# Patient Record
Sex: Female | Born: 1940
Health system: Southern US, Community
[De-identification: ages and names within clinical notes are randomized; demographics above are authoritative.]

## PROBLEM LIST (undated history)

## (undated) DIAGNOSIS — R42 Dizziness and giddiness: Secondary | ICD-10-CM

## (undated) DIAGNOSIS — J302 Other seasonal allergic rhinitis: Secondary | ICD-10-CM

## (undated) DIAGNOSIS — R251 Tremor, unspecified: Secondary | ICD-10-CM

## (undated) DIAGNOSIS — M1990 Unspecified osteoarthritis, unspecified site: Secondary | ICD-10-CM

## (undated) DIAGNOSIS — H8110 Benign paroxysmal vertigo, unspecified ear: Secondary | ICD-10-CM

## (undated) HISTORY — DX: Benign paroxysmal vertigo, unspecified ear: H81.10

## (undated) HISTORY — PX: OTHER SURGICAL HISTORY: SHX169

## (undated) HISTORY — PX: BACK SURGERY: SHX140

## (undated) HISTORY — DX: Tremor, unspecified: R25.1

---

## 1975-06-18 HISTORY — PX: ABDOMINAL HYSTERECTOMY: SHX81

## 1998-06-15 ENCOUNTER — Ambulatory Visit (HOSPITAL_COMMUNITY): Admission: RE | Admit: 1998-06-15 | Discharge: 1998-06-15 | Payer: Self-pay | Admitting: Family Medicine

## 1998-06-15 ENCOUNTER — Encounter: Payer: Self-pay | Admitting: Family Medicine

## 2000-05-20 ENCOUNTER — Other Ambulatory Visit: Admission: RE | Admit: 2000-05-20 | Discharge: 2000-05-20 | Payer: Self-pay | Admitting: Gynecology

## 2001-09-02 ENCOUNTER — Other Ambulatory Visit: Admission: RE | Admit: 2001-09-02 | Discharge: 2001-09-02 | Payer: Self-pay | Admitting: Gynecology

## 2002-09-13 ENCOUNTER — Other Ambulatory Visit: Admission: RE | Admit: 2002-09-13 | Discharge: 2002-09-13 | Payer: Self-pay | Admitting: Gynecology

## 2003-01-24 ENCOUNTER — Ambulatory Visit (HOSPITAL_COMMUNITY): Admission: RE | Admit: 2003-01-24 | Discharge: 2003-01-24 | Payer: Self-pay | Admitting: *Deleted

## 2003-09-14 ENCOUNTER — Other Ambulatory Visit: Admission: RE | Admit: 2003-09-14 | Discharge: 2003-09-14 | Payer: Self-pay | Admitting: Gynecology

## 2004-04-30 ENCOUNTER — Ambulatory Visit (HOSPITAL_BASED_OUTPATIENT_CLINIC_OR_DEPARTMENT_OTHER): Admission: RE | Admit: 2004-04-30 | Discharge: 2004-04-30 | Payer: Self-pay | Admitting: Orthopedic Surgery

## 2004-04-30 ENCOUNTER — Ambulatory Visit (HOSPITAL_COMMUNITY): Admission: RE | Admit: 2004-04-30 | Discharge: 2004-04-30 | Payer: Self-pay | Admitting: Orthopedic Surgery

## 2004-08-30 ENCOUNTER — Emergency Department (HOSPITAL_COMMUNITY): Admission: EM | Admit: 2004-08-30 | Discharge: 2004-08-30 | Payer: Self-pay | Admitting: Emergency Medicine

## 2004-09-17 ENCOUNTER — Other Ambulatory Visit: Admission: RE | Admit: 2004-09-17 | Discharge: 2004-09-17 | Payer: Self-pay | Admitting: Gynecology

## 2005-07-15 ENCOUNTER — Ambulatory Visit (HOSPITAL_BASED_OUTPATIENT_CLINIC_OR_DEPARTMENT_OTHER): Admission: RE | Admit: 2005-07-15 | Discharge: 2005-07-15 | Payer: Self-pay | Admitting: Orthopedic Surgery

## 2005-09-09 ENCOUNTER — Other Ambulatory Visit: Admission: RE | Admit: 2005-09-09 | Discharge: 2005-09-09 | Payer: Self-pay | Admitting: Gynecology

## 2006-09-29 ENCOUNTER — Other Ambulatory Visit: Admission: RE | Admit: 2006-09-29 | Discharge: 2006-09-29 | Payer: Self-pay | Admitting: Gynecology

## 2007-05-27 ENCOUNTER — Encounter
Admission: RE | Admit: 2007-05-27 | Discharge: 2007-05-27 | Payer: Self-pay | Admitting: Physical Medicine and Rehabilitation

## 2007-07-31 ENCOUNTER — Encounter (INDEPENDENT_AMBULATORY_CARE_PROVIDER_SITE_OTHER): Payer: Self-pay | Admitting: Orthopedic Surgery

## 2007-07-31 ENCOUNTER — Ambulatory Visit (HOSPITAL_BASED_OUTPATIENT_CLINIC_OR_DEPARTMENT_OTHER): Admission: RE | Admit: 2007-07-31 | Discharge: 2007-07-31 | Payer: Self-pay | Admitting: Orthopedic Surgery

## 2008-02-01 ENCOUNTER — Encounter: Admission: RE | Admit: 2008-02-01 | Discharge: 2008-02-01 | Payer: Self-pay | Admitting: Family Medicine

## 2008-04-06 ENCOUNTER — Encounter: Admission: RE | Admit: 2008-04-06 | Discharge: 2008-04-14 | Payer: Self-pay | Admitting: Obstetrics and Gynecology

## 2010-10-30 NOTE — Op Note (Signed)
NAME:  [name], [name]                  ACCOUNT NO.:  [id number]   MEDICAL RECORD NO.:  [id number]          PATIENT TYPE:  AMB   LOCATION:  DSC                          FACILITY:  [facility]   PHYSICIAN:  Katy Fitch. Sypher, M.D. DATE OF BIRTH:  03-13-1941   DATE OF PROCEDURE:  07/31/2007  DATE OF DISCHARGE:                               OPERATIVE REPORT   PREOPERATIVE DIAGNOSIS:  Chronic lytic nail deformity, right thumb, with  arthritis of interphalangeal joint and presumed chronic mucoid cyst.   POSTOPERATIVE DIAGNOSES:  1. Chronic lytic nail deformity with evidence of pyogenic      granuloma/ruptured mucoid cyst deep to nail fold causing damage to      germinal nail matrix and intermediate nail fold  2. Severe osteoarthritis of interphalangeal joints with marginal      osteophytes and synovitis.   OPERATION:  1. Removal of nail plate, debridement of ruptured mucoid cyst/pyogenic      granuloma and nail fold biopsy.  2. Interphalangeal joint debridement, removal of osteophytes and      synovectomy.   OPERATING SURGEON:  Josephine Igo, MD   ASSISTANT:  Annye Rusk, PA-C   ANESTHESIA:  Lidocaine 2% metacarpal head level block of right thumb  supplemented by IV sedation.   SUPERVISING ANESTHESIOLOGIST:  Adonis Huguenin, MD   INDICATIONS:  Marie Coleman is a 70 year old woman referred through the  courtesy of Dr. Amy Swaziland for evaluation of a chronic dystrophic right  thumb nail.   She had a history of degenerative arthritis of her right thumb IP joint  and development of mucoid cyst that caused damage to her germinal nail  matrix.  She was referred in the fall of 2008 by Dr. Swaziland and was  advised at that time that she had mucoid cyst causing her nail  deformity.  It appeared that she may never have resolution of her  deformity due to what appeared to be damage to the germinal nail matrix.   We offered debridement at that time.   She declined debridement, but then returned 3 months  later with a  ruptured cyst, marked hemosiderin staining of the nail plate, concern  about possible melanotic lesion development and requested an excisional  biopsy.   We brought her to the operating at this time, advising that we would  biopsy the nail fold to be certain that she does not have a melanotic  lesion and would also debride her ruptured mucoid cyst as well as the IP  joint in an effort to prevent further nail dystrophic changes and  damage.   After informed consent, she is brought to the operating room at this  time.   Preoperatively, she was carefully advised that we could not guarantee  that her nail would return to normal, nor could we offer any prospect of  improving her arthritis.  Our goal was to try to prevent chronic  infection of her IP joint and further nail damage.   PROCEDURE:  Marie Coleman was brought to the operating room and placed  in supine position upon the operating table.  Following light sedation,  the right arm was prepped with Betadine soaping solution and sterilely  draped.  Lidocaine 2% was infiltrated at the metacarpal head level to  obtain a digital block of the right thumb.   Following exsanguination of the right thumb with a gauze wrap, a 1/2-  inch Penrose drain was placed over the proximal phalangeal segment as a  digital tourniquet.   Procedure commenced with removal of the radial half of the nail plate;  this removed the dystrophic segment and the hemosiderin deposits.  A  pyogenic granuloma was noted deep to the nail plate.  There was a hole  through the germinal nail matrix measuring 3 x 4 mm, occupied by the  ruptured mucoid cyst material as well as a pyogenic granuloma; this was  curetted and removed for biopsy.  Portions of the dystrophic nail matrix  were also sent in formalin for biopsy.  The pyogenic granuloma was  thoroughly curetted, as was the sinus tract extending to the  interphalangeal joint.  A 2nd incision was  fashioned on the dorsoradial  aspect of the IP joint followed by arthrotomy with removal of the  capsule between the distal extensor tendon and the radial collateral  ligament.  The osteophytes on the radial aspect of the base of the  distal phalanx and proximal phalangeal head were removed, as well as a  large dorsal osteophyte of the proximal phalanx.  A synovectomy was  accomplished with a micro rongeur followed by irrigation of the joint  with a blunt dental needle.   Hemostasis was achieved followed by repair of the skin wound with a  series of interrupted 5-0 nylon sutures.   The nail fold was dressed with subdorsal nail fold Xeroflo and Xeroflo  over the incisions.   The biopsy specimen was sent for histopathologic examination.   The digital tourniquet was released with immediate capillary refill.  There were no apparent complications.   For aftercare, Ms. Nierenberg was provided a prescription for Vicodin 5 mg  one p.o. q.4-6 h. hours p.r.n. pain, 20 tablets without refill, also  Keflex 5 mg one p.o. q.8 h. for 4 days as a prophylactic antibiotic.   I will see her back for followup in approximately 1 week.   We will await the results of her biopsy.      Katy Fitch Sypher, M.D.  Electronically Signed     RVS/MEDQ  D:  07/31/2007  T:  08/02/2007  Job:  [id number]   cc:   Amy Y. Swaziland, M.D.

## 2010-11-02 NOTE — Op Note (Signed)
NAME:  Marie Coleman, Marie Coleman                  ACCOUNT NO.:  [id number]   MEDICAL RECORD NO.:  [id number]          PATIENT TYPE:  AMB   LOCATION:  DSC                          FACILITY:  [facility]   PHYSICIAN:  Harvie Junior, M.D.   DATE OF BIRTH:  1941/03/26   DATE OF PROCEDURE:  07/15/2005  DATE OF DISCHARGE:                                 OPERATIVE REPORT   PREOPERATIVE DIAGNOSIS:  Medial meniscal tear with known chondromalacia.   POSTOPERATIVE DIAGNOSES:  1.  Medial meniscal tear with grade 3 changes, medial femoral condyle.  2.  Chondromalacia, lateral femoral compartment.  3.  Chondromalacia, patellofemoral compartment.   OPERATION PERFORMED:  1.  Partial posterior horn medial meniscectomy with corresponding      debridement of medial femoral condyle and medial tibial plateau.  2.  Debridement of lateral femoral compartment, lateral weightbearing      compartment by way of chondroplasty.  3.  Debridement of chondromalacia patella in the patellofemoral compartment      by way of chondroplasty.   SURGEON:  Harvie Junior, M.D.   ASSISTANT:  Marshia Ly, P.A.   ANESTHESIA:  General.   INDICATIONS FOR PROCEDURE:  [name] is a 70 year old female with a long  history of having had previous knee arthroscopy with debridement of lateral  meniscus tear and chondromalacia of the patella.  She ultimately had gone on  to do reasonably well but most recently began having intermittent catching  and grabbing on the medial side and occasional locking. She had on exam  classic signs of medial meniscal tear.  We discussed treatment options and  ultimately felt that operative knee arthroscopy was the most appropriate  course of action and she is brought to the operating room for this  procedure.   DESCRIPTION OF PROCEDURE:  The patient was brought to the operating room  where after adequate anesthesia was obtained with general anesthetic,  patient was placed supine on the operating table.  Right  leg was prepped and  draped in the usual sterile fashion.  Following this, routine arthroscopic  examination of the knee revealed that there was an obvious posterior horn  medial meniscal tear with a radial type extending back to the rim.  This  tracked around posteriorly.  The posterior horn of the medial meniscus was  then debrided.  The remaining meniscal rim was contoured back with a suction  shaver.  Medial femoral compartment was evaluated and debrided back to a  smooth and stable rim.  Attention was then turned to the lateral femoral  compartment which was debrided back to a smooth and stable rim and both on  the lateral femoral condyle as well as the lateral tibial plateau.  Once  this area had been debrided fully, attention was then turned towards the  patellofemoral compartment where the grade 4 changes were identified and  debrided as well.  Patellar tracking looked midline.  There was some  tightness in the lateral side but given the significant nature of disease in  the knee it was felt that lateral retinacular release was not  appropriate.  At  this point the knee was copiously irrigated and suctioned dry.  The  arthroscopic portals were closed with a bandage.  A sterile compressive  dressing was applied.  The patient was taken to the recovery room where she  was noted to be in satisfactory condition.  The estimated blood loss for  this procedure was none.      Harvie Junior, M.D.  Electronically Signed     JLG/MEDQ  D:  07/15/2005  T:  07/16/2005  Job:  [id number]

## 2010-11-02 NOTE — Op Note (Signed)
NAME:  Marie Coleman, Marie Coleman                  ACCOUNT NO.:  [id number]   MEDICAL RECORD NO.:  [id number]          PATIENT TYPE:  AMB   LOCATION:  DSC                          FACILITY:  [facility]   PHYSICIAN:  Harvie Junior, M.D.   DATE OF BIRTH:  November 21, 1940   DATE OF PROCEDURE:  04/30/2004  DATE OF DISCHARGE:                                 OPERATIVE REPORT   PREOPERATIVE DIAGNOSES:  1.  Medial meniscal tear.  2.  Lateral meniscal tear.  3.  Chondromalacia tricompartmental.   POSTOPERATIVE DIAGNOSES:  1.  Medial and lateral meniscal tear.  2.  Chondromalacia patellofemoral joint.   OPERATION PERFORMED:  1.  Partial posterior horn medial meniscal tear.  2.  Partial lateral meniscectomy.  3.  Debridement of chondromalacia patellofemoral joint as well as lateral      femoral condyle.   SURGEON:  Harvie Junior, M.D.   ASSISTANT:  Marshia Ly, P.A.   ANESTHESIA:  General.   INDICATIONS FOR PROCEDURE:  She is a 70 year old female with a long history  of having knee pain, she ultimately was evaluated with MRI which showed that  she had lateral meniscal tear, medial meniscal tear.  Injection therapy has  not helped her much and because of continuing complaints of pain, she was  ultimately taken to the operating room for operative knee arthroscopy.   DESCRIPTION OF PROCEDURE:  The patient was taken to the operating room where  after adequate anesthesia was obtained with general anesthetic, the patient  was placed supine on operating table.  Left leg was prepped and draped in  the usual sterile fashion.  Following this, routine arthroscopic examination  of the knee revealed that there was an obvious undersurface posterior horn  medial meniscal tear.  This was debrided back to a smooth and stable rim.  Attention was turned to the medial femoral condyle which was not  dramatically damaged.  There were some grade 2 changes medial tibial plateau  grade 2 changes.  Attention was turned laterally  where was a large lateral  meniscal tear from the anterior horn all the way around to the midbody.  This was debrided with a suction shaver back to a stable rim which was  essentially the entire anterior horn lateral meniscus.  Really around to the  posterior horn.  The posterior horn was probed and felt to be stable.  The  lateral femoral condyle had grade 4 changes and grade 3 changes.  These were  debrided to a smooth rim.  Attention was then turned to the patellofemoral  joint which was debrided.  It also had some significant grade 4 changes on  the undersurface of the patella.  At this point the knee  was copiously irrigated and suctioned dry.  The arthroscopic portal was  closed with bandage.  Sterile compressive dressing was applied.  The patient  was taken to the recovery room where she was noted to be in satisfactory  condition.  The estimated blood loss for this procedure was none.       JLG/MEDQ  D:  04/30/2004  T:  04/30/2004  Job:  [id number]

## 2011-05-14 ENCOUNTER — Emergency Department (HOSPITAL_COMMUNITY): Payer: Medicare Other

## 2011-05-14 ENCOUNTER — Inpatient Hospital Stay (HOSPITAL_COMMUNITY)
Admission: EM | Admit: 2011-05-14 | Discharge: 2011-05-18 | DRG: 482 | Disposition: A | Discharge status: Skilled Nursing Facility | Payer: Medicare Other | Attending: Orthopedic Surgery | Admitting: Orthopedic Surgery

## 2011-05-14 ENCOUNTER — Encounter: Payer: Self-pay | Admitting: *Deleted

## 2011-05-14 DIAGNOSIS — S72009A Fracture of unspecified part of neck of unspecified femur, initial encounter for closed fracture: Principal | ICD-10-CM | POA: Diagnosis present

## 2011-05-14 DIAGNOSIS — Y9301 Activity, walking, marching and hiking: Secondary | ICD-10-CM

## 2011-05-14 DIAGNOSIS — Y92009 Unspecified place in unspecified non-institutional (private) residence as the place of occurrence of the external cause: Secondary | ICD-10-CM

## 2011-05-14 DIAGNOSIS — S72001A Fracture of unspecified part of neck of right femur, initial encounter for closed fracture: Secondary | ICD-10-CM | POA: Diagnosis present

## 2011-05-14 DIAGNOSIS — W010XXA Fall on same level from slipping, tripping and stumbling without subsequent striking against object, initial encounter: Secondary | ICD-10-CM | POA: Diagnosis present

## 2011-05-14 DIAGNOSIS — Z7982 Long term (current) use of aspirin: Secondary | ICD-10-CM

## 2011-05-14 LAB — CBC
HCT: 40 % (ref 36.0–46.0)
MCH: 30.1 pg (ref 26.0–34.0)
MCH: 30 pg (ref 26.0–34.0)
MCHC: 33.3 g/dL (ref 30.0–36.0)
MCHC: 33.2 g/dL (ref 30.0–36.0)
MCV: 90.5 fL (ref 78.0–100.0)
MCV: 90.6 fL (ref 78.0–100.0)
Platelets: 224 10*3/uL (ref 150–400)
Platelets: 192 10*3/uL (ref 150–400)
RBC: 4.03 MIL/uL (ref 3.87–5.11)
RDW: 12.9 % (ref 11.5–15.5)

## 2011-05-14 LAB — APTT: aPTT: 28 seconds (ref 24–37)

## 2011-05-14 LAB — URINALYSIS, ROUTINE W REFLEX MICROSCOPIC
Glucose, UA: NEGATIVE mg/dL
Leukocytes, UA: NEGATIVE
Nitrite: NEGATIVE
Specific Gravity, Urine: 1.007 (ref 1.005–1.030)
pH: 7 (ref 5.0–8.0)

## 2011-05-14 LAB — BASIC METABOLIC PANEL
BUN: 17 mg/dL (ref 6–23)
CO2: 25 mEq/L (ref 19–32)
Calcium: 9.1 mg/dL (ref 8.4–10.5)
Chloride: 102 mEq/L (ref 96–112)
Creatinine, Ser: 0.55 mg/dL (ref 0.50–1.10)
Glucose, Bld: 97 mg/dL (ref 70–99)

## 2011-05-14 LAB — COMPREHENSIVE METABOLIC PANEL
ALT: 9 U/L (ref 0–35)
Albumin: 3.1 g/dL — ABNORMAL LOW (ref 3.5–5.2)
BUN: 11 mg/dL (ref 6–23)
Calcium: 8.3 mg/dL — ABNORMAL LOW (ref 8.4–10.5)
GFR calc Af Amer: >90 mL/min (ref >90)
Glucose, Bld: 124 mg/dL — ABNORMAL HIGH (ref 70–99)
Potassium: 4.1 mEq/L (ref 3.5–5.1)
Sodium: 139 mEq/L (ref 135–145)
Total Protein: 6.4 g/dL (ref 6.0–8.3)

## 2011-05-14 LAB — URINE MICROSCOPIC-ADD ON

## 2011-05-14 LAB — ABO/RH: ABO/RH(D): O POS

## 2011-05-14 LAB — TYPE AND SCREEN: ABO/RH(D): O POS

## 2011-05-14 LAB — DIFFERENTIAL
Basophils Relative: 0 % (ref 0–1)
Eosinophils Absolute: 0.1 10*3/uL (ref 0.0–0.7)
Eosinophils Relative: 1 % (ref 0–5)
Lymphs Abs: 1.2 10*3/uL (ref 0.7–4.0)
Neutrophils Relative %: 75 % (ref 43–77)

## 2011-05-14 MED ORDER — SODIUM CHLORIDE 0.9 % IV SOLN
Freq: Once | INTRAVENOUS | Status: AC
  Administered 2011-05-14: 13:00:00 via INTRAVENOUS

## 2011-05-14 MED ORDER — MORPHINE SULFATE 4 MG/ML IJ SOLN: 3.0000 mg | INTRAMUSCULAR | Status: DC | PRN

## 2011-05-14 MED ORDER — CEFAZOLIN SODIUM 1 G IJ SOLR
1.0000 g | Freq: Once | INTRAMUSCULAR | Status: DC
  Filled 2011-05-14: qty 10

## 2011-05-14 MED ORDER — SODIUM CHLORIDE 0.9 % IV SOLN: INTRAVENOUS | Status: DC

## 2011-05-14 MED ORDER — METHOCARBAMOL 500 MG PO TABS
500.0000 mg | ORAL_TABLET | Freq: Three times a day (TID) | ORAL | Status: DC | PRN
  Administered 2011-05-14: 500 mg via ORAL
  Filled 2011-05-14: qty 1

## 2011-05-14 MED ORDER — ONDANSETRON HCL 4 MG/2ML IJ SOLN
4.0000 mg | Freq: Three times a day (TID) | INTRAMUSCULAR | Status: AC | PRN
  Administered 2011-05-14: 4 mg via INTRAVENOUS
  Filled 2011-05-14: qty 2

## 2011-05-14 MED ORDER — MORPHINE SULFATE 4 MG/ML IJ SOLN
4.0000 mg | Freq: Once | INTRAMUSCULAR | Status: AC
  Administered 2011-05-14: 2 mg via INTRAVENOUS
  Filled 2011-05-14: qty 1

## 2011-05-14 MED ORDER — METHOCARBAMOL 100 MG/ML IJ SOLN
500.0000 mg | Freq: Three times a day (TID) | INTRAVENOUS | Status: DC | PRN
  Filled 2011-05-14: qty 5

## 2011-05-14 MED ORDER — MORPHINE SULFATE 4 MG/ML IJ SOLN
4.0000 mg | Freq: Once | INTRAMUSCULAR | Status: AC
  Administered 2011-05-14: 4 mg via INTRAVENOUS
  Filled 2011-05-14: qty 1

## 2011-05-14 MED ORDER — CHLORHEXIDINE GLUCONATE 4 % EX LIQD
60.0000 mL | Freq: Once | CUTANEOUS | Status: DC
  Filled 2011-05-14: qty 60

## 2011-05-14 NOTE — ED Notes (Signed)
Continues to rest quietly in bed. Resp even and unlabored. Pain and nausea controlled at this time. Family at bedside. Awaiting admission.

## 2011-05-14 NOTE — ED Notes (Signed)
Resting quietly in bed. Daughter reamins at bedside. Resp even and unlabored. Pt states she is comfortable at this time. Foley in place. Draining clear yellow urine. Awaiting admission.

## 2011-05-14 NOTE — ED Notes (Signed)
Pt c/o R groin and lower back tenderness d/t fall approx 0915 this am, pt sts she was walking into the garage and did not notice the garage floor was wet, slipped and fell landing on R hip. No shortening or rotation noted, pt refuses pain medication at this time, sts "it's not so much pain as it is tenderness." nad, will continue to monitor.

## 2011-05-14 NOTE — ED Notes (Signed)
Patient states she was outside had flipflops and the garage was wet she slipped and fell c/o pain in her right groin. No shortening or external rotation to legs. Bilateral pedal pulses present. States she was able to ambulate with some pain.

## 2011-05-14 NOTE — ED Notes (Addendum)
EDP Tucich at bedside. 

## 2011-05-14 NOTE — ED Notes (Signed)
EDP Tucich at bedside.

## 2011-05-14 NOTE — ED Notes (Signed)
Patient transported to X-ray 

## 2011-05-14 NOTE — ED Notes (Signed)
Foley bag emptied. output noted.

## 2011-05-14 NOTE — ED Notes (Signed)
Spoke with Dr. Juliene Pina concerning pt. He stated she can have regular diet until midnight. Also stated to have ER md here evaluate and treat pain as needed.

## 2011-05-14 NOTE — ED Notes (Signed)
Placed call to [facility] for transport to [employer]

## 2011-05-14 NOTE — ED Notes (Signed)
Family at bedside. 

## 2011-05-14 NOTE — ED Notes (Signed)
Pt remains A&Ox3. Resp even and unlabored. Pt denies pain at this time. NS infusing at 75ml/hr. Husband at bedside. Awaiting admission. NAD noted.

## 2011-05-14 NOTE — ED Notes (Signed)
Pt complaining of increased R hip pain 6/10. Md notified. New pain med ordered and administered.

## 2011-05-14 NOTE — H&P (Signed)
Marie Coleman is an 70 y.o. female.   Chief Complaint: Right hip pain HPI: Patient states she just slipped and fell earlier today . She states she landed on her right hip and sustained no other injury. Denies LOC, SOB, chest pain or dizziness.   History reviewed. No pertinent past medical history.  History reviewed. No pertinent past surgical history. History of left shoulder rotator cuff surgery, neck, back and hysterectomy.  History reviewed. No pertinent family history. Social History:  reports that she has never smoked. She does not have any smokeless tobacco history on file. She reports that she does not drink alcohol or use illicit drugs.  Allergies:  Allergies  Allergen Reactions  . Sulfa Antibiotics Hives    Medications Prior to Admission  Medication Dose Route Frequency Provider Last Rate Last Dose  . 0.9 %  sodium chloride infusion   Intravenous Once Peter A. Tucich, MD 75 mL/hr at 05/14/11 1255    . 0.9 %  sodium chloride infusion   Intravenous STAT Peter A. Tucich, MD      . morphine 4 MG/ML injection 4 mg  4 mg Intravenous Once Peter A. Patrica Duel, MD   2 mg at 05/14/11 1255  . morphine 4 MG/ML injection 4 mg  4 mg Intravenous Once Peter A. Patrica Duel, MD   4 mg at 05/14/11 1506  . ondansetron (ZOFRAN) injection 4 mg  4 mg Intravenous Q8H PRN Peter A. Patrica Duel, MD       No current outpatient prescriptions on file as of 05/14/2011.    Results for orders placed during the hospital encounter of 05/14/11 (from the past 48 hour(s))  BASIC METABOLIC PANEL     Status: Normal   Collection Time   05/14/11 12:15 PM      Component Value Range Comment   Sodium 137  135 - 145 (mEq/L)    Potassium 4.0  3.5 - 5.1 (mEq/L)    Chloride 102  96 - 112 (mEq/L)    CO2 25  19 - 32 (mEq/L)    Glucose, Bld 97  70 - 99 (mg/dL)    BUN 17  6 - 23 (mg/dL)    Creatinine, Ser 1.61  0.50 - 1.10 (mg/dL)    Calcium 9.1  8.4 - 10.5 (mg/dL)    GFR calc non Af Amer >90  >90 (mL/min)    GFR calc Af Amer >90   >90 (mL/min)   CBC     Status: Abnormal   Collection Time   05/14/11 12:15 PM      Component Value Range Comment   WBC 11.0 (*) 4.0 - 10.5 (K/uL)    RBC 4.42  3.87 - 5.11 (MIL/uL)    Hemoglobin 13.3  12.0 - 15.0 (g/dL)    HCT 09.6  04.5 - 40.9 (%)    MCV 90.5  78.0 - 100.0 (fL)    MCH 30.1  26.0 - 34.0 (pg)    MCHC 33.3  30.0 - 36.0 (g/dL)    RDW 81.1  91.4 - 78.2 (%)    Platelets 224  150 - 400 (K/uL)   PROTIME-INR     Status: Normal   Collection Time   05/14/11 12:15 PM      Component Value Range Comment   Prothrombin Time 13.3  11.6 - 15.2 (seconds)    INR 0.99  0.00 - 1.49    TYPE AND SCREEN     Status: Normal   Collection Time   05/14/11 12:15 PM  Component Value Range Comment   ABO/RH(D) O POS      Antibody Screen NEG      Sample Expiration 05/17/2011     APTT     Status: Normal   Collection Time   05/14/11 12:15 PM      Component Value Range Comment   aPTT 28  24 - 37 (seconds)   ABO/RH     Status: Normal   Collection Time   05/14/11 12:15 PM      Component Value Range Comment   ABO/RH(D) O POS      Dg Chest 2 View  05/14/2011  *RADIOLOGY REPORT*  Clinical Data: Right hip fracture, preop.  CHEST - 2 VIEW  Comparison: None  Findings: Heart and mediastinal contours are within normal limits. No focal opacities or effusions.  No acute bony abnormality.  IMPRESSION: No active cardiopulmonary disease.  Original Report Authenticated By: Cyndie Chime, M.D.   Dg Hip Complete Right  05/14/2011  *RADIOLOGY REPORT*  Clinical Data: Fall, right hip pain.  RIGHT HIP - COMPLETE 2+ VIEW  Comparison: None.  Findings: There is a right femoral neck fracture with slight valgus angulation.  No dislocation.  Left hip is unremarkable.  Remainder bony pelvis unremarkable.  Degenerative changes in the lower lumbar spine.  IMPRESSION: Right femoral neck fracture with slight valgus angulation.  Original Report Authenticated By: Cyndie Chime, M.D.    Review of Systems    Constitutional: Negative for fever, chills, weight loss, malaise/fatigue and diaphoresis.  Eyes: Negative.   Respiratory: Negative.   Cardiovascular: Negative.   Gastrointestinal: Negative.   Genitourinary: Negative.   Musculoskeletal: Positive for joint pain.  Skin: Negative for itching and rash.  Neurological: Negative.  Negative for weakness.  Endo/Heme/Allergies: Negative.   Psychiatric/Behavioral: Negative.   .   Blood pressure 130/67, pulse 76, temperature 98.4 F (36.9 C), temperature source Oral, resp. rate 18, SpO2 100.00%. Physical Exam  Constitutional: She is oriented to person, place, and time. She appears well-developed.  HENT:  Head: Normocephalic and atraumatic.  Eyes: EOM are normal.  Cardiovascular: Normal rate, regular rhythm and intact distal pulses.   Respiratory: Effort normal and breath sounds normal.  GI: Soft. Bowel sounds are normal.  Musculoskeletal: She exhibits tenderness (Right hip and left knee).       Left hip GROM causes no pain. Full range of motion Bilateral  Knees without pain. Bilateral lower legs non tender to palpation. Bilateral feet and ankles non tender.  Neurological: She is oriented to person, place, and time.  Skin: Skin is warm and dry.  Psychiatric: She has a normal mood and affect.     Assessment/Plan 70 year old healthy female status post fall resulting in right femoral neck fracture. Patient to be admited to Dr. Lestine Box service for right femoral neck fracture. Dr. Darrelyn Hillock to perform cannulated pinning of right hip fracture on 05/15/11 at [facility]. Strict bedrest , non wieght bearing right lower extremity.  Patient NPO after mid night. Patient to receive 1 gram ancef 1 hour pre-op.   Marie Coleman W. 05/14/2011, 4:44 PM

## 2011-05-14 NOTE — ED Provider Notes (Addendum)
History     CSN: [id number] Arrival date & time: 05/14/2011 10:27 AM   First MD Initiated Contact with Patient 05/14/11 1034      Chief Complaint  Patient presents with  . Fall   and fell in her carotids she after walking in from the driveway. She states she was in her flip flops in the driver's floor was wet. She complains of diffuse pain mostly in the right groin and some in the right hip. She did not injure her head or neck. Denies any preceding symptoms. She has some mild low back pain, but denies any numbness, weakness or tingling. She was apparently able to ambulate with some pain. Family states she is known degrees per orthopedics and did contact them prior to coming in. No medications were given prior to arrival (Consider location/radiation/quality/duration/timing/severity/associated sxs/prior treatment) HPI  History reviewed. No pertinent past medical history.  History reviewed. No pertinent past surgical history.  History reviewed. No pertinent family history.  History  Substance Use Topics  . Smoking status: Never Smoker   . Smokeless tobacco: Not on file  . Alcohol Use: No    OB History    Grav Para Term Preterm Abortions TAB SAB Ect Mult Living                  Review of Systems  All other systems reviewed and are negative.    Allergies  Sulfa antibiotics  Home Medications   Current Outpatient Rx  Name Route Sig Dispense Refill  . ASPIRIN 81 MG PO TABS Oral Take 81 mg by mouth daily.        BP 130/67  Pulse 76  Temp(Src) 98.4 F (36.9 C) (Oral)  Resp 18  SpO2 100%  Physical Exam  Constitutional: She is oriented to person, place, and time. She appears well-developed and well-nourished.  HENT:  Head: Normocephalic and atraumatic.  Eyes: Conjunctivae and EOM are normal. Pupils are equal, round, and reactive to light.  Neck: Neck supple.  Cardiovascular: Normal rate and regular rhythm.  Exam reveals no gallop and no friction rub.   No murmur  heard. Pulmonary/Chest: Breath sounds normal. She has no wheezes. She has no rales. She exhibits no tenderness.  Abdominal: Soft. Bowel sounds are normal. She exhibits no distension. There is no tenderness. There is no rebound and no guarding.  Musculoskeletal: Normal range of motion.       Oh obvious leg length discrepancy. Diffuse tenderness throughout the right  Neurological: She is alert and oriented to person, place, and time. No cranial nerve deficit. Coordination normal.  Skin: Skin is warm and dry. No rash noted.  Psychiatric: She has a normal mood and affect.    ED Course  Procedures (including critical care time)  Labs Reviewed - No data to display No results found.   No diagnosis found.    MDM  Pt is seen and examined;  Initial history and physical completed.  Will follow.          Aliegha Paullin A. Patrica Duel, MD 05/14/11 1045   Date: 05/14/2011  Rate: 76  Rhythm: normal sinus rhythm  QRS Axis: normal  Intervals: short PR interval  ST/T Wave abnormalities: normal  Conduction Disutrbances:none  Narrative Interpretation:   Old EKG Reviewed: none available    Destini Cambre A. Patrica Duel, MD 05/14/11 1159

## 2011-05-14 NOTE — ED Notes (Signed)
Patient denies pain and is resting comfortably.  

## 2011-05-14 NOTE — ED Notes (Addendum)
Pt complainig of nausea. Medicated as listed. Pillow provided per pt request. Foley bag emptied. output noted.

## 2011-05-14 NOTE — ED Notes (Signed)
Pt requested muscle relaxer prior to ambulance transport. MEdicated as listed

## 2011-05-15 ENCOUNTER — Inpatient Hospital Stay (HOSPITAL_COMMUNITY): Payer: Medicare Other | Admitting: Anesthesiology

## 2011-05-15 ENCOUNTER — Encounter (HOSPITAL_COMMUNITY): Payer: Self-pay | Admitting: Anesthesiology

## 2011-05-15 ENCOUNTER — Encounter (HOSPITAL_COMMUNITY)
Admission: EM | Disposition: A | Discharge status: Skilled Nursing Facility | Payer: Self-pay | Source: Home / Self Care | Attending: Orthopedic Surgery

## 2011-05-15 ENCOUNTER — Inpatient Hospital Stay (HOSPITAL_COMMUNITY): Payer: Medicare Other

## 2011-05-15 HISTORY — PX: HIP PINNING,CANNULATED: SHX1758

## 2011-05-15 LAB — SURGICAL PCR SCREEN
MRSA, PCR: POSITIVE — AB
Staphylococcus aureus: POSITIVE — AB

## 2011-05-15 SURGERY — FIXATION, FEMUR, NECK, PERCUTANEOUS, USING SCREW
Anesthesia: General | Site: Hip | Laterality: Right | Wound class: Clean

## 2011-05-15 MED ORDER — SODIUM CHLORIDE 0.9 % IR SOLN
Status: DC | PRN
  Administered 2011-05-15: 1000 mL

## 2011-05-15 MED ORDER — HYDROCODONE-ACETAMINOPHEN 5-325 MG PO TABS
1.0000 | ORAL_TABLET | ORAL | Status: DC | PRN
  Administered 2011-05-16 – 2011-05-17 (×6): 1 via ORAL
  Filled 2011-05-15 (×5): qty 1
  Filled 2011-05-15: qty 2

## 2011-05-15 MED ORDER — HEPARIN SODIUM (PORCINE) 5000 UNIT/ML IJ SOLN
5000.0000 [IU] | Freq: Two times a day (BID) | INTRAMUSCULAR | Status: DC
  Filled 2011-05-15: qty 1

## 2011-05-15 MED ORDER — LACTATED RINGERS IV SOLN
INTRAVENOUS | Status: DC | PRN
  Administered 2011-05-15 (×2): via INTRAVENOUS

## 2011-05-15 MED ORDER — WARFARIN VIDEO: Freq: Once | Status: DC

## 2011-05-15 MED ORDER — MENTHOL 3 MG MT LOZG
1.0000 | LOZENGE | OROMUCOSAL | Status: DC | PRN
  Filled 2011-05-15: qty 9

## 2011-05-15 MED ORDER — METHOCARBAMOL 100 MG/ML IJ SOLN
500.0000 mg | Freq: Four times a day (QID) | INTRAVENOUS | Status: DC | PRN
  Filled 2011-05-15: qty 5

## 2011-05-15 MED ORDER — LIDOCAINE HCL (CARDIAC) 20 MG/ML IV SOLN
INTRAVENOUS | Status: DC | PRN
  Administered 2011-05-15: 20 mg via INTRAVENOUS

## 2011-05-15 MED ORDER — SUCCINYLCHOLINE CHLORIDE 20 MG/ML IJ SOLN
INTRAMUSCULAR | Status: DC | PRN
  Administered 2011-05-15: 100 mg via INTRAVENOUS

## 2011-05-15 MED ORDER — HYDROMORPHONE HCL PF 1 MG/ML IJ SOLN: 0.2500 mg | INTRAMUSCULAR | Status: DC | PRN

## 2011-05-15 MED ORDER — LACTATED RINGERS IV SOLN
INTRAVENOUS | Status: DC
  Administered 2011-05-15 – 2011-05-16 (×2): via INTRAVENOUS

## 2011-05-15 MED ORDER — CEFAZOLIN SODIUM 1-5 GM-% IV SOLN
1.0000 g | Freq: Four times a day (QID) | INTRAVENOUS | Status: AC
  Administered 2011-05-15 – 2011-05-16 (×3): 1 g via INTRAVENOUS
  Filled 2011-05-15 (×3): qty 50

## 2011-05-15 MED ORDER — ACETAMINOPHEN 10 MG/ML IV SOLN
INTRAVENOUS | Status: DC | PRN
  Administered 2011-05-15: 1000 mg via INTRAVENOUS

## 2011-05-15 MED ORDER — LACTATED RINGERS IV SOLN: INTRAVENOUS | Status: DC

## 2011-05-15 MED ORDER — CEFAZOLIN SODIUM 1-5 GM-% IV SOLN: 1.0000 g | Freq: Once | INTRAVENOUS | Status: DC

## 2011-05-15 MED ORDER — ONDANSETRON HCL 4 MG/2ML IJ SOLN: 4.0000 mg | Freq: Four times a day (QID) | INTRAMUSCULAR | Status: DC | PRN

## 2011-05-15 MED ORDER — ONDANSETRON HCL 4 MG/2ML IJ SOLN
INTRAMUSCULAR | Status: DC | PRN
  Administered 2011-05-15 (×2): 2 mg via INTRAVENOUS

## 2011-05-15 MED ORDER — CISATRACURIUM BESYLATE 2 MG/ML IV SOLN
INTRAVENOUS | Status: DC | PRN
  Administered 2011-05-15: 4 mg via INTRAVENOUS

## 2011-05-15 MED ORDER — PROPOFOL 10 MG/ML IV EMUL
INTRAVENOUS | Status: DC | PRN
  Administered 2011-05-15: 120 mg via INTRAVENOUS

## 2011-05-15 MED ORDER — HYDROMORPHONE HCL PF 1 MG/ML IJ SOLN: 0.5000 mg | INTRAMUSCULAR | Status: DC | PRN

## 2011-05-15 MED ORDER — SODIUM CHLORIDE 0.9 % IR SOLN: Status: DC | PRN

## 2011-05-15 MED ORDER — ONDANSETRON HCL 4 MG PO TABS: 4.0000 mg | ORAL_TABLET | Freq: Four times a day (QID) | ORAL | Status: DC | PRN

## 2011-05-15 MED ORDER — FENTANYL CITRATE 0.05 MG/ML IJ SOLN
INTRAMUSCULAR | Status: DC | PRN
  Administered 2011-05-15 (×3): 25 ug via INTRAVENOUS
  Administered 2011-05-15: 50 ug via INTRAVENOUS
  Administered 2011-05-15: 25 ug via INTRAVENOUS

## 2011-05-15 MED ORDER — CEFAZOLIN SODIUM 1-5 GM-% IV SOLN
INTRAVENOUS | Status: DC | PRN
  Administered 2011-05-15: 1 g via INTRAVENOUS

## 2011-05-15 MED ORDER — PROMETHAZINE HCL 25 MG/ML IJ SOLN: 6.2500 mg | INTRAMUSCULAR | Status: DC | PRN

## 2011-05-15 MED ORDER — WARFARIN SODIUM 5 MG PO TABS
5.0000 mg | ORAL_TABLET | Freq: Once | ORAL | Status: AC
  Administered 2011-05-15: 5 mg via ORAL
  Filled 2011-05-15: qty 1

## 2011-05-15 MED ORDER — CHLORHEXIDINE GLUCONATE CLOTH 2 % EX PADS: 6.0000 | MEDICATED_PAD | Freq: Every day | CUTANEOUS | Status: DC

## 2011-05-15 MED ORDER — BACITRACIN ZINC 500 UNIT/GM EX OINT
TOPICAL_OINTMENT | CUTANEOUS | Status: AC
  Filled 2011-05-15: qty 15

## 2011-05-15 MED ORDER — EPHEDRINE SULFATE 50 MG/ML IJ SOLN
INTRAMUSCULAR | Status: DC | PRN
  Administered 2011-05-15 (×2): 5 mg via INTRAVENOUS

## 2011-05-15 MED ORDER — MUPIROCIN 2 % EX OINT
1.0000 "application " | TOPICAL_OINTMENT | Freq: Two times a day (BID) | CUTANEOUS | Status: DC
  Administered 2011-05-15 – 2011-05-17 (×6): 1 via NASAL
  Filled 2011-05-15 (×2): qty 22

## 2011-05-15 MED ORDER — NEOSTIGMINE METHYLSULFATE 1 MG/ML IJ SOLN
INTRAMUSCULAR | Status: DC | PRN
  Administered 2011-05-15: 2 mg via INTRAVENOUS

## 2011-05-15 MED ORDER — PHENOL 1.4 % MT LIQD: 1.0000 | OROMUCOSAL | Status: DC | PRN

## 2011-05-15 MED ORDER — BISACODYL 10 MG RE SUPP: 10.0000 mg | Freq: Every day | RECTAL | Status: DC | PRN

## 2011-05-15 MED ORDER — ACETAMINOPHEN 10 MG/ML IV SOLN
INTRAVENOUS | Status: AC
  Filled 2011-05-15: qty 100

## 2011-05-15 MED ORDER — METHOCARBAMOL 500 MG PO TABS
500.0000 mg | ORAL_TABLET | Freq: Four times a day (QID) | ORAL | Status: DC | PRN
  Administered 2011-05-16 – 2011-05-17 (×4): 500 mg via ORAL
  Filled 2011-05-15 (×4): qty 1

## 2011-05-15 MED ORDER — ALUM & MAG HYDROXIDE-SIMETH 200-200-20 MG/5ML PO SUSP: 30.0000 mL | ORAL | Status: DC | PRN

## 2011-05-15 MED ORDER — GLYCOPYRROLATE 0.2 MG/ML IJ SOLN
INTRAMUSCULAR | Status: DC | PRN
  Administered 2011-05-15: .3 mg via INTRAVENOUS

## 2011-05-15 MED ORDER — HEPARIN SODIUM (PORCINE) 5000 UNIT/ML IJ SOLN
5000.0000 [IU] | Freq: Two times a day (BID) | INTRAMUSCULAR | Status: DC
  Administered 2011-05-16 – 2011-05-18 (×5): 5000 [IU] via SUBCUTANEOUS
  Filled 2011-05-15 (×8): qty 1

## 2011-05-15 MED ORDER — COUMADIN BOOK
Freq: Once | Status: AC
  Administered 2011-05-15: 22:00:00
  Filled 2011-05-15: qty 1

## 2011-05-15 MED ORDER — BACITRACIN ZINC 500 UNIT/GM EX OINT
TOPICAL_OINTMENT | CUTANEOUS | Status: DC | PRN
  Administered 2011-05-15: 1 via TOPICAL

## 2011-05-15 MED ORDER — BISACODYL 5 MG PO TBEC: 10.0000 mg | DELAYED_RELEASE_TABLET | Freq: Every day | ORAL | Status: DC | PRN

## 2011-05-15 SURGICAL SUPPLY — 44 items
BAG SPEC THK2 15X12 ZIP CLS (MISCELLANEOUS)
BAG ZIPLOCK 12X15 (MISCELLANEOUS) ×1 IMPLANT
BANDAGE GAUZE ELAST BULKY 4 IN (GAUZE/BANDAGES/DRESSINGS) ×2 IMPLANT
BIT DRILL 5 ACE CANN QC (BIT) ×1 IMPLANT
BIT DRILL TWIST 3.8 (BIT) ×1 IMPLANT
CLOTH BEACON ORANGE TIMEOUT ST (SAFETY) ×2 IMPLANT
DRAPE STERI IOBAN 125X83 (DRAPES) ×2 IMPLANT
DRSG EMULSION OIL 3X16 NADH (GAUZE/BANDAGES/DRESSINGS) ×2 IMPLANT
DRSG PAD ABDOMINAL 8X10 ST (GAUZE/BANDAGES/DRESSINGS) ×4 IMPLANT
DURAPREP 26ML APPLICATOR (WOUND CARE) ×2 IMPLANT
ELECT REM PT RETURN 9FT ADLT (ELECTROSURGICAL) ×2
ELECTRODE REM PT RTRN 9FT ADLT (ELECTROSURGICAL) ×1 IMPLANT
GLOVE BIOGEL PI IND STRL 8.5 (GLOVE) ×1 IMPLANT
GLOVE BIOGEL PI INDICATOR 8.5 (GLOVE)
GLOVE ECLIPSE 8.0 STRL XLNG CF (GLOVE) ×3 IMPLANT
GLOVE INDICATOR 8.0 STRL GRN (GLOVE) ×2 IMPLANT
GOWN PREVENTION PLUS LG XLONG (DISPOSABLE) ×4 IMPLANT
GOWN STRL REIN XL XLG (GOWN DISPOSABLE) ×4 IMPLANT
MANIFOLD NEPTUNE II (INSTRUMENTS) ×2 IMPLANT
NS IRRIG 1000ML POUR BTL (IV SOLUTION) ×2 IMPLANT
PACK GENERAL/GYN (CUSTOM PROCEDURE TRAY) ×2 IMPLANT
PAD CAST 4YDX4 CTTN HI CHSV (CAST SUPPLIES) ×1 IMPLANT
PADDING CAST COTTON 4X4 STRL (CAST SUPPLIES) ×2
PIN GUIDE ACE (PIN) ×3 IMPLANT
PIN THREADED GUIDE ACE (PIN) ×3 IMPLANT
POSITIONER SURGICAL ARM (MISCELLANEOUS) ×3 IMPLANT
SCREW CANN 6.5 75MM (Screw) ×2 IMPLANT
SCREW CANN 6.5 80MM (Screw) ×2 IMPLANT
SCREW CANN 6.5 90MM (Screw) ×2 IMPLANT
SCREW CANN LG 6.5 FLT 75X22 (Screw) IMPLANT
SCREW CANN LG 6.5 FLT 80X22 (Screw) IMPLANT
SCREW CANN LG 6.5 FLT 90X22 (Screw) IMPLANT
SPONGE GAUZE 4X4 12PLY (GAUZE/BANDAGES/DRESSINGS) ×2 IMPLANT
SPONGE GAUZE 4X4 FOR O.R. (GAUZE/BANDAGES/DRESSINGS) ×1 IMPLANT
SPONGE LAP 18X18 X RAY DECT (DISPOSABLE) ×1 IMPLANT
STAPLER VISISTAT 35W (STAPLE) ×2 IMPLANT
SUT VIC AB 1 CT1 27 (SUTURE) ×4
SUT VIC AB 1 CT1 27XBRD ANTBC (SUTURE) ×2 IMPLANT
SUT VIC AB 2-0 CT1 27 (SUTURE) ×4
SUT VIC AB 2-0 CT1 27XBRD (SUTURE) ×2 IMPLANT
TAPE HYPAFIX 6X30 (GAUZE/BANDAGES/DRESSINGS) ×1 IMPLANT
TOWEL OR 17X26 10 PK STRL BLUE (TOWEL DISPOSABLE) ×4 IMPLANT
TRAY FOLEY CATH 14FRSI W/METER (CATHETERS) IMPLANT
WATER STERILE IRR 1500ML POUR (IV SOLUTION) ×2 IMPLANT

## 2011-05-15 NOTE — Transfer of Care (Signed)
Immediate Anesthesia Transfer of Care Note  Patient: Marie Coleman  Procedure(s) Performed:  CANNULATED HIP PINNING - Cannulated Right Hip Pinning   (c-arm)   Patient Location: PACU  Anesthesia Type: General  Level of Consciousness: awake, oriented, patient cooperative and responds to stimulation  Airway & Oxygen Therapy: Patient Spontanous Breathing and Patient connected to face mask oxygen  Post-op Assessment: Report given to PACU RN, Post -op Vital signs reviewed and stable and Patient moving all extremities  Post vital signs: Reviewed and stable  Complications: No apparent anesthesia complications

## 2011-05-15 NOTE — Progress Notes (Signed)
ANTICOAGULATION CONSULT NOTE - Initial Consult  Pharmacy Consult for Coumadin Indication: R hip fx repair  Allergies  Allergen Reactions  . Sulfa Antibiotics Hives    Patient Measurements: Height: 5\' 6"  (167.6 cm) Weight: 120 lb (54.432 kg) IBW/kg (Calculated) : 59.3  Adjusted Body Weight:   Vital Signs: Temp: 97.6 F (36.4 C) (11/28 2101) Temp src: Oral (11/28 1400) BP: 112/68 mmHg (11/28 2101) Pulse Rate: 69  (11/28 2101)  Labs:  Basename 05/14/11 1950 05/14/11 1215  HGB 12.1 13.3  HCT 36.5 40.0  PLT 192 224  APTT -- 28  LABPROT -- 13.3  INR -- 0.99  HEPARINUNFRC -- --  CREATININE 0.55 0.55  CKTOTAL -- --  CKMB -- --  TROPONINI -- --   Estimated Creatinine Clearance: 56.2 ml/min (by C-G formula based on Cr of 0.55).  Medical History: History reviewed. No pertinent past medical history.  Medications:  Prescriptions prior to admission  Medication Sig Dispense Refill  . cholecalciferol (VITAMIN D) 1000 UNITS tablet Take 1,000 Units by mouth daily.        Marland Kitchen estradiol (VIVELLE-DOT) 0.075 MG/24HR Place 1 patch onto the skin 2 (two) times a week.        . fexofenadine (ALLEGRA) 180 MG tablet Take 180 mg by mouth daily as needed. For allergies       . ibuprofen (ADVIL,MOTRIN) 200 MG tablet Take 200 mg by mouth every 6 (six) hours as needed. For pain       . OVER THE COUNTER MEDICATION Take 1 Can by mouth daily. Joint Juice       . DISCONTD: aspirin 81 MG tablet Take 81 mg by mouth daily.         Scheduled:    . ceFAZolin (ANCEF) IV  1 g Intravenous Q6H  . heparin  5,000 Units Subcutaneous Q12H  . mupirocin ointment  1 application Nasal BID  . DISCONTD: sodium chloride   Intravenous STAT  . DISCONTD: ceFAZolin (ANCEF) IM  1 g Intramuscular Once  . DISCONTD: ceFAZolin (ANCEF) IV  1 g Intravenous Once  . DISCONTD: chlorhexidine  60 mL Topical Once  . DISCONTD: Chlorhexidine Gluconate Cloth  6 each Topical [id number]    Assessment: 70 yo F with hip fx. VTE  Prophylaxis Coumadin/pharmacy Heparin 5000 units SQ q12 until INR >/= 2 Goal of Therapy:  INR 2-3   Plan:  Coumadin 5mg  tonight, Daily protimes Heparin SQ till INR therapeutic Coumadin video/booklet for education  Chilton Si, Carla Rashad L 05/15/2011,9:27 PM

## 2011-05-15 NOTE — Op Note (Signed)
NAMESHANTY, GINTY                  ACCOUNT NO.:  [id number]  MEDICAL RECORD NO.:  [id number]  LOCATION:  [facility]                         FACILITY:  [facility]  PHYSICIAN:  Georges Lynch. Melannie Metzner, M.D.DATE OF BIRTH:  12/18/1940  DATE OF PROCEDURE:  05/15/2011 DATE OF DISCHARGE:                              OPERATIVE REPORT   SURGEON:  Georges Lynch. Darrelyn Hillock, MD  ASSISTANT:  Jaquelyn Bitter. Chabon, PA  PREOPERATIVE DIAGNOSIS:  Valgus impacted femoral neck fracture of the right hip closed.  POSTOPERATIVE DIAGNOSIS:  Valgus impacted femoral neck fracture of the right hip closed.  OPERATION:  Open reduction and internal fixation utilizing 3 cannulated Ace cannular screws, measuring 6.5 mm in diameter.  PROCEDURE IN DETAIL:  Under general anesthesia, routine orthopedic prep of the right hip was carried out and following that a sterile prep. After sterile drapes were applied, the appropriate time-out was carried out before any incisions were made.  Also I marked the appropriate right leg in the holding area.  At this time, the C-arm was brought in.  We then made a small incision laterally and opened the incision down to the femoral shaft.  At this time, the initial drill hole was made in the lateral femoral cortex, guide pin was inserted with the pin guide and the first pin was inserted followed by 2 additional pins.  During the procedure, Jodene Nam, PA, helped me with control of the bleeding and also with the retraction and also placement of the guide for the pins.  After we had the 3 pins inserted and we were satisfied with position under C-arm both AP and laterals, I then made the initial cortical drill holes.  Prior to it, measured the pin to be 75, 80, and 90 and the 3 pins then were inserted in usual fashion up into the femoral head.  We had excellent position noted on the AP and lateral. Following that, we thoroughly irrigated out the wound and closed the wound in layers in usual fashion.   Skin was closed with metal staples. The patient had 1 g of IV Ancef preop.          ______________________________ Georges Lynch. Darrelyn Hillock, M.D.     RAG/MEDQ  D:  05/15/2011  T:  05/15/2011  Job:  [id number]  cc:   Magnus Sinning) Tenny Craw, M.D. Fax: [phone]

## 2011-05-15 NOTE — Anesthesia Preprocedure Evaluation (Addendum)
Anesthesia Evaluation  Patient identified by MRN, date of birth, ID band Patient awake    Reviewed: Allergy & Precautions, H&P , NPO status , Patient's Chart, lab work & pertinent test results  Airway Mallampati: II TM Distance: >3 FB Neck ROM: full    Dental No notable dental hx. (+) Teeth Intact and Caps,    Pulmonary neg pulmonary ROS,  clear to auscultation  Pulmonary exam normal       Cardiovascular Exercise Tolerance: Good neg cardio ROS regular Normal    Neuro/Psych Negative Neurological ROS  Negative Psych ROS   GI/Hepatic negative GI ROS, Neg liver ROS, Controlled,  Endo/Other  Negative Endocrine ROS  Renal/GU negative Renal ROS  Genitourinary negative   Musculoskeletal negative musculoskeletal ROS (+)   Abdominal   Peds negative pediatric ROS (+)  Hematology negative hematology ROS (+)   Anesthesia Other Findings   Reproductive/Obstetrics negative OB ROS                        Anesthesia Physical Anesthesia Plan  ASA: I  Anesthesia Plan: General   Post-op Pain Management:    Induction: Intravenous  Airway Management Planned: Oral ETT  Additional Equipment:   Intra-op Plan:   Post-operative Plan: Extubation in OR  Informed Consent: I have reviewed the patients History and Physical, chart, labs and discussed the procedure including the risks, benefits and alternatives for the proposed anesthesia with the patient or authorized representative who has indicated his/her understanding and acceptance.   Dental Advisory Given  Plan Discussed with: CRNA and Surgeon  Anesthesia Plan Comments:        Anesthesia Quick Evaluation

## 2011-05-15 NOTE — Interval H&P Note (Signed)
History and Physical Interval Note:  05/15/2011 5:57 PM  Marie Coleman  has presented today for surgery, with the diagnosis of right hip fracture   The various methods of treatment have been discussed with the patient and family. After consideration of risks, benefits and other options for treatment, the patient has consented to  Procedure(s): CANNULATED HIP PINNING as a surgical intervention .  The patients' history has been reviewed, patient examined, no change in status, stable for surgery.  I have reviewed the patients' chart and labs.  Questions were answered to the patient's satisfaction.     Lennell Shanks A

## 2011-05-15 NOTE — Brief Op Note (Signed)
05/14/2011 - 05/15/2011  7:16 PM  PATIENT:  Marie Coleman  70 y.o. female  PRE-OPERATIVE DIAGNOSIS:  fractured right hip  POST-OPERATIVE DIAGNOSIS:  fractured right hip    PROCEDURE:  Procedure(s): CANNULATED HIP PINNING  SURGEON:  Surgeon(s): Iliyana Convey A Farin Buhman  PHYSICIAN ASSISTANT:   ASSISTANTS: Cristal Ford   ANESTHESIA:   general  EBL:     BLOOD ADMINISTERED:none  DRAINS: none   LOCAL MEDICATIONS USED:  NONE  SPECIMEN:  No Specimen  DISPOSITION OF SPECIMEN:  N/A  COUNTS:  YES  TOURNIQUET:  * No tourniquets in log *  DICTATION: .Other Dictation: Dictation Number [id number]  PLAN OF CARE: Admit to inpatient   PATIENT DISPOSITION:  PACU - hemodynamically stable.   2

## 2011-05-16 LAB — CBC
Hemoglobin: 10.9 g/dL — ABNORMAL LOW (ref 12.0–15.0)
Platelets: 189 10*3/uL (ref 150–400)
RBC: 3.76 MIL/uL — ABNORMAL LOW (ref 3.87–5.11)

## 2011-05-16 LAB — BASIC METABOLIC PANEL
CO2: 31 mEq/L (ref 19–32)
GFR calc non Af Amer: >90 mL/min (ref >90)
Glucose, Bld: 100 mg/dL — ABNORMAL HIGH (ref 70–99)
Potassium: 3.8 mEq/L (ref 3.5–5.1)
Sodium: 140 mEq/L (ref 135–145)

## 2011-05-16 LAB — PROTIME-INR
INR: 1.08 (ref 0.00–1.49)
Prothrombin Time: 14.2 seconds (ref 11.6–15.2)

## 2011-05-16 MED ORDER — WARFARIN SODIUM 5 MG PO TABS
5.0000 mg | ORAL_TABLET | Freq: Once | ORAL | Status: AC
  Administered 2011-05-16: 5 mg via ORAL
  Filled 2011-05-16: qty 1

## 2011-05-16 NOTE — Progress Notes (Signed)
Met with patient who is requesting SNF placement at [facility]. Faxed FL2 to [facility] and await confirmation- anticipate bed offer and transfer on Saturday.  FL2 placed in shadow chart in [facility] for your review and signature.  Reece Levy, MSW, Theresia Majors [phone]

## 2011-05-16 NOTE — Progress Notes (Signed)
ANTICOAGULATION CONSULT NOTE - Follow Up Consult  Pharmacy Consult for Coumadin Indication: VTE prophylaxis s/p hip fracture repair  Allergies  Allergen Reactions  . Sulfa Antibiotics Hives    Patient Measurements: Height: 5\' 6"  (167.6 cm) Weight: 120 lb (54.432 kg) IBW/kg (Calculated) : 59.3   Vital Signs: Temp: 98 F (36.7 C) (11/29 0530) Temp src: Oral (11/29 0530) BP: 99/62 mmHg (11/29 0530) Pulse Rate: 74  (11/29 0530)  Labs:  Basename 05/16/11 0405 05/14/11 1950 05/14/11 1215  HGB 10.9* 12.1 --  HCT 34.4* 36.5 40.0  PLT 189 192 224  APTT -- -- 28  LABPROT 14.2 -- 13.3  INR 1.08 -- 0.99  HEPARINUNFRC -- -- --  CREATININE 0.58 0.55 0.55  CKTOTAL -- -- --  CKMB -- -- --  TROPONINI -- -- --   Estimated Creatinine Clearance: 56.2 ml/min (by C-G formula based on Cr of 0.58).   Medications:  Scheduled:    . ceFAZolin (ANCEF) IV  1 g Intravenous Q6H  . coumadin book   Does not apply Once  . heparin  5,000 Units Subcutaneous Q12H  . mupirocin ointment  1 application Nasal BID  . warfarin  5 mg Oral Once  . warfarin   Does not apply Once  . DISCONTD: sodium chloride   Intravenous STAT  . DISCONTD: ceFAZolin (ANCEF) IM  1 g Intramuscular Once  . DISCONTD: ceFAZolin (ANCEF) IV  1 g Intravenous Once  . DISCONTD: chlorhexidine  60 mL Topical Once  . DISCONTD: Chlorhexidine Gluconate Cloth  6 each Topical Q0600  . DISCONTD: heparin  5,000 Units Subcutaneous Q12H   Infusions:    . lactated ringers 100 mL/hr at 05/16/11 0942  . DISCONTD: lactated ringers     PRN: alum & mag hydroxide-simeth, bisacodyl, bisacodyl, HYDROcodone-acetaminophen, HYDROmorphone (DILAUDID) injection, menthol-cetylpyridinium, methocarbamol(ROBAXIN) IV, methocarbamol, ondansetron (ZOFRAN) IV, ondansetron, phenol, DISCONTD: bacitracin, DISCONTD: HYDROmorphone, DISCONTD: methocarbamol, DISCONTD:  morphine injection, DISCONTD: promethazine, DISCONTD: sodium chloride irrigation, DISCONTD: sodium  chloride irrigation  Anticoagulants: Heparin 5000 units SQ TID 11/28 -  Coumadin 5mg  on 11/28  Assessment: 70 yo F s/p hip fracture repair INR is subtherapeutic as expected after only one dose Heparin SQ ordered until INR is therapeutic  Goal of Therapy:  INR 2-3   Plan:  Coumadin 5mg  PO today Daily PT/INR Continue Heparin  Lynann Beaver PharmD  Pager [phone] 05/16/2011 10:47 AM

## 2011-05-16 NOTE — Progress Notes (Signed)
0 she is npo

## 2011-05-16 NOTE — Progress Notes (Signed)
Physical Therapy Evaluation Patient Details Name: Marie Coleman MRN: [id number] DOB: 1941/05/25 Today's Date: 05/16/2011 11:37-12:04, EV2  Problem List:  Patient Active Problem List  Diagnoses  . Hip fracture, right    Past Medical History: History reviewed. No pertinent past medical history. Past Surgical History: History reviewed. No pertinent past surgical history.  PT Assessment/Plan/Recommendation PT Assessment Clinical Impression Statement: 70 y/o WF s/p fall and R ORIF.  Pt reports she has L knee arthritis and needs a L TKA.  Pt did well with PT and should progress well in acute care for anticipated d/c to SNF for further rehab. PT Recommendation/Assessment: Patient will need skilled PT in the acute care venue PT Problem List: Decreased strength;Decreased range of motion;Decreased activity tolerance;Decreased mobility;Decreased knowledge of use of DME;Decreased knowledge of precautions PT Therapy Diagnosis : Difficulty walking PT Plan PT Frequency: Min 5X/week PT Treatment/Interventions: DME instruction;Gait training;Functional mobility training;Therapeutic exercise PT Recommendation Follow Up Recommendations: Skilled nursing facility Equipment Recommended: Defer to next venue PT Goals  Acute Rehab PT Goals PT Goal Formulation: With patient Pt will go Supine/Side to Sit: with min assist;with rail PT Goal: Supine/Side to Sit - Progress: Progressing toward goal Pt will Transfer Sit to Stand/Stand to Sit: with min assist;from elevated surface PT Transfer Goal: Sit to Stand/Stand to Sit - Progress: Progressing toward goal Pt will Ambulate: 16 - 50 feet;with supervision;with rolling walker PT Goal: Ambulate - Progress: Progressing toward goal Pt will Perform Home Exercise Program: with supervision, verbal cues required/provided PT Goal: Perform Home Exercise Program - Progress: Progressing toward goal  PT Evaluation Precautions/Restrictions  Restrictions Weight Bearing  Restrictions: Yes RLE Weight Bearing: Partial weight bearing Prior Functioning  Home Living Lives With: Spouse Receives Help From: Family Type of Home: House Home Layout: Two level;Able to live on main level with bedroom/bathroom Home Access: Stairs to enter Home Adaptive Equipment: None Prior Function Level of Independence: Independent with homemaking with ambulation Cognition Cognition Arousal/Alertness: Awake/alert Overall Cognitive Status: Appears within functional limits for tasks assessed Orientation Level: Oriented X4 Sensation/Coordination   Extremity Assessment RLE AROM (degrees) Overall AROM Right Lower Extremity: Deficits;Due to pain;Other (Comment) (LImited hip abduction, hip flexion to 50degrees) LLE Assessment LLE Assessment: Within Functional Limits (pt reports she needs L TKA) Mobility (including Balance) Bed Mobility Bed Mobility: Yes Supine to Sit: 3: Mod assist Supine to Sit Details (indicate cue type and reason): A for R LE and for trunk Transfers Transfers: Yes Sit to Stand: 3: Mod assist Sit to Stand Details (indicate cue type and reason): cues for hand placement Stand to Sit: 4: Min assist Stand to Sit Details: tactile cues for hand placement Ambulation/Gait Ambulation/Gait: Yes Ambulation/Gait Assistance: 4: Min assist Ambulation/Gait Assistance Details (indicate cue type and reason): verbal instruction for proper sequencing. Ambulation Distance (Feet): 5 Feet Assistive device: Rolling walker Gait Pattern: Step-to pattern    Exercise  Total Joint Exercises Ankle Circles/Pumps: AROM;Both Quad Sets: AROM;Strengthening;Right;10 reps Heel Slides: AAROM;Right;10 reps Hip ABduction/ADduction: AAROM;Right;10 reps End of Session PT - End of Session Equipment Utilized During Treatment: Gait belt Activity Tolerance: Patient tolerated treatment well Patient left: in chair;with call bell in reach;with family/visitor present Nurse Communication:  Mobility status for transfers General Behavior During Session: [facility] for tasks performed Cognition: [facility] for tasks performed  Marie Coleman 05/16/2011, 12:35 PM

## 2011-05-16 NOTE — Progress Notes (Signed)
Subjective: Doing Fine.No pain. Stable and dressing dry   Objective: Vital signs in last 24 hours: Temp:  [97.4 F (36.3 C)-98.1 F (36.7 C)] 98 F (36.7 C) (11/29 0530) Pulse Rate:  [64-81] 74  (11/29 0530) Resp:  [15-20] 18  (11/29 0530) BP: (94-115)/(48-71) 99/62 mmHg (11/29 0530) SpO2:  [99 %-100 %] 99 % (11/29 0530)  Intake/Output from previous day: 11/28 0701 - 11/29 0700 In: 2513 [I.V.:2353] Out: 2510 [Urine:2360; Blood:150] Intake/Output this shift:     Basename 05/16/11 0405 05/14/11 1950 05/14/11 1215  HGB 10.9* 12.1 13.3    Basename 05/16/11 0405 05/14/11 1950  WBC 6.4 7.3  RBC 3.76* 4.03  HCT 34.4* 36.5  PLT 189 192    Basename 05/16/11 0405 05/14/11 1950  NA 140 139  K 3.8 4.1  CL 103 106  CO2 31 26  BUN 8 11  CREATININE 0.58 0.55  GLUCOSE 100* 124*  CALCIUM 8.8 8.3*    Basename 05/16/11 0405 05/14/11 1215  LABPT -- --  INR 1.08 0.99    Neurologically intact Intact pulses distally Dorsiflexion/Plantar flexion intact  Assessment/Plan: Family will decide on DC Plans.   Ezekial Arns A 05/16/2011, 7:40 AM

## 2011-05-17 LAB — BASIC METABOLIC PANEL
BUN: 11 mg/dL (ref 6–23)
Calcium: 8.6 mg/dL (ref 8.4–10.5)
Creatinine, Ser: 0.61 mg/dL (ref 0.50–1.10)
GFR calc Af Amer: >90 mL/min (ref >90)
GFR calc non Af Amer: 90 mL/min — ABNORMAL LOW (ref >90)

## 2011-05-17 LAB — CBC
HCT: 33.5 % — ABNORMAL LOW (ref 36.0–46.0)
MCHC: 33.1 g/dL (ref 30.0–36.0)
MCV: 91 fL (ref 78.0–100.0)
Platelets: 190 10*3/uL (ref 150–400)
RDW: 13 % (ref 11.5–15.5)

## 2011-05-17 MED ORDER — WARFARIN SODIUM 5 MG PO TABS: 5.0000 mg | ORAL_TABLET | Freq: Once | ORAL | Status: DC

## 2011-05-17 MED ORDER — WARFARIN SODIUM 5 MG PO TABS
5.0000 mg | ORAL_TABLET | Freq: Once | ORAL | Status: AC
  Administered 2011-05-17: 5 mg via ORAL
  Filled 2011-05-17: qty 1

## 2011-05-17 MED ORDER — ONDANSETRON HCL 4 MG PO TABS: 4.0000 mg | ORAL_TABLET | Freq: Four times a day (QID) | ORAL | Status: AC | PRN

## 2011-05-17 MED ORDER — METHOCARBAMOL 500 MG PO TABS: 500.0000 mg | ORAL_TABLET | Freq: Four times a day (QID) | ORAL | Status: AC | PRN

## 2011-05-17 MED ORDER — HYDROCODONE-ACETAMINOPHEN 5-325 MG PO TABS: 1.0000 | ORAL_TABLET | ORAL | Status: AC | PRN

## 2011-05-17 NOTE — Discharge Summary (Signed)
Physician Discharge Summary   Patient ID: Marie Coleman MRN: [id number] DOB/AGE: Feb 27, 1941 70 y.o.  Admit date: 05/14/2011 Discharge date: 05/18/2011  Primary Diagnosis: right femoral neck fracture   Admission Diagnoses: History reviewed. No pertinent past medical history.  Discharge Diagnoses:  Active Problems:  Hip fracture, right   Procedure: Procedure(s) (LRB): CANNULATED HIP PINNING (Right)   Consults: none  HPI: Patient states she just slipped and fell early on the day of admission . She states she landed on her right hip and sustained no other injury. Denies LOC, SOB, chest pain or dizziness.   Laboratory Data: Results for orders placed during the hospital encounter of 05/14/11  BASIC METABOLIC PANEL      Component Value Range   Sodium 137  135 - 145 (mEq/L)   Potassium 4.0  3.5 - 5.1 (mEq/L)   Chloride 102  96 - 112 (mEq/L)   CO2 25  19 - 32 (mEq/L)   Glucose, Bld 97  70 - 99 (mg/dL)   BUN 17  6 - 23 (mg/dL)   Creatinine, Ser 7.25  0.50 - 1.10 (mg/dL)   Calcium 9.1  8.4 - 36.6 (mg/dL)   GFR calc non Af Amer >90  >90 (mL/min)   GFR calc Af Amer >90  >90 (mL/min)  CBC      Component Value Range   WBC 11.0 (*) 4.0 - 10.5 (K/uL)   RBC 4.42  3.87 - 5.11 (MIL/uL)   Hemoglobin 13.3  12.0 - 15.0 (g/dL)   HCT 44.0  34.7 - 42.5 (%)   MCV 90.5  78.0 - 100.0 (fL)   MCH 30.1  26.0 - 34.0 (pg)   MCHC 33.3  30.0 - 36.0 (g/dL)   RDW 95.6  38.7 - 56.4 (%)   Platelets 224  150 - 400 (K/uL)  PROTIME-INR      Component Value Range   Prothrombin Time 13.3  11.6 - 15.2 (seconds)   INR 0.99  0.00 - 1.49   TYPE AND SCREEN      Component Value Range   ABO/RH(D) O POS     Antibody Screen NEG     Sample Expiration 05/17/2011    APTT      Component Value Range   aPTT 28  24 - 37 (seconds)  ABO/RH      Component Value Range   ABO/RH(D) O POS    CBC      Component Value Range   WBC 7.3  4.0 - 10.5 (K/uL)   RBC 4.03  3.87 - 5.11 (MIL/uL)   Hemoglobin 12.1  12.0 - 15.0  (g/dL)   HCT 33.2  95.1 - 88.4 (%)   MCV 90.6  78.0 - 100.0 (fL)   MCH 30.0  26.0 - 34.0 (pg)   MCHC 33.2  30.0 - 36.0 (g/dL)   RDW 16.6  06.3 - 01.6 (%)   Platelets 192  150 - 400 (K/uL)  DIFFERENTIAL      Component Value Range   Neutrophils Relative 75  43 - 77 (%)   Neutro Abs 5.5  1.7 - 7.7 (K/uL)   Lymphocytes Relative 17  12 - 46 (%)   Lymphs Abs 1.2  0.7 - 4.0 (K/uL)   Monocytes Relative 7  3 - 12 (%)   Monocytes Absolute 0.5  0.1 - 1.0 (K/uL)   Eosinophils Relative 1  0 - 5 (%)   Eosinophils Absolute 0.1  0.0 - 0.7 (K/uL)   Basophils Relative 0  0 -  1 (%)   Basophils Absolute 0.0  0.0 - 0.1 (K/uL)  COMPREHENSIVE METABOLIC PANEL      Component Value Range   Sodium 139  135 - 145 (mEq/L)   Potassium 4.1  3.5 - 5.1 (mEq/L)   Chloride 106  96 - 112 (mEq/L)   CO2 26  19 - 32 (mEq/L)   Glucose, Bld 124 (*) 70 - 99 (mg/dL)   BUN 11  6 - 23 (mg/dL)   Creatinine, Ser 1.61  0.50 - 1.10 (mg/dL)   Calcium 8.3 (*) 8.4 - 10.5 (mg/dL)   Total Protein 6.4  6.0 - 8.3 (g/dL)   Albumin 3.1 (*) 3.5 - 5.2 (g/dL)   AST 17  0 - 37 (U/L)   ALT 9  0 - 35 (U/L)   Alkaline Phosphatase 47  39 - 117 (U/L)   Total Bilirubin 0.4  0.3 - 1.2 (mg/dL)   GFR calc non Af Amer >90  >90 (mL/min)   GFR calc Af Amer >90  >90 (mL/min)  URINALYSIS, ROUTINE W REFLEX MICROSCOPIC      Component Value Range   Color, Urine YELLOW  YELLOW    APPearance CLEAR  CLEAR    Specific Gravity, Urine 1.007  1.005 - 1.030    pH 7.0  5.0 - 8.0    Glucose, UA NEGATIVE  NEGATIVE (mg/dL)   Hgb urine dipstick SMALL (*) NEGATIVE    Bilirubin Urine NEGATIVE  NEGATIVE    Ketones, ur NEGATIVE  NEGATIVE (mg/dL)   Protein, ur NEGATIVE  NEGATIVE (mg/dL)   Urobilinogen, UA 0.2  0.0 - 1.0 (mg/dL)   Nitrite NEGATIVE  NEGATIVE    Leukocytes, UA NEGATIVE  NEGATIVE   URINE MICROSCOPIC-ADD ON      Component Value Range   Squamous Epithelial / LPF RARE  RARE    RBC / HPF 0-2  <3 (RBC/hpf)  SURGICAL PCR SCREEN      Component Value  Range   MRSA, PCR POSITIVE (*) NEGATIVE    Staphylococcus aureus POSITIVE (*) NEGATIVE   CBC      Component Value Range   WBC 6.4  4.0 - 10.5 (K/uL)   RBC 3.76 (*) 3.87 - 5.11 (MIL/uL)   Hemoglobin 10.9 (*) 12.0 - 15.0 (g/dL)   HCT 09.6 (*) 04.5 - 46.0 (%)   MCV 91.5  78.0 - 100.0 (fL)   MCH 29.0  26.0 - 34.0 (pg)   MCHC 31.7  30.0 - 36.0 (g/dL)   RDW 40.9  81.1 - 91.4 (%)   Platelets 189  150 - 400 (K/uL)  BASIC METABOLIC PANEL      Component Value Range   Sodium 140  135 - 145 (mEq/L)   Potassium 3.8  3.5 - 5.1 (mEq/L)   Chloride 103  96 - 112 (mEq/L)   CO2 31  19 - 32 (mEq/L)   Glucose, Bld 100 (*) 70 - 99 (mg/dL)   BUN 8  6 - 23 (mg/dL)   Creatinine, Ser 7.82  0.50 - 1.10 (mg/dL)   Calcium 8.8  8.4 - 95.6 (mg/dL)   GFR calc non Af Amer >90  >90 (mL/min)   GFR calc Af Amer >90  >90 (mL/min)  PROTIME-INR      Component Value Range   Prothrombin Time 14.2  11.6 - 15.2 (seconds)   INR 1.08  0.00 - 1.49   CBC      Component Value Range   WBC 6.6  4.0 - 10.5 (K/uL)   RBC  3.68 (*) 3.87 - 5.11 (MIL/uL)   Hemoglobin 11.1 (*) 12.0 - 15.0 (g/dL)   HCT 16.1 (*) 09.6 - 46.0 (%)   MCV 91.0  78.0 - 100.0 (fL)   MCH 30.2  26.0 - 34.0 (pg)   MCHC 33.1  30.0 - 36.0 (g/dL)   RDW 04.5  40.9 - 81.1 (%)   Platelets 190  150 - 400 (K/uL)  BASIC METABOLIC PANEL      Component Value Range   Sodium 135  135 - 145 (mEq/L)   Potassium 3.9  3.5 - 5.1 (mEq/L)   Chloride 100  96 - 112 (mEq/L)   CO2 31  19 - 32 (mEq/L)   Glucose, Bld 106 (*) 70 - 99 (mg/dL)   BUN 11  6 - 23 (mg/dL)   Creatinine, Ser 9.14  0.50 - 1.10 (mg/dL)   Calcium 8.6  8.4 - 78.2 (mg/dL)   GFR calc non Af Amer 90 (*) >90 (mL/min)   GFR calc Af Amer >90  >90 (mL/min)  PROTIME-INR      Component Value Range   Prothrombin Time 15.0  11.6 - 15.2 (seconds)   INR 1.16  0.00 - 1.49    Hospital Outpatient Visit on 07/31/2007  Component Date Value Range Status  . Hemoglobin  07/31/2007 12.8 POINT OF CARE RESULT   Final     X-Rays:Dg Chest 2 View  05/14/2011  *RADIOLOGY REPORT*  Clinical Data: Right hip fracture, preop.  CHEST - 2 VIEW  Comparison: None  Findings: Heart and mediastinal contours are within normal limits. No focal opacities or effusions.  No acute bony abnormality.  IMPRESSION: No active cardiopulmonary disease.  Original Report Authenticated By: Cyndie Chime, M.D.   Dg Hip Complete Right  05/14/2011  *RADIOLOGY REPORT*  Clinical Data: Fall, right hip pain.  RIGHT HIP - COMPLETE 2+ VIEW  Comparison: None.  Findings: There is a right femoral neck fracture with slight valgus angulation.  No dislocation.  Left hip is unremarkable.  Remainder bony pelvis unremarkable.  Degenerative changes in the lower lumbar spine.  IMPRESSION: Right femoral neck fracture with slight valgus angulation.  Original Report Authenticated By: Cyndie Chime, M.D.   Dg Hip Operative Right  05/16/2011  *RADIOLOGY REPORT*  Clinical Data: Internal fixation of right hip fracture.  OPERATIVE RIGHT HIP  Comparison: Right hip radiographs performed 05/14/2011  Findings: Two fluoroscopic C-arm images are provided from the OR. These demonstrate successful placement of three pins across the patient's right femoral neck fracture.  There is minimal persistent displacement at the fracture site.  The right femoral head remains seated at the acetabulum.  No new fractures are seen.  IMPRESSION: Successful placement of three pins across the patient's right femoral neck fracture; minimal persistent displacement at the fracture site.  No new fractures seen.  Original Report Authenticated By: Tonia Ghent, M.D.    EKG:No orders found for this or any previous visit.   Hospital Course: 70 year old healthy female status post fall resulting in right femoral neck fracture. Patient to be admited to Dr. Lestine Box service for right femoral neck fracture. Dr. Darrelyn Hillock performed cannulated pinning of right hip fracture on 05/15/11 at [facility].  Patient tolerated well and [facility].  Did fine on day on and started with therapy. Walked only five feet on day one. She wanted to look into SNF so we got social work involved.  Dressing changed on day two and was healing well. She  was progressing slowly and felt to be a good candidate for SNF.  Bed was found at [facility].  She was doing well on day two and arrangements were being made for Saturday.  Plan is to go to [place] tomorrow once she is rechecked by the weekend covering service.  If she is doing well and meeting goals then she will be transferred tomorrow Saturday 05/18/2011 to [place].    Discharge Medications: Prior to Admission medications   Medication Sig Start Date End Date Taking? Authorizing Provider  cholecalciferol (VITAMIN D) 1000 UNITS tablet Take 1,000 Units by mouth daily.     Yes Historical Provider, MD  estradiol (VIVELLE-DOT) 0.075 MG/24HR Place 1 patch onto the skin 2 (two) times a week.     Yes Historical Provider, MD  fexofenadine (ALLEGRA) 180 MG tablet Take 180 mg by mouth daily as needed. For allergies    Yes Historical Provider, MD  OVER THE COUNTER MEDICATION Take 1 Can by mouth daily. Joint Juice    Yes Historical Provider, MD  HYDROcodone-acetaminophen (NORCO) 5-325 MG per tablet Take 1-2 tablets by mouth every 4 (four) hours as needed. 05/17/11 05/27/11  Ronald A Gioffre  methocarbamol (ROBAXIN) 500 MG tablet Take 1 tablet (500 mg total) by mouth every 6 (six) hours as needed. 05/17/11 05/27/11  Windy Fast A Gioffre  ondansetron (ZOFRAN) 4 MG tablet Take 1 tablet (4 mg total) by mouth every 6 (six) hours as needed for nausea. 05/17/11 05/24/11  Windy Fast A Gioffre  warfarin (COUMADIN) 5 MG tablet Take 1 tablet (5 mg total) by mouth one time only at 6 PM. 05/17/11 05/16/12  Jacki Cones    Diet: heart healthy  Activity:PWB No bending hip over 90 degrees- A "L" Angle Do not cross legs Do not let foot roll inward  When turning these  patients a pillow should be placed between the patient's legs to prevent crossing.  Patients should have the affected knee fully extended when trying to sit or stand from all surfaces to prevent excessive hip flexion.  When ambulating and turning toward the affected side the affected leg should have the toes turned out prior to moving the walker and the rest of patient's body as to prevent internal rotation/ turning in of the leg.  Abduction pillows are the most effective way to prevent a patient from not crossing legs or turning toes in at rest. If an abduction pillow is not ordered placing a regular pillow length wise between the patient's legs is also an effective reminder.  It is imperative that these precautions be maintained so that the surgical hip does not dislocate.    Follow-up:in 2 weeks  Disposition: [facility]  Discharged Condition: stable at time of summary, transfer tomorrow if improved.    Current Discharge Medication List    START taking these medications   Details  HYDROcodone-acetaminophen (NORCO) 5-325 MG per tablet Take 1-2 tablets by mouth every 4 (four) hours as needed. Qty: 50 tablet, Refills: 1    methocarbamol (ROBAXIN) 500 MG tablet Take 1 tablet (500 mg total) by mouth every 6 (six) hours as needed. Qty: 50 tablet, Refills: 1    ondansetron (ZOFRAN) 4 MG tablet Take 1 tablet (4 mg total) by mouth every 6 (six) hours as needed for nausea. Qty: 20 tablet, Refills: 1    warfarin (COUMADIN) 5 MG tablet Take 1 tablet (5 mg total) by mouth one time only at 6 PM. Qty: 36 tablet, Refills: 0  CONTINUE these medications which have NOT CHANGED   Details  cholecalciferol (VITAMIN D) 1000 UNITS tablet Take 1,000 Units by mouth daily.      estradiol (VIVELLE-DOT) 0.075 MG/24HR Place 1 patch onto the skin 2 (two) times a week.      fexofenadine (ALLEGRA) 180 MG tablet Take 180 mg by mouth daily as needed. For allergies     OVER THE COUNTER MEDICATION  Take 1 Can by mouth daily. Joint Juice       STOP taking these medications     aspirin 81 MG tablet      ibuprofen (ADVIL,MOTRIN) 200 MG tablet        Follow-up Information    Follow up with GIOFFRE,RONALD A in 2 weeks.   Contact information:   [facility] [address], [address] [place] [address] [phone]          Signed: Patrica Duel 05/17/2011, 5:23 PM

## 2011-05-17 NOTE — Progress Notes (Signed)
Occupational Therapy Evaluation Patient Details Name: Marie Coleman MRN: [id number] DOB: 1940-07-03 Today's Date: 05/17/2011 0941 1000 ev2  Problem List:  Patient Active Problem List  Diagnoses  . Hip fracture, right    Past Medical History: History reviewed. No pertinent past medical history. Past Surgical History: History reviewed. No pertinent past surgical history.  OT Assessment/Plan/Recommendation OT Assessment Clinical Impression Statement: Pt is a 70 year old female s/p R ORIF with PWB precautions.  She fatiques easily and is very motivated to resume independence with ADLS.  She will benefit from skilled OT in acute (and later STSNF) to reach this goal.  Anticiipate pt will reach min A level with ADLS in acute, and supervision for toilet transfers.   OT Recommendation/Assessment: Patient will need skilled OT in the acute care venue OT Problem List: Decreased strength;Decreased activity tolerance;Decreased knowledge of use of DME or AE OT Recommendation Follow Up Recommendations: Skilled nursing facility Equipment Recommended: Defer to next venue Individuals Consulted Consulted and Agree with Results and Recommendations: Patient OT Goals Acute Rehab OT Goals OT Goal Formulation: With patient Time For Goal Achievement: 7 days ADL Goals Pt Will Perform Grooming: with supervision;Standing at sink ADL Goal: Grooming - Progress: Progressing toward goals Pt Will Perform Lower Body Bathing: with supervision;with adaptive equipment;Sit to stand from chair ADL Goal: Lower Body Bathing - Progress: Progressing toward goals Pt Will Perform Lower Body Dressing: with min assist;Sit to stand from chair;with adaptive equipment ADL Goal: Lower Body Dressing - Progress: Progressing toward goals Pt Will Transfer to Toilet: with supervision;Ambulation;Maintaining weight bearing status;3-in-1 ADL Goal: Toilet Transfer - Progress: Progressing toward goals Pt Will Perform Toileting - Clothing  Manipulation: with supervision;Standing ADL Goal: Toileting - Clothing Manipulation - Progress: Progressing toward goals Pt Will Perform Toileting - Hygiene: with supervision;Sit to stand from 3-in-1/toilet ADL Goal: Toileting - Hygiene - Progress: Progressing toward goals  OT Evaluation Precautions/Restrictions  Restrictions Weight Bearing Restrictions: Yes RLE Weight Bearing: Partial weight bearing Prior Functioning Home Living Bathroom Toilet:  (pt plans STSNF for rehab; did not ask home questions for OT)   ADL ADL Grooming: Performed;Teeth care;Minimal assistance;Other (comment) (min A for set up in standing; pt fatiques quickly) Where Assessed - Grooming: Standing at sink Upper Body Bathing: Simulated;Set up Where Assessed - Upper Body Bathing: Sitting, chair;Supported Lower Body Bathing: Simulated;Moderate assistance Where Assessed - Lower Body Bathing: Sit to stand from chair Upper Body Dressing: Performed;Minimal assistance;Other (comment) (iv) Where Assessed - Upper Body Dressing: Standing Lower Body Dressing: Simulated;Moderate assistance Where Assessed - Lower Body Dressing: Sit to stand from chair Toilet Transfer: Minimal assistance;Performed (min guard) Toilet Transfer Method: Ambulating (min guard) Toilet Transfer Equipment: Raised toilet seat with arms (or 3-in-1 over toilet) Toileting - Clothing Manipulation: Simulated;Minimal assistance;Other (comment) (min guard) Where Assessed - Toileting Clothing Manipulation: Standing Toileting - Hygiene: Performed;Set up Where Assessed - Toileting Hygiene: Sit on 3-in-1 or toilet Equipment Used: Rolling walker ADL Comments: Educated on the use of AE for ADLs.  Did not use today.  Cotx wtih PT Vision/Perception  Vision - History Patient Visual Report: No change from baseline Cognition Cognition Overall Cognitive Status: Appears within functional limits for tasks assessed Orientation Level: Oriented  X4 Sensation/Coordination   Extremity Assessment RUE Assessment RUE Assessment: Within Functional Limits LUE Assessment LUE Assessment: Within Functional Limits (h/o RCR; no limitations) Mobility  Transfers Sit to Stand: 4: Min assist;Other (comment) (min guard) Sit to Stand Details (indicate cue type and reason): good recall of hand placement Stand to  Sit: 4: Min assist (min/guard)  End of Session OT - End of Session Activity Tolerance: Patient limited by fatigue Patient left: in chair;Other (comment) (PT remained with pt for exercises) General Behavior During Session: [facility] for tasks performed Cognition: [facility] for tasks performed   Elaysha Bevard [phone] 05/17/2011, 10:53 AM

## 2011-05-17 NOTE — Progress Notes (Signed)
Physical Therapy Treatment Patient Details Name: Marie Coleman MRN: [id number] DOB: 1940/07/30 Today's Date: 05/17/2011 9:38-10:08 PT Assessment/Plan  PT - Assessment/Plan Comments on Treatment Session: Pt ambulated to bathroom and back.  Pt is doing well and plans on going to SNF tomorrow. PT Plan: Discharge plan remains appropriate;Frequency remains appropriate PT Frequency: Min 5X/week Follow Up Recommendations: Skilled nursing facility Equipment Recommended: Defer to next venue PT Goals  Acute Rehab PT Goals PT Transfer Goal: Sit to Stand/Stand to Sit - Progress: Progressing toward goal PT Goal: Ambulate - Progress: Progressing toward goal PT Goal: Perform Home Exercise Program - Progress: Progressing toward goal  PT Treatment Precautions/Restrictions  Restrictions Weight Bearing Restrictions: Yes RLE Weight Bearing: Partial weight bearing Mobility (including Balance) Transfers Sit to Stand: 4: Min assist (min/guard) Sit to Stand Details (indicate cue type and reason): good recall of hand placement Stand to Sit: 4: Min assist (min/guard) Ambulation/Gait Ambulation/Gait Assistance: 4: Min assist (min/guard) Ambulation/Gait Assistance Details (indicate cue type and reason): good sequencing and PWB status Ambulation Distance (Feet): 15 Feet (x 2 reps) Assistive device: Rolling walker Gait Pattern: Step-to pattern    Exercise  Total Joint Exercises Ankle Circles/Pumps: AROM;Both Towel Squeeze: Strengthening;Both;10 reps;Seated Hip ABduction/ADduction: AAROM;Right;10 reps Long Arc Quad: AROM;Strengthening;Both;10 reps Other Exercises Other Exercises: seated hip flexion x 10 reps End of Session PT - End of Session Activity Tolerance: Patient tolerated treatment well Patient left: in chair;with call bell in reach Nurse Communication: Mobility status for transfers General Behavior During Session: [facility] for tasks performed Cognition: [facility] for tasks performed  [facility]  LUBECK 05/17/2011, 10:16 AM

## 2011-05-17 NOTE — Progress Notes (Signed)
Patient set for SNF transfer tomorrow to [facility]. Spoke with Avel Peace, PA-C, in regards to needing D/C summary today for SNF- weekend CSW to facilitate d/c in a.m.  Reece Levy, MSW, Theresia Majors [phone]

## 2011-05-17 NOTE — Progress Notes (Signed)
ANTICOAGULATION CONSULT NOTE - Follow Up Consult  Pharmacy Consult for Coumadin Indication: VTE prophylaxis s/p hip fracture repair  Allergies  Allergen Reactions  . Sulfa Antibiotics Hives    Patient Measurements: Height: 5\' 6"  (167.6 cm) Weight: 120 lb (54.432 kg) IBW/kg (Calculated) : 59.3   Vital Signs: Temp: 98.8 F (37.1 C) (11/30 0605) Temp src: Oral (11/30 0605) BP: 98/60 mmHg (11/30 0605) Pulse Rate: 77  (11/30 0605)  Labs:  Basename 05/17/11 0445 05/16/11 0405 05/14/11 1950 05/14/11 1215  HGB 11.1* 10.9* -- --  HCT 33.5* 34.4* 36.5 --  PLT 190 189 192 --  APTT -- -- -- 28  LABPROT 15.0 14.2 -- 13.3  INR 1.16 1.08 -- 0.99  HEPARINUNFRC -- -- -- --  CREATININE 0.61 0.58 0.55 --  CKTOTAL -- -- -- --  CKMB -- -- -- --  TROPONINI -- -- -- --   Estimated Creatinine Clearance: 56.2 ml/min (by C-G formula based on Cr of 0.61).  Medications:  Scheduled:     . ceFAZolin (ANCEF) IV  1 g Intravenous Q6H  . heparin  5,000 Units Subcutaneous Q12H  . mupirocin ointment  1 application Nasal BID  . warfarin  5 mg Oral ONCE-1800  . warfarin   Does not apply Once   Infusions:     . lactated ringers 20 mL/hr at 05/16/11 2230   PRN: alum & mag hydroxide-simeth, bisacodyl, bisacodyl, HYDROcodone-acetaminophen, HYDROmorphone (DILAUDID) injection, menthol-cetylpyridinium, methocarbamol(ROBAXIN) IV, methocarbamol, ondansetron (ZOFRAN) IV, ondansetron, phenol  Anticoagulants: Heparin 5000 units SQ TID 11/28 -  Coumadin 5mg  on 11/28, 1129  Assessment: 70 yo F s/p hip fracture repair INR is subtherapeutic, but increasing slowly Heparin SQ ordered until INR is therapeutic  Goal of Therapy:  INR 2-3   Plan:  Coumadin 5mg  PO today Daily PT/INR Continue Heparin Coumadin education  Lynann Beaver PharmD  Pager [phone] 05/17/2011 9:17 AM

## 2011-05-17 NOTE — Discharge Summary (Signed)
Physician Discharge Summary  Patient ID: Marie Coleman MRN: [id number] DOB/AGE: [birth year] 70 y.o.  Admit date: 05/14/2011 Discharge date: 05/17/2011  Admission Diagnoses:  Discharge Diagnoses:  Active Problems:  Hip fracture, right   Discharged Condition: good  Hospital Course: Did very well.  Consults: none  Significant Diagnostic Studies: labs: INR and HBG  Treatments: antibiotics: Ancef  Discharge Exam: Blood pressure 101/64, pulse 81, temperature 98.5 F (36.9 C), temperature source Oral, resp. rate 16, height 5\' 6"  (1.676 m), weight 54.432 kg (120 lb), SpO2 99.00%. General appearance: alert Resp: clear to auscultation bilaterally Extremities: extremities normal, atraumatic, no cyanosis or edema Pulses: 2+ and symmetric Neurologic: Grossly normal Incision/Wound:  Disposition:To SNF Final discharge disposition not confirmed   Current Discharge Medication List    START taking these medications   Details  HYDROcodone-acetaminophen (NORCO) 5-325 MG per tablet Take 1-2 tablets by mouth every 4 (four) hours as needed. Qty: 50 tablet, Refills: 1    methocarbamol (ROBAXIN) 500 MG tablet Take 1 tablet (500 mg total) by mouth every 6 (six) hours as needed. Qty: 50 tablet, Refills: 1    ondansetron (ZOFRAN) 4 MG tablet Take 1 tablet (4 mg total) by mouth every 6 (six) hours as needed for nausea. Qty: 20 tablet, Refills: 1    warfarin (COUMADIN) 5 MG tablet Take 1 tablet (5 mg total) by mouth one time only at 6 PM. Qty: 36 tablet, Refills: 0      CONTINUE these medications which have NOT CHANGED   Details  cholecalciferol (VITAMIN D) 1000 UNITS tablet Take 1,000 Units by mouth daily.      estradiol (VIVELLE-DOT) 0.075 MG/24HR Place 1 patch onto the skin 2 (two) times a week.      fexofenadine (ALLEGRA) 180 MG tablet Take 180 mg by mouth daily as needed. For allergies     OVER THE COUNTER MEDICATION Take 1 Can by mouth daily. Joint Juice       STOP taking these  medications     aspirin 81 MG tablet      ibuprofen (ADVIL,MOTRIN) 200 MG tablet        Follow-up Information    Follow up with Lason Eveland A in 2 weeks.   Contact information:   [facility] [address], [address] [place] [address] [phone]          Signed: Jacki Cones 05/17/2011, 9:34 PM

## 2011-05-17 NOTE — Progress Notes (Signed)
Subjective: Doing well,wound looks fine.   Objective: Vital signs in last 24 hours: Temp:  [98.5 F (36.9 C)-99.1 F (37.3 C)] 98.8 F (37.1 C) (11/30 0605) Pulse Rate:  [76-83] 77  (11/30 0605) Resp:  [12-20] 12  (11/30 0605) BP: (98-111)/(60-66) 98/60 mmHg (11/30 0605) SpO2:  [98 %-99 %] 98 % (11/30 0605)  Intake/Output from previous day: 11/29 0701 - 11/30 0700 In: 3111.7 [P.O.:1260; I.V.:1851.7] Out: 2300 [Urine:2300] Intake/Output this shift: Total I/O In: 841.7 [P.O.:240; I.V.:601.7] Out: 800 [Urine:800]   Basename 05/17/11 0445 05/16/11 0405 05/14/11 1950 05/14/11 1215  HGB 11.1* 10.9* 12.1 13.3    Basename 05/17/11 0445 05/16/11 0405  WBC 6.6 6.4  RBC 3.68* 3.76*  HCT 33.5* 34.4*  PLT 190 189    Basename 05/17/11 0445 05/16/11 0405  NA 135 140  K 3.9 3.8  CL 100 103  CO2 31 31  BUN 11 8  CREATININE 0.61 0.58  GLUCOSE 106* 100*  CALCIUM 8.6 8.8    Basename 05/17/11 0445 05/16/11 0405  LABPT -- --  INR 1.16 1.08    Neurologically intact Neurovascular intact Dorsiflexion/Plantar flexion intact  Assessment/Plan: DC to SNF on SAT.   Marie Coleman A 05/17/2011, 6:49 AM

## 2011-05-18 LAB — CBC
MCH: 28.8 pg (ref 26.0–34.0)
MCHC: 31.5 g/dL (ref 30.0–36.0)
MCV: 91.4 fL (ref 78.0–100.0)
Platelets: 205 10*3/uL (ref 150–400)
RDW: 12.8 % (ref 11.5–15.5)

## 2011-05-18 NOTE — Plan of Care (Signed)
Problem: Discharge Progression Outcomes Goal: Discharge plan in place and appropriate Outcome: Completed/Met Date Met:  05/18/11 snf

## 2011-05-18 NOTE — Progress Notes (Signed)
Discharged from floor via stretcher, accomp by spouse and EMT.  No changes in assessment. Salote Weidmann, [employer]

## 2011-05-18 NOTE — Progress Notes (Signed)
Subjective: Doing Well today   Objective: Vital signs in last 24 hours: Temp:  [98.5 F (36.9 C)-99 F (37.2 C)] 98.8 F (37.1 C) (12/01 0510) Pulse Rate:  [75-82] 75  (12/01 0510) Resp:  [16] 16  (12/01 0510) BP: (95-115)/(62-72) 115/72 mmHg (12/01 0510) SpO2:  [98 %-99 %] 98 % (12/01 0510)  Intake/Output from previous day: 11/30 0701 - 12/01 0700 In: 1608.1 [P.O.:960; I.V.:648.1] Out: 976 [Urine:975; Stool:1] Intake/Output this shift:     Basename 05/18/11 0436 05/17/11 0445 05/16/11 0405  HGB 10.4* 11.1* 10.9*    Basename 05/18/11 0436 05/17/11 0445  WBC 5.5 6.6  RBC 3.61* 3.68*  HCT 33.0* 33.5*  PLT 205 190    Basename 05/17/11 0445 05/16/11 0405  NA 135 140  K 3.9 3.8  CL 100 103  CO2 31 31  BUN 11 8  CREATININE 0.61 0.58  GLUCOSE 106* 100*  CALCIUM 8.6 8.8    Basename 05/18/11 0436 05/17/11 0445  LABPT -- --  INR 1.20 1.16    Neurologically intact Neurovascular intact Dorsiflexion/Plantar flexion intact  Assessment/Plan: DC Today   Debbora Ang A 05/18/2011, 7:45 AM

## 2011-05-21 ENCOUNTER — Encounter (HOSPITAL_COMMUNITY): Payer: Self-pay | Admitting: Orthopedic Surgery

## 2011-05-27 NOTE — Anesthesia Postprocedure Evaluation (Signed)
  Anesthesia Post-op Note  Patient: Marie Coleman  Procedure(s) Performed:  CANNULATED HIP PINNING - Cannulated Right Hip Pinning   (c-arm)   Patient Location: PACU  Anesthesia Type: General  Level of Consciousness: oriented and sedated  Airway and Oxygen Therapy: Patient Spontanous Breathing and Patient connected to nasal cannula oxygen  Post-op Pain: mild  Post-op Assessment: Post-op Vital signs reviewed, Patient's Cardiovascular Status Stable, Respiratory Function Stable and Patent Airway  Post-op Vital Signs: stable  Complications: No apparent anesthesia complications

## 2011-06-08 DIAGNOSIS — Z7901 Long term (current) use of anticoagulants: Secondary | ICD-10-CM | POA: Diagnosis not present

## 2011-06-08 DIAGNOSIS — R269 Unspecified abnormalities of gait and mobility: Secondary | ICD-10-CM | POA: Diagnosis not present

## 2011-06-08 DIAGNOSIS — W010XXA Fall on same level from slipping, tripping and stumbling without subsequent striking against object, initial encounter: Secondary | ICD-10-CM | POA: Diagnosis not present

## 2011-06-08 DIAGNOSIS — S72009D Fracture of unspecified part of neck of unspecified femur, subsequent encounter for closed fracture with routine healing: Secondary | ICD-10-CM | POA: Diagnosis not present

## 2011-06-08 DIAGNOSIS — Z5181 Encounter for therapeutic drug level monitoring: Secondary | ICD-10-CM | POA: Diagnosis not present

## 2011-06-12 DIAGNOSIS — W010XXA Fall on same level from slipping, tripping and stumbling without subsequent striking against object, initial encounter: Secondary | ICD-10-CM | POA: Diagnosis not present

## 2011-06-12 DIAGNOSIS — S72009D Fracture of unspecified part of neck of unspecified femur, subsequent encounter for closed fracture with routine healing: Secondary | ICD-10-CM | POA: Diagnosis not present

## 2011-06-12 DIAGNOSIS — Z7901 Long term (current) use of anticoagulants: Secondary | ICD-10-CM | POA: Diagnosis not present

## 2011-06-12 DIAGNOSIS — R269 Unspecified abnormalities of gait and mobility: Secondary | ICD-10-CM | POA: Diagnosis not present

## 2011-06-12 DIAGNOSIS — Z5181 Encounter for therapeutic drug level monitoring: Secondary | ICD-10-CM | POA: Diagnosis not present

## 2011-06-13 DIAGNOSIS — S72009D Fracture of unspecified part of neck of unspecified femur, subsequent encounter for closed fracture with routine healing: Secondary | ICD-10-CM | POA: Diagnosis not present

## 2011-06-13 DIAGNOSIS — Z7901 Long term (current) use of anticoagulants: Secondary | ICD-10-CM | POA: Diagnosis not present

## 2011-06-13 DIAGNOSIS — R269 Unspecified abnormalities of gait and mobility: Secondary | ICD-10-CM | POA: Diagnosis not present

## 2011-06-13 DIAGNOSIS — Z5181 Encounter for therapeutic drug level monitoring: Secondary | ICD-10-CM | POA: Diagnosis not present

## 2011-06-13 DIAGNOSIS — W010XXA Fall on same level from slipping, tripping and stumbling without subsequent striking against object, initial encounter: Secondary | ICD-10-CM | POA: Diagnosis not present

## 2011-06-14 DIAGNOSIS — R269 Unspecified abnormalities of gait and mobility: Secondary | ICD-10-CM | POA: Diagnosis not present

## 2011-06-14 DIAGNOSIS — W010XXA Fall on same level from slipping, tripping and stumbling without subsequent striking against object, initial encounter: Secondary | ICD-10-CM | POA: Diagnosis not present

## 2011-06-14 DIAGNOSIS — S72009D Fracture of unspecified part of neck of unspecified femur, subsequent encounter for closed fracture with routine healing: Secondary | ICD-10-CM | POA: Diagnosis not present

## 2011-06-14 DIAGNOSIS — Z5181 Encounter for therapeutic drug level monitoring: Secondary | ICD-10-CM | POA: Diagnosis not present

## 2011-06-14 DIAGNOSIS — Z7901 Long term (current) use of anticoagulants: Secondary | ICD-10-CM | POA: Diagnosis not present

## 2011-06-15 ENCOUNTER — Other Ambulatory Visit: Payer: Self-pay | Admitting: Orthopedic Surgery

## 2011-06-17 DIAGNOSIS — S72009D Fracture of unspecified part of neck of unspecified femur, subsequent encounter for closed fracture with routine healing: Secondary | ICD-10-CM | POA: Diagnosis not present

## 2011-06-17 DIAGNOSIS — Z5181 Encounter for therapeutic drug level monitoring: Secondary | ICD-10-CM | POA: Diagnosis not present

## 2011-06-17 DIAGNOSIS — R269 Unspecified abnormalities of gait and mobility: Secondary | ICD-10-CM | POA: Diagnosis not present

## 2011-06-17 DIAGNOSIS — W010XXA Fall on same level from slipping, tripping and stumbling without subsequent striking against object, initial encounter: Secondary | ICD-10-CM | POA: Diagnosis not present

## 2011-06-17 DIAGNOSIS — Z7901 Long term (current) use of anticoagulants: Secondary | ICD-10-CM | POA: Diagnosis not present

## 2011-06-19 DIAGNOSIS — Z7901 Long term (current) use of anticoagulants: Secondary | ICD-10-CM | POA: Diagnosis not present

## 2011-06-19 DIAGNOSIS — W010XXA Fall on same level from slipping, tripping and stumbling without subsequent striking against object, initial encounter: Secondary | ICD-10-CM | POA: Diagnosis not present

## 2011-06-19 DIAGNOSIS — R269 Unspecified abnormalities of gait and mobility: Secondary | ICD-10-CM | POA: Diagnosis not present

## 2011-06-19 DIAGNOSIS — Z5181 Encounter for therapeutic drug level monitoring: Secondary | ICD-10-CM | POA: Diagnosis not present

## 2011-06-19 DIAGNOSIS — S72009D Fracture of unspecified part of neck of unspecified femur, subsequent encounter for closed fracture with routine healing: Secondary | ICD-10-CM | POA: Diagnosis not present

## 2011-06-21 DIAGNOSIS — Z5181 Encounter for therapeutic drug level monitoring: Secondary | ICD-10-CM | POA: Diagnosis not present

## 2011-06-21 DIAGNOSIS — Z7901 Long term (current) use of anticoagulants: Secondary | ICD-10-CM | POA: Diagnosis not present

## 2011-06-21 DIAGNOSIS — S72009A Fracture of unspecified part of neck of unspecified femur, initial encounter for closed fracture: Secondary | ICD-10-CM | POA: Diagnosis not present

## 2011-06-21 DIAGNOSIS — R269 Unspecified abnormalities of gait and mobility: Secondary | ICD-10-CM | POA: Diagnosis not present

## 2011-06-21 DIAGNOSIS — S72009D Fracture of unspecified part of neck of unspecified femur, subsequent encounter for closed fracture with routine healing: Secondary | ICD-10-CM | POA: Diagnosis not present

## 2011-06-21 DIAGNOSIS — W010XXA Fall on same level from slipping, tripping and stumbling without subsequent striking against object, initial encounter: Secondary | ICD-10-CM | POA: Diagnosis not present

## 2011-06-24 DIAGNOSIS — W010XXA Fall on same level from slipping, tripping and stumbling without subsequent striking against object, initial encounter: Secondary | ICD-10-CM | POA: Diagnosis not present

## 2011-06-24 DIAGNOSIS — R269 Unspecified abnormalities of gait and mobility: Secondary | ICD-10-CM | POA: Diagnosis not present

## 2011-06-24 DIAGNOSIS — Z7901 Long term (current) use of anticoagulants: Secondary | ICD-10-CM | POA: Diagnosis not present

## 2011-06-24 DIAGNOSIS — Z5181 Encounter for therapeutic drug level monitoring: Secondary | ICD-10-CM | POA: Diagnosis not present

## 2011-06-24 DIAGNOSIS — S72009D Fracture of unspecified part of neck of unspecified femur, subsequent encounter for closed fracture with routine healing: Secondary | ICD-10-CM | POA: Diagnosis not present

## 2011-06-26 DIAGNOSIS — S72009D Fracture of unspecified part of neck of unspecified femur, subsequent encounter for closed fracture with routine healing: Secondary | ICD-10-CM | POA: Diagnosis not present

## 2011-06-26 DIAGNOSIS — Z5181 Encounter for therapeutic drug level monitoring: Secondary | ICD-10-CM | POA: Diagnosis not present

## 2011-06-26 DIAGNOSIS — Z7901 Long term (current) use of anticoagulants: Secondary | ICD-10-CM | POA: Diagnosis not present

## 2011-06-26 DIAGNOSIS — W010XXA Fall on same level from slipping, tripping and stumbling without subsequent striking against object, initial encounter: Secondary | ICD-10-CM | POA: Diagnosis not present

## 2011-06-26 DIAGNOSIS — R269 Unspecified abnormalities of gait and mobility: Secondary | ICD-10-CM | POA: Diagnosis not present

## 2011-06-28 DIAGNOSIS — R269 Unspecified abnormalities of gait and mobility: Secondary | ICD-10-CM | POA: Diagnosis not present

## 2011-06-28 DIAGNOSIS — Z5181 Encounter for therapeutic drug level monitoring: Secondary | ICD-10-CM | POA: Diagnosis not present

## 2011-06-28 DIAGNOSIS — S72009D Fracture of unspecified part of neck of unspecified femur, subsequent encounter for closed fracture with routine healing: Secondary | ICD-10-CM | POA: Diagnosis not present

## 2011-06-28 DIAGNOSIS — Z7901 Long term (current) use of anticoagulants: Secondary | ICD-10-CM | POA: Diagnosis not present

## 2011-06-28 DIAGNOSIS — W010XXA Fall on same level from slipping, tripping and stumbling without subsequent striking against object, initial encounter: Secondary | ICD-10-CM | POA: Diagnosis not present

## 2011-06-30 DIAGNOSIS — Z7901 Long term (current) use of anticoagulants: Secondary | ICD-10-CM | POA: Diagnosis not present

## 2011-06-30 DIAGNOSIS — R269 Unspecified abnormalities of gait and mobility: Secondary | ICD-10-CM | POA: Diagnosis not present

## 2011-06-30 DIAGNOSIS — Z5181 Encounter for therapeutic drug level monitoring: Secondary | ICD-10-CM | POA: Diagnosis not present

## 2011-06-30 DIAGNOSIS — S72009D Fracture of unspecified part of neck of unspecified femur, subsequent encounter for closed fracture with routine healing: Secondary | ICD-10-CM | POA: Diagnosis not present

## 2011-06-30 DIAGNOSIS — W010XXA Fall on same level from slipping, tripping and stumbling without subsequent striking against object, initial encounter: Secondary | ICD-10-CM | POA: Diagnosis not present

## 2011-07-03 DIAGNOSIS — Z7901 Long term (current) use of anticoagulants: Secondary | ICD-10-CM | POA: Diagnosis not present

## 2011-07-03 DIAGNOSIS — W010XXA Fall on same level from slipping, tripping and stumbling without subsequent striking against object, initial encounter: Secondary | ICD-10-CM | POA: Diagnosis not present

## 2011-07-03 DIAGNOSIS — R269 Unspecified abnormalities of gait and mobility: Secondary | ICD-10-CM | POA: Diagnosis not present

## 2011-07-03 DIAGNOSIS — Z5181 Encounter for therapeutic drug level monitoring: Secondary | ICD-10-CM | POA: Diagnosis not present

## 2011-07-03 DIAGNOSIS — S72009D Fracture of unspecified part of neck of unspecified femur, subsequent encounter for closed fracture with routine healing: Secondary | ICD-10-CM | POA: Diagnosis not present

## 2011-07-09 DIAGNOSIS — S72009A Fracture of unspecified part of neck of unspecified femur, initial encounter for closed fracture: Secondary | ICD-10-CM | POA: Diagnosis not present

## 2011-07-26 DIAGNOSIS — M810 Age-related osteoporosis without current pathological fracture: Secondary | ICD-10-CM | POA: Diagnosis not present

## 2011-08-01 DIAGNOSIS — I8310 Varicose veins of unspecified lower extremity with inflammation: Secondary | ICD-10-CM | POA: Diagnosis not present

## 2011-08-05 DIAGNOSIS — IMO0002 Reserved for concepts with insufficient information to code with codable children: Secondary | ICD-10-CM | POA: Diagnosis not present

## 2011-08-05 DIAGNOSIS — Z136 Encounter for screening for cardiovascular disorders: Secondary | ICD-10-CM | POA: Diagnosis not present

## 2011-08-05 DIAGNOSIS — Z01810 Encounter for preprocedural cardiovascular examination: Secondary | ICD-10-CM | POA: Diagnosis not present

## 2011-08-05 DIAGNOSIS — M1710 Unilateral primary osteoarthritis, unspecified knee: Secondary | ICD-10-CM | POA: Diagnosis not present

## 2011-08-05 DIAGNOSIS — Z01811 Encounter for preprocedural respiratory examination: Secondary | ICD-10-CM | POA: Diagnosis not present

## 2011-08-08 DIAGNOSIS — S72009A Fracture of unspecified part of neck of unspecified femur, initial encounter for closed fracture: Secondary | ICD-10-CM | POA: Diagnosis not present

## 2011-08-29 DIAGNOSIS — S72009A Fracture of unspecified part of neck of unspecified femur, initial encounter for closed fracture: Secondary | ICD-10-CM | POA: Diagnosis not present

## 2011-09-05 DIAGNOSIS — S72009A Fracture of unspecified part of neck of unspecified femur, initial encounter for closed fracture: Secondary | ICD-10-CM | POA: Diagnosis not present

## 2011-09-11 ENCOUNTER — Encounter (HOSPITAL_COMMUNITY): Payer: Self-pay | Admitting: Pharmacy Technician

## 2011-09-17 ENCOUNTER — Encounter (HOSPITAL_COMMUNITY): Payer: Self-pay

## 2011-09-17 ENCOUNTER — Encounter (HOSPITAL_COMMUNITY)
Admission: RE | Admit: 2011-09-17 | Discharge: 2011-09-17 | Disposition: A | Discharge status: Home / Self Care | Payer: Medicare Other | Source: Ambulatory Visit | Attending: Orthopedic Surgery | Admitting: Orthopedic Surgery

## 2011-09-17 HISTORY — DX: Dizziness and giddiness: R42

## 2011-09-17 HISTORY — DX: Other seasonal allergic rhinitis: J30.2

## 2011-09-17 HISTORY — DX: Unspecified osteoarthritis, unspecified site: M19.90

## 2011-09-17 LAB — URINALYSIS, ROUTINE W REFLEX MICROSCOPIC
Bilirubin Urine: NEGATIVE
Glucose, UA: NEGATIVE mg/dL
Hgb urine dipstick: NEGATIVE
Specific Gravity, Urine: 1.017 (ref 1.005–1.030)
Urobilinogen, UA: 0.2 mg/dL (ref 0.0–1.0)
pH: 5 (ref 5.0–8.0)

## 2011-09-17 LAB — COMPREHENSIVE METABOLIC PANEL
ALT: 9 U/L (ref 0–35)
AST: 20 U/L (ref 0–37)
Albumin: 4.1 g/dL (ref 3.5–5.2)
Alkaline Phosphatase: 56 U/L (ref 39–117)
Chloride: 99 mEq/L (ref 96–112)
Potassium: 4.3 mEq/L (ref 3.5–5.1)
Sodium: 136 mEq/L (ref 135–145)
Total Bilirubin: 0.4 mg/dL (ref 0.3–1.2)
Total Protein: 7.9 g/dL (ref 6.0–8.3)

## 2011-09-17 LAB — URINE MICROSCOPIC-ADD ON

## 2011-09-17 LAB — CBC
HCT: 42.8 % (ref 36.0–46.0)
MCHC: 31.5 g/dL (ref 30.0–36.0)
RDW: 12.9 % (ref 11.5–15.5)
WBC: 5.2 10*3/uL (ref 4.0–10.5)

## 2011-09-17 LAB — SURGICAL PCR SCREEN: Staphylococcus aureus: NEGATIVE

## 2011-09-17 LAB — APTT: aPTT: 33 seconds (ref 24–37)

## 2011-09-17 NOTE — Patient Instructions (Signed)
YOUR SURGERY IS SCHEDULED ON:  Monday  4/8  AT 11:15 AM  REPORT TO [facility] AT:  8:45 AM      PHONE # FOR SHORT STAY IS [phone]  DO NOT EAT OR DRINK ANYTHING AFTER MIDNIGHT THE NIGHT BEFORE YOUR SURGERY.  YOU MAY BRUSH YOUR TEETH, RINSE OUT YOUR MOUTH--BUT NO WATER, NO FOOD, NO CHEWING GUM, NO MINTS, NO CANDIES, NO CHEWING TOBACCO.  PLEASE TAKE THE FOLLOWING MEDICATIONS THE AM OF YOUR SURGERY WITH A FEW SIPS OF WATER:  ALLEGRA  AND MAY WEAR VIVELLE DOT PATCH    IF YOU USE INHALERS--USE YOUR INHALERS THE AM OF YOUR SURGERY AND BRING INHALERS TO THE HOSPITAL -TAKE TO SURGERY.    IF YOU ARE DIABETIC:  DO NOT TAKE ANY DIABETIC MEDICATIONS THE AM OF YOUR SURGERY.  IF YOU TAKE INSULIN IN THE EVENINGS--PLEASE ONLY TAKE 1/2 NORMAL EVENING DOSE THE NIGHT BEFORE YOUR SURGERY.  NO INSULIN THE AM OF YOUR SURGERY.  IF YOU HAVE SLEEP APNEA AND USE CPAP OR BIPAP--PLEASE BRING THE MASK --NOT THE MACHINE-NOT THE TUBING   -JUST THE MASK. DO NOT BRING VALUABLES, MONEY, CREDIT CARDS.  CONTACT LENS, DENTURES / PARTIALS, GLASSES SHOULD NOT BE WORN TO SURGERY AND IN MOST CASES-HEARING AIDS WILL NEED TO BE REMOVED.  BRING YOUR GLASSES CASE, ANY EQUIPMENT NEEDED FOR YOUR CONTACT LENS. FOR PATIENTS ADMITTED TO THE HOSPITAL--CHECK OUT TIME THE DAY OF DISCHARGE IS 11:00 AM.  ALL INPATIENT ROOMS ARE PRIVATE - WITH BATHROOM, TELEPHONE, TELEVISION AND WIFI INTERNET. IF YOU ARE BEING DISCHARGED THE SAME DAY OF YOUR SURGERY--YOU CAN NOT DRIVE YOURSELF HOME--AND SHOULD NOT GO HOME ALONE BY TAXI OR BUS.  NO DRIVING OR OPERATING MACHINERY FOR 24 HOURS FOLLOWING ANESTHESIA / PAIN MEDICATIONS.                            SPECIAL INSTRUCTIONS:  CHLORHEXIDINE SOAP SHOWER (other brand names are Betasept and Hibiclens ) PLEASE SHOWER WITH CHLORHEXIDINE THE NIGHT BEFORE YOUR SURGERY AND THE AM OF YOUR SURGERY. DO NOT USE CHLORHEXIDINE ON YOUR FACE OR PRIVATE AREAS--YOU MAY USE YOUR NORMAL SOAP THOSE AREAS AND  YOUR NORMAL SHAMPOO.  WOMEN SHOULD AVOID SHAVING UNDER ARMS AND SHAVING LEGS 48 HOURS BEFORE USING CHLORHEXIDINE TO AVOID SKIN IRRITATION.  DO NOT USE IF ALLERGIC TO CHLORHEXIDINE.  PLEASE READ OVER ANY  FACT SHEETS THAT YOU WERE GIVEN: MRSA INFORMATION, BLOOD TRANSFUSION INFORMATION, INCENTIVE SPIROMETER INFORMATION.

## 2011-09-17 NOTE — Pre-Procedure Instructions (Signed)
EKG AND CXR REPORTS FROM [facility] DONE 05/14/11 IN EPIC AND COPIES ARE ALSO ON PT'S CHART. CBC, CMET, PT, PTT, UA WERE DONE TODAY PREOP --T/S TO BE DRAWN ON ARRIVAL FOR SURGERY.

## 2011-09-22 ENCOUNTER — Other Ambulatory Visit: Payer: Self-pay | Admitting: Orthopedic Surgery

## 2011-09-22 MED ORDER — CEFAZOLIN SODIUM 1-5 GM-% IV SOLN: 1.0000 g | INTRAVENOUS | Status: DC

## 2011-09-22 NOTE — H&P (Signed)
Marie Coleman  DOB: 12/25/1940 Married / Language: English / Race: White Female  Date of Admission:  09/23/2011  Chief Complaint:  Left Knee Pain  History of Present Illness The patient is a 70 year old female who comes in for a preoperative History and Physical. The patient is scheduled for a left total knee arthroplasty to be performed by Dr. Frank V. Aluisio, MD at [facility] on 09/23/2011. The patient is a 70 year old female who presents for follow up of their knee. The patient is being followed for their left knee pain and osteoarthritis. The patient has had a cortisone injection by Dr. Gioffre (did have some relief). The patient indicates that they have questions or concerns regarding pain (any other treatment to help). Marie Coleman a couple of years ago and it was noted that she had advanced arthritis in the knee at that time. Now the knee is hurting constantly. It is limiting what she can and cannot do. She is at a stage where she wants to get the knee replaced. The patient was going to do in the recent past but then unfortunately had a fall a wyhile back sustaining a femoral neck fracture. Dr. Gioffre fixed this. When he saw her recently for followup, her left knee was hurting badly so he gave her a cortisone injection. The patient states that it has helped slightly but she still feels that the knee is limiting her significantly. She is now ready for the knee replacement surgery. The pain is occurring at all times and limiting what she can and cannot do. This was present before the patient fractured her hip. She states that the knee does not swell. She does have limited mobility. It interferes with her ability to do activities of daily living. She now presents for total knee replacement surgery. They have been treated conservatively in the past for the above stated problem and despite conservative measures, they continue to have progressive pain and severe functional  limitations and dysfunction. They have failed non-operative management including home exercise, medications, and injections. It is felt that they would benefit from undergoing total joint replacement. Risks and benefits of the procedure have been discussed with the patient and they elect to proceed with surgery. There are no active contraindications to surgery such as ongoing infection or rapidly progressive neurological disease.  Allergies SULFA. 10/06/2001 Rash.  Past Medical History Osteoarthritis Osteoporosis Vertigo   Family History Osteoarthritis. mother and father Father. Deceased, Myocardial infarction. age 68, History of Thrombocytopenia Mother. Living. age 92   Social History Tobacco use. Never smoker. never smoker Alcohol use. current drinker; drinks beer and hard liquor; only occasionally per week Children. 2 Current work status. working part time Drug/Alcohol Rehab (Currently). no Drug/Alcohol Rehab (Previously). no Exercise. Exercises daily; does running / walking and individual sport Illicit drug use. no Living situation. live with spouse Marital status. married Number of flights of stairs before winded. 2-3 Pain Contract. no Tobacco / smoke exposure. no Post-Surgical Plans. Plan is to go to [facility]. Advance Directives. Healthcare POA   Medication History Vitamin D (1 Oral) Specific dose unknown - Active. Hydrocodone-Acetaminophen (5-325MG Tablet, Oral) Active. (prn) Aspirin Adult Low Strength (81MG Tablet Chewable, Oral) Active. Ibuprofen (200MG Tablet, Oral) Active. Vivelle-Dot (0.0375MG/24HR Patch Biweekly, Transdermal) Active.  Past Surgical History Arthroscopy of Knee. left Hip Fracture and Surgery. Right Hysterectomy. complete (non-cancerous) Neck Disc Surgery Spinal Surgery. Lower Back Surgery Wrist Surgery. Left Wrist Fracture - ORIF - Dr. Norris Rotator Cuff   Repair - Left. Date: 2012.  Review of  Systems General:Not Present- Chills, Fever, Night Sweats, Appetite Loss, Fatigue, Feeling sick, Weight Gain and Weight Loss. Skin:Not Present- Itching, Rash, Skin Color Changes, Ulcer, Psoriasis and Change in Hair or Nails. HEENT:Not Present- Sensitivity to light, Hearing problems, Nose Bleed and Ringing in the Ears. Neck:Not Present- Swollen Glands and Neck Mass. Respiratory:Not Present- Snoring, Chronic Cough, Bloody sputum and Dyspnea. Cardiovascular:Not Present- Shortness of Breath, Chest Pain, Swelling of Extremities, Leg Cramps and Palpitations. Gastrointestinal:Not Present- Bloody Stool, Heartburn, Abdominal Pain, Vomiting, Nausea and Incontinence of Stool. Female Genitourinary:Not Present- Blood in Urine, Menstrual Irregularities, Frequency, Incontinence and Nocturia. Musculoskeletal:Present- Joint Stiffness and Joint Pain. Not Present- Muscle Weakness, Muscle Pain, Joint Swelling and Back Pain. Neurological:Not Present- Tingling, Numbness, Burning, Tremor, Headaches and Dizziness. Psychiatric:Not Present- Anxiety, Depression and Memory Loss. Endocrine:Not Present- Cold Intolerance, Heat Intolerance, Excessive hunger and Excessive Thirst. Hematology:Not Present- Abnormal Bleeding, Anemia, Blood Clots and Easy Bruising.   Vitals Weight: 127 lb Height: 67 in Body Surface Area: 1.65 m Body Mass Index: 19.89 kg/m Pulse: 72 (Regular) Resp.: 16 (Unlabored) BP: 124/64 (Sitting, Right Arm, Standard)  Physical Exam The physical exam findings are as follows: Patient is a 70 year old female with continued knee pain.   General Mental Status - Alert, cooperative and good historian. General Appearance- Very pleasant. Not in acute distress. Orientation- Oriented X3. Build & Nutrition- Lean, Well nourished and Well developed. Posture- Normal posture.   Head and Neck Head- normocephalic, atraumatic . Neck Global Assessment- supple. no bruit auscultated  on the right and no bruit auscultated on the left.   Eye Pupil- Bilateral- Regular and Round. Motion- Bilateral- EOMI.   Chest and Lung Exam Auscultation: Breath sounds:- clear at anterior chest wall and - clear at posterior chest wall. Adventitious sounds:- No Adventitious sounds.   Cardiovascular Auscultation:Rhythm- Regular rate and rhythm. Heart Sounds- S1 WNL and S2 WNL. Murmurs & Other Heart Sounds:Auscultation of the heart reveals - No Murmurs.   Abdomen Palpation/Percussion:Tenderness- Abdomen is non-tender to palpation. Rigidity (guarding)- Abdomen is soft. Auscultation:Auscultation of the abdomen reveals - Bowel sounds normal.   Female Genitourinary Not done, not pertinent to present illness  Musculoskeletal On examination, well-developed female, alert and oriented, in no apparent distress.  Examination of the left hip, normal range of motion, no discomfort.  Examination of the left knee shows no effusion. She has a varus deformity. She has marked crepitus on range of motion. Range of motion is about 10 to 125 degrees. There is no instability noted.  RADIOGRAPHS: Radiographs are reviewed AP and lateral of the knee showing advanced endstage arthritis worse medial and patellofemoral but also involving the lateral. The patient has significant osteophytes also. She is bone-on-bone medial and patellofemoral.  Assessment & Plan Osteoarthritis Left Knee  Patient is for a Left Total Knee Replacement by Dr. Aluisio.  Plan is to go to [facility] after the surgery.  PCP - Dr. Rankins - Patient has been seen by Dr. Rankin and felt to be stable for surgery.  Drew Ralynn San, PA-C  

## 2011-09-23 ENCOUNTER — Encounter (HOSPITAL_COMMUNITY): Payer: Self-pay

## 2011-09-23 ENCOUNTER — Inpatient Hospital Stay (HOSPITAL_COMMUNITY)
Admission: RE | Admit: 2011-09-23 | Discharge: 2011-09-26 | DRG: 470 | Disposition: A | Discharge status: Skilled Nursing Facility | Payer: Medicare Other | Source: Ambulatory Visit | Attending: Orthopedic Surgery | Admitting: Orthopedic Surgery

## 2011-09-23 ENCOUNTER — Encounter (HOSPITAL_COMMUNITY)
Admission: RE | Disposition: A | Discharge status: Skilled Nursing Facility | Payer: Self-pay | Source: Ambulatory Visit | Attending: Orthopedic Surgery

## 2011-09-23 ENCOUNTER — Ambulatory Visit (HOSPITAL_COMMUNITY): Payer: Medicare Other | Admitting: Anesthesiology

## 2011-09-23 ENCOUNTER — Encounter (HOSPITAL_COMMUNITY): Payer: Self-pay | Admitting: Anesthesiology

## 2011-09-23 DIAGNOSIS — E871 Hypo-osmolality and hyponatremia: Secondary | ICD-10-CM | POA: Diagnosis not present

## 2011-09-23 DIAGNOSIS — Z96659 Presence of unspecified artificial knee joint: Secondary | ICD-10-CM

## 2011-09-23 DIAGNOSIS — I9589 Other hypotension: Secondary | ICD-10-CM | POA: Diagnosis not present

## 2011-09-23 DIAGNOSIS — M25569 Pain in unspecified knee: Secondary | ICD-10-CM | POA: Diagnosis not present

## 2011-09-23 DIAGNOSIS — D62 Acute posthemorrhagic anemia: Secondary | ICD-10-CM | POA: Diagnosis not present

## 2011-09-23 DIAGNOSIS — Z5189 Encounter for other specified aftercare: Secondary | ICD-10-CM | POA: Diagnosis not present

## 2011-09-23 DIAGNOSIS — IMO0002 Reserved for concepts with insufficient information to code with codable children: Secondary | ICD-10-CM | POA: Diagnosis not present

## 2011-09-23 DIAGNOSIS — M1710 Unilateral primary osteoarthritis, unspecified knee: Secondary | ICD-10-CM | POA: Diagnosis not present

## 2011-09-23 DIAGNOSIS — Z01812 Encounter for preprocedural laboratory examination: Secondary | ICD-10-CM

## 2011-09-23 DIAGNOSIS — S8990XA Unspecified injury of unspecified lower leg, initial encounter: Secondary | ICD-10-CM | POA: Diagnosis not present

## 2011-09-23 DIAGNOSIS — K5900 Constipation, unspecified: Secondary | ICD-10-CM | POA: Diagnosis not present

## 2011-09-23 DIAGNOSIS — Z9289 Personal history of other medical treatment: Secondary | ICD-10-CM

## 2011-09-23 DIAGNOSIS — M810 Age-related osteoporosis without current pathological fracture: Secondary | ICD-10-CM | POA: Diagnosis present

## 2011-09-23 DIAGNOSIS — R42 Dizziness and giddiness: Secondary | ICD-10-CM | POA: Diagnosis not present

## 2011-09-23 HISTORY — PX: TOTAL KNEE ARTHROPLASTY: SHX125

## 2011-09-23 LAB — ABO/RH: ABO/RH(D): O POS

## 2011-09-23 SURGERY — ARTHROPLASTY, KNEE, TOTAL
Anesthesia: General | Site: Knee | Laterality: Left | Wound class: Clean

## 2011-09-23 MED ORDER — FENTANYL CITRATE 0.05 MG/ML IJ SOLN
INTRAMUSCULAR | Status: DC | PRN
  Administered 2011-09-23 (×2): 50 ug via INTRAVENOUS

## 2011-09-23 MED ORDER — POLYETHYL GLYCOL-PROPYL GLYCOL 0.4-0.3 % OP SOLN: 1.0000 [drp] | Freq: Two times a day (BID) | OPHTHALMIC | Status: DC | PRN

## 2011-09-23 MED ORDER — PROPOFOL 10 MG/ML IV EMUL
INTRAVENOUS | Status: DC | PRN
  Administered 2011-09-23: 120 mg via INTRAVENOUS

## 2011-09-23 MED ORDER — SODIUM CHLORIDE 0.9 % IR SOLN
Status: DC | PRN
  Administered 2011-09-23: 3000 mL

## 2011-09-23 MED ORDER — SUCCINYLCHOLINE CHLORIDE 20 MG/ML IJ SOLN
INTRAMUSCULAR | Status: DC | PRN
  Administered 2011-09-23: 100 mg via INTRAVENOUS

## 2011-09-23 MED ORDER — LACTATED RINGERS IV SOLN
INTRAVENOUS | Status: DC
  Administered 2011-09-23: 1000 mL via INTRAVENOUS

## 2011-09-23 MED ORDER — TEMAZEPAM 15 MG PO CAPS: 15.0000 mg | ORAL_CAPSULE | Freq: Every evening | ORAL | Status: DC | PRN

## 2011-09-23 MED ORDER — SODIUM CHLORIDE 0.9 % IJ SOLN: 9.0000 mL | INTRAMUSCULAR | Status: DC | PRN

## 2011-09-23 MED ORDER — ONDANSETRON HCL 4 MG PO TABS: 4.0000 mg | ORAL_TABLET | Freq: Four times a day (QID) | ORAL | Status: DC | PRN

## 2011-09-23 MED ORDER — SODIUM CHLORIDE 0.9 % IV SOLN: INTRAVENOUS | Status: DC

## 2011-09-23 MED ORDER — BUPIVACAINE 0.25 % ON-Q PUMP SINGLE CATH 300ML
INJECTION | Status: AC
  Filled 2011-09-23: qty 300

## 2011-09-23 MED ORDER — RIVAROXABAN 10 MG PO TABS
10.0000 mg | ORAL_TABLET | Freq: Every day | ORAL | Status: DC
  Administered 2011-09-24 – 2011-09-26 (×3): 10 mg via ORAL
  Filled 2011-09-23 (×3): qty 1

## 2011-09-23 MED ORDER — DEXAMETHASONE SODIUM PHOSPHATE 10 MG/ML IJ SOLN
10.0000 mg | Freq: Once | INTRAMUSCULAR | Status: DC
  Filled 2011-09-23: qty 1

## 2011-09-23 MED ORDER — DROPERIDOL 2.5 MG/ML IJ SOLN
INTRAMUSCULAR | Status: DC | PRN
  Administered 2011-09-23 (×2): .5 mg via INTRAVENOUS

## 2011-09-23 MED ORDER — POLYETHYLENE GLYCOL 3350 17 G PO PACK
17.0000 g | PACK | Freq: Every day | ORAL | Status: DC | PRN
  Filled 2011-09-23: qty 1

## 2011-09-23 MED ORDER — ACETAMINOPHEN 325 MG PO TABS: 650.0000 mg | ORAL_TABLET | Freq: Four times a day (QID) | ORAL | Status: DC | PRN

## 2011-09-23 MED ORDER — EPHEDRINE SULFATE 50 MG/ML IJ SOLN
INTRAMUSCULAR | Status: DC | PRN
  Administered 2011-09-23: 5 mg via INTRAVENOUS

## 2011-09-23 MED ORDER — DEXTROSE-NACL 5-0.9 % IV SOLN
INTRAVENOUS | Status: DC
  Administered 2011-09-23 – 2011-09-24 (×2): via INTRAVENOUS

## 2011-09-23 MED ORDER — ONDANSETRON HCL 4 MG/2ML IJ SOLN
INTRAMUSCULAR | Status: DC | PRN
  Administered 2011-09-23 (×2): 2 mg via INTRAVENOUS

## 2011-09-23 MED ORDER — METHOCARBAMOL 500 MG PO TABS
500.0000 mg | ORAL_TABLET | Freq: Four times a day (QID) | ORAL | Status: DC | PRN
  Administered 2011-09-24 – 2011-09-26 (×7): 500 mg via ORAL
  Filled 2011-09-23 (×7): qty 1

## 2011-09-23 MED ORDER — MIDAZOLAM HCL 5 MG/5ML IJ SOLN
INTRAMUSCULAR | Status: DC | PRN
  Administered 2011-09-23: 1 mg via INTRAVENOUS

## 2011-09-23 MED ORDER — ACETAMINOPHEN 10 MG/ML IV SOLN
1000.0000 mg | Freq: Once | INTRAVENOUS | Status: AC
  Administered 2011-09-23: 1000 mg via INTRAVENOUS
  Filled 2011-09-23: qty 100

## 2011-09-23 MED ORDER — OXYCODONE HCL 5 MG PO TABS
5.0000 mg | ORAL_TABLET | ORAL | Status: DC | PRN
  Administered 2011-09-24 – 2011-09-26 (×8): 5 mg via ORAL
  Filled 2011-09-23: qty 1
  Filled 2011-09-23: qty 2
  Filled 2011-09-23 (×5): qty 1

## 2011-09-23 MED ORDER — BUPIVACAINE ON-Q PAIN PUMP (FOR ORDER SET NO CHG)
INJECTION | Status: DC
  Filled 2011-09-23: qty 1

## 2011-09-23 MED ORDER — HYDROMORPHONE HCL PF 1 MG/ML IJ SOLN
INTRAMUSCULAR | Status: DC | PRN
  Administered 2011-09-23: 0.5 mg via INTRAVENOUS

## 2011-09-23 MED ORDER — HYDROMORPHONE HCL PF 1 MG/ML IJ SOLN
0.2500 mg | INTRAMUSCULAR | Status: DC | PRN
  Administered 2011-09-23 (×4): 0.5 mg via INTRAVENOUS

## 2011-09-23 MED ORDER — METOCLOPRAMIDE HCL 5 MG/ML IJ SOLN
5.0000 mg | Freq: Three times a day (TID) | INTRAMUSCULAR | Status: DC | PRN
  Administered 2011-09-23: 10 mg via INTRAVENOUS
  Filled 2011-09-23: qty 2

## 2011-09-23 MED ORDER — PHENOL 1.4 % MT LIQD
1.0000 | OROMUCOSAL | Status: DC | PRN
  Filled 2011-09-23: qty 177

## 2011-09-23 MED ORDER — POLYVINYL ALCOHOL 1.4 % OP SOLN
1.0000 [drp] | Freq: Two times a day (BID) | OPHTHALMIC | Status: DC | PRN
  Filled 2011-09-23: qty 15

## 2011-09-23 MED ORDER — ACETAMINOPHEN 650 MG RE SUPP: 650.0000 mg | Freq: Four times a day (QID) | RECTAL | Status: DC | PRN

## 2011-09-23 MED ORDER — DEXAMETHASONE SODIUM PHOSPHATE 10 MG/ML IJ SOLN
INTRAMUSCULAR | Status: DC | PRN
  Administered 2011-09-23: 10 mg via INTRAVENOUS

## 2011-09-23 MED ORDER — LORATADINE 10 MG PO TABS
10.0000 mg | ORAL_TABLET | Freq: Every day | ORAL | Status: DC
  Administered 2011-09-24 – 2011-09-26 (×3): 10 mg via ORAL
  Filled 2011-09-23 (×4): qty 1

## 2011-09-23 MED ORDER — ZOLPIDEM TARTRATE 5 MG PO TABS: 5.0000 mg | ORAL_TABLET | Freq: Every evening | ORAL | Status: DC | PRN

## 2011-09-23 MED ORDER — BISACODYL 10 MG RE SUPP: 10.0000 mg | Freq: Every day | RECTAL | Status: DC | PRN

## 2011-09-23 MED ORDER — LIDOCAINE HCL (CARDIAC) 20 MG/ML IV SOLN
INTRAVENOUS | Status: DC | PRN
  Administered 2011-09-23: 20 mg via INTRAVENOUS

## 2011-09-23 MED ORDER — TRAMADOL HCL 50 MG PO TABS
50.0000 mg | ORAL_TABLET | Freq: Four times a day (QID) | ORAL | Status: DC | PRN
  Administered 2011-09-24 (×2): 50 mg via ORAL
  Filled 2011-09-23: qty 2
  Filled 2011-09-23 (×2): qty 1

## 2011-09-23 MED ORDER — CEFAZOLIN SODIUM 1-5 GM-% IV SOLN
1.0000 g | Freq: Four times a day (QID) | INTRAVENOUS | Status: AC
  Administered 2011-09-23 – 2011-09-24 (×4): 1 g via INTRAVENOUS
  Filled 2011-09-23 (×3): qty 50

## 2011-09-23 MED ORDER — BUPIVACAINE 0.25 % ON-Q PUMP SINGLE CATH 300ML
300.0000 mL | INJECTION | Status: DC
  Filled 2011-09-23: qty 300

## 2011-09-23 MED ORDER — ACETAMINOPHEN 10 MG/ML IV SOLN
1000.0000 mg | Freq: Four times a day (QID) | INTRAVENOUS | Status: AC
  Administered 2011-09-23 – 2011-09-24 (×4): 1000 mg via INTRAVENOUS
  Filled 2011-09-23 (×4): qty 100

## 2011-09-23 MED ORDER — KETAMINE HCL 10 MG/ML IJ SOLN
INTRAMUSCULAR | Status: DC | PRN
  Administered 2011-09-23: 2 mg via INTRAVENOUS
  Administered 2011-09-23: 5 mg via INTRAVENOUS
  Administered 2011-09-23: 2 mg via INTRAVENOUS
  Administered 2011-09-23: 1 mg via INTRAVENOUS

## 2011-09-23 MED ORDER — CISATRACURIUM BESYLATE 2 MG/ML IV SOLN
INTRAVENOUS | Status: DC | PRN
  Administered 2011-09-23: 4 mg via INTRAVENOUS

## 2011-09-23 MED ORDER — CEFAZOLIN SODIUM 1-5 GM-% IV SOLN
INTRAVENOUS | Status: AC
  Filled 2011-09-23: qty 50

## 2011-09-23 MED ORDER — FLEET ENEMA 7-19 GM/118ML RE ENEM: 1.0000 | ENEMA | Freq: Once | RECTAL | Status: AC | PRN

## 2011-09-23 MED ORDER — DOCUSATE SODIUM 100 MG PO CAPS
100.0000 mg | ORAL_CAPSULE | Freq: Two times a day (BID) | ORAL | Status: DC
  Administered 2011-09-24 – 2011-09-26 (×5): 100 mg via ORAL
  Filled 2011-09-23 (×6): qty 1

## 2011-09-23 MED ORDER — DIPHENHYDRAMINE HCL 50 MG/ML IJ SOLN: 12.5000 mg | Freq: Four times a day (QID) | INTRAMUSCULAR | Status: DC | PRN

## 2011-09-23 MED ORDER — METOCLOPRAMIDE HCL 10 MG PO TABS: 5.0000 mg | ORAL_TABLET | Freq: Three times a day (TID) | ORAL | Status: DC | PRN

## 2011-09-23 MED ORDER — HYDROMORPHONE HCL PF 1 MG/ML IJ SOLN
INTRAMUSCULAR | Status: AC
  Filled 2011-09-23: qty 1

## 2011-09-23 MED ORDER — ACETAMINOPHEN 10 MG/ML IV SOLN
INTRAVENOUS | Status: AC
  Filled 2011-09-23: qty 100

## 2011-09-23 MED ORDER — SODIUM CHLORIDE 0.9 % IR SOLN
Status: DC | PRN
  Administered 2011-09-23: 1000 mL

## 2011-09-23 MED ORDER — ONDANSETRON HCL 4 MG/2ML IJ SOLN
4.0000 mg | Freq: Four times a day (QID) | INTRAMUSCULAR | Status: DC | PRN
  Administered 2011-09-23: 4 mg via INTRAVENOUS
  Filled 2011-09-23: qty 2

## 2011-09-23 MED ORDER — MENTHOL 3 MG MT LOZG
1.0000 | LOZENGE | OROMUCOSAL | Status: DC | PRN
  Filled 2011-09-23: qty 9

## 2011-09-23 MED ORDER — DIPHENHYDRAMINE HCL 12.5 MG/5ML PO ELIX: 12.5000 mg | ORAL_SOLUTION | ORAL | Status: DC | PRN

## 2011-09-23 MED ORDER — METHOCARBAMOL 100 MG/ML IJ SOLN
500.0000 mg | Freq: Four times a day (QID) | INTRAVENOUS | Status: DC | PRN
  Administered 2011-09-23 (×2): 500 mg via INTRAVENOUS
  Filled 2011-09-23 (×2): qty 5

## 2011-09-23 MED ORDER — BUPIVACAINE 0.25 % ON-Q PUMP SINGLE CATH 300ML
INJECTION | Status: DC | PRN
  Administered 2011-09-23: 300 mL

## 2011-09-23 MED ORDER — LACTATED RINGERS IV SOLN
INTRAVENOUS | Status: DC | PRN
  Administered 2011-09-23 (×2): via INTRAVENOUS

## 2011-09-23 MED ORDER — ONDANSETRON HCL 4 MG/2ML IJ SOLN: 4.0000 mg | Freq: Four times a day (QID) | INTRAMUSCULAR | Status: DC | PRN

## 2011-09-23 MED ORDER — MORPHINE SULFATE (PF) 1 MG/ML IV SOLN
INTRAVENOUS | Status: DC
  Administered 2011-09-23: 17.8 mg via INTRAVENOUS
  Administered 2011-09-23: 5 mg via INTRAVENOUS
  Administered 2011-09-23: 6 mg via INTRAVENOUS
  Administered 2011-09-23: 13:00:00 via INTRAVENOUS
  Filled 2011-09-23: qty 25

## 2011-09-23 MED ORDER — NALOXONE HCL 0.4 MG/ML IJ SOLN: 0.4000 mg | INTRAMUSCULAR | Status: DC | PRN

## 2011-09-23 MED ORDER — CHLORHEXIDINE GLUCONATE 4 % EX LIQD
60.0000 mL | Freq: Once | CUTANEOUS | Status: DC
  Filled 2011-09-23: qty 60

## 2011-09-23 MED ORDER — DIPHENHYDRAMINE HCL 12.5 MG/5ML PO ELIX
12.5000 mg | ORAL_SOLUTION | Freq: Four times a day (QID) | ORAL | Status: DC | PRN
  Filled 2011-09-23: qty 5

## 2011-09-23 MED ORDER — MORPHINE SULFATE (PF) 1 MG/ML IV SOLN
INTRAVENOUS | Status: AC
  Filled 2011-09-23: qty 25

## 2011-09-23 SURGICAL SUPPLY — 54 items
BAG SPEC THK2 15X12 ZIP CLS (MISCELLANEOUS) ×1
BAG ZIPLOCK 12X15 (MISCELLANEOUS) ×2 IMPLANT
BANDAGE ELASTIC 6 VELCRO ST LF (GAUZE/BANDAGES/DRESSINGS) ×2 IMPLANT
BANDAGE ESMARK 6X9 LF (GAUZE/BANDAGES/DRESSINGS) ×1 IMPLANT
BLADE SAG 18X100X1.27 (BLADE) ×2 IMPLANT
BLADE SAW SGTL 11.0X1.19X90.0M (BLADE) ×2 IMPLANT
BNDG CMPR 9X6 STRL LF SNTH (GAUZE/BANDAGES/DRESSINGS) ×1
BNDG ESMARK 6X9 LF (GAUZE/BANDAGES/DRESSINGS) ×2
BOWL SMART MIX CTS (DISPOSABLE) ×2 IMPLANT
CATH KIT ON-Q SILVERSOAK 5 (CATHETERS) ×1 IMPLANT
CATH KIT ON-Q SILVERSOAK 5IN (CATHETERS) ×2 IMPLANT
CEMENT HV SMART SET (Cement) ×4 IMPLANT
CLOTH BEACON ORANGE TIMEOUT ST (SAFETY) ×2 IMPLANT
CUFF TOURN SGL QUICK 34 (TOURNIQUET CUFF) ×2
CUFF TRNQT CYL 34X4X40X1 (TOURNIQUET CUFF) ×1 IMPLANT
DRAPE EXTREMITY T 121X128X90 (DRAPE) ×2 IMPLANT
DRAPE POUCH INSTRU U-SHP 10X18 (DRAPES) ×2 IMPLANT
DRAPE U-SHAPE 47X51 STRL (DRAPES) ×2 IMPLANT
DRSG ADAPTIC 3X8 NADH LF (GAUZE/BANDAGES/DRESSINGS) ×2 IMPLANT
DRSG PAD ABDOMINAL 8X10 ST (GAUZE/BANDAGES/DRESSINGS) ×1 IMPLANT
DURAPREP 26ML APPLICATOR (WOUND CARE) ×2 IMPLANT
ELECT REM PT RETURN 9FT ADLT (ELECTROSURGICAL) ×2
ELECTRODE REM PT RTRN 9FT ADLT (ELECTROSURGICAL) ×1 IMPLANT
EVACUATOR 1/8 PVC DRAIN (DRAIN) ×2 IMPLANT
FACESHIELD LNG OPTICON STERILE (SAFETY) ×10 IMPLANT
GLOVE BIO SURGEON STRL SZ7.5 (GLOVE) ×2 IMPLANT
GLOVE BIO SURGEON STRL SZ8 (GLOVE) ×2 IMPLANT
GLOVE BIOGEL PI IND STRL 8 (GLOVE) ×2 IMPLANT
GLOVE BIOGEL PI INDICATOR 8 (GLOVE) ×2
GOWN STRL NON-REIN LRG LVL3 (GOWN DISPOSABLE) ×3 IMPLANT
GOWN STRL REIN XL XLG (GOWN DISPOSABLE) ×3 IMPLANT
HANDPIECE INTERPULSE COAX TIP (DISPOSABLE) ×2
IMMOBILIZER KNEE 20 (SOFTGOODS) ×2
IMMOBILIZER KNEE 20 THIGH 36 (SOFTGOODS) ×1 IMPLANT
KIT BASIN OR (CUSTOM PROCEDURE TRAY) ×2 IMPLANT
MANIFOLD NEPTUNE II (INSTRUMENTS) ×2 IMPLANT
NS IRRIG 1000ML POUR BTL (IV SOLUTION) ×2 IMPLANT
PACK TOTAL JOINT (CUSTOM PROCEDURE TRAY) ×2 IMPLANT
PAD ABD 7.5X8 STRL (GAUZE/BANDAGES/DRESSINGS) ×2 IMPLANT
PADDING CAST COTTON 6X4 STRL (CAST SUPPLIES) ×4 IMPLANT
POSITIONER SURGICAL ARM (MISCELLANEOUS) ×2 IMPLANT
SET HNDPC FAN SPRY TIP SCT (DISPOSABLE) ×1 IMPLANT
SPONGE GAUZE 4X4 12PLY (GAUZE/BANDAGES/DRESSINGS) ×2 IMPLANT
STRIP CLOSURE SKIN 1/2X4 (GAUZE/BANDAGES/DRESSINGS) ×3 IMPLANT
SUCTION FRAZIER 12FR DISP (SUCTIONS) ×2 IMPLANT
SUT MNCRL AB 4-0 PS2 18 (SUTURE) ×2 IMPLANT
SUT PDS AB 1 CT1 27 (SUTURE) ×6 IMPLANT
SUT VIC AB 2-0 CT1 27 (SUTURE) ×6
SUT VIC AB 2-0 CT1 TAPERPNT 27 (SUTURE) ×3 IMPLANT
SUT VLOC 180 0 24IN GS25 (SUTURE) ×2 IMPLANT
TOWEL OR 17X26 10 PK STRL BLUE (TOWEL DISPOSABLE) ×4 IMPLANT
TRAY FOLEY CATH 14FRSI W/METER (CATHETERS) ×2 IMPLANT
WATER STERILE IRR 1500ML POUR (IV SOLUTION) ×2 IMPLANT
WRAP KNEE MAXI GEL POST OP (GAUZE/BANDAGES/DRESSINGS) ×4 IMPLANT

## 2011-09-23 NOTE — Anesthesia Preprocedure Evaluation (Signed)
Anesthesia Evaluation  Patient identified by MRN, date of birth, ID band Patient awake    Reviewed: Allergy & Precautions, H&P , NPO status , Patient's Chart, lab work & pertinent test results, reviewed documented beta blocker date and time   Airway Mallampati: II TM Distance: >3 FB Neck ROM: Full    Dental  (+) Dental Advisory Given   Pulmonary neg pulmonary ROS,  breath sounds clear to auscultation        Cardiovascular negative cardio ROS  Rhythm:Regular Rate:Normal  Denies cardiac symptoms   Neuro/Psych negative neurological ROS  negative psych ROS   GI/Hepatic negative GI ROS, Neg liver ROS,   Endo/Other  negative endocrine ROS  Renal/GU negative Renal ROS  negative genitourinary   Musculoskeletal   Abdominal   Peds negative pediatric ROS (+)  Hematology negative hematology ROS (+)   Anesthesia Other Findings Upper front caps  Reproductive/Obstetrics negative OB ROS                           Anesthesia Physical Anesthesia Plan  ASA: I  Anesthesia Plan: General   Post-op Pain Management:    Induction: Intravenous  Airway Management Planned: Oral ETT  Additional Equipment:   Intra-op Plan:   Post-operative Plan: Extubation in OR  Informed Consent: I have reviewed the patients History and Physical, chart, labs and discussed the procedure including the risks, benefits and alternatives for the proposed anesthesia with the patient or authorized representative who has indicated his/her understanding and acceptance.   Dental advisory given  Plan Discussed with: CRNA and Surgeon  Anesthesia Plan Comments:         Anesthesia Quick Evaluation

## 2011-09-23 NOTE — H&P (View-Only) (Signed)
Marie Coleman  DOB: 12-Jun-1941 Married / Language: English / Race: White Female  Date of Admission:  09/23/2011  Chief Complaint:  Left Knee Pain  History of Present Illness The patient is a 71 year old female who comes in for a preoperative History and Physical. The patient is scheduled for a left total knee arthroplasty to be performed by Dr. Gus Rankin. Aluisio, MD at [facility] on 09/23/2011. The patient is a 72 year old female who presents for follow up of their knee. The patient is being followed for their left knee pain and osteoarthritis. The patient has had a cortisone injection by Dr. Darrelyn Hillock (did have some relief). The patient indicates that they have questions or concerns regarding pain (any other treatment to help). Marie Coleman a couple of years ago and it was noted that she had advanced arthritis in the knee at that time. Now the knee is hurting constantly. It is limiting what she can and cannot do. She is at a stage where she wants to get the knee replaced. The patient was going to do in the recent past but then unfortunately had a fall a wyhile back sustaining a femoral neck fracture. Dr. Darrelyn Hillock fixed this. When he saw her recently for followup, her left knee was hurting badly so he gave her a cortisone injection. The patient states that it has helped slightly but she still feels that the knee is limiting her significantly. She is now ready for the knee replacement surgery. The pain is occurring at all times and limiting what she can and cannot do. This was present before the patient fractured her hip. She states that the knee does not swell. She does have limited mobility. It interferes with her ability to do activities of daily living. She now presents for total knee replacement surgery. They have been treated conservatively in the past for the above stated problem and despite conservative measures, they continue to have progressive pain and severe functional  limitations and dysfunction. They have failed non-operative management including home exercise, medications, and injections. It is felt that they would benefit from undergoing total joint replacement. Risks and benefits of the procedure have been discussed with the patient and they elect to proceed with surgery. There are no active contraindications to surgery such as ongoing infection or rapidly progressive neurological disease.  Allergies SULFA. 10/06/2001 Rash.  Past Medical History Osteoarthritis Osteoporosis Vertigo   Family History Osteoarthritis. mother and father Father. Deceased, Myocardial infarction. age 61, History of Thrombocytopenia Mother. Living. age 49   Social History Tobacco use. Never smoker. never smoker Alcohol use. current drinker; drinks beer and hard liquor; only occasionally per week Children. 2 Current work status. working part time Drug/Alcohol Rehab (Currently). no Drug/Alcohol Rehab (Previously). no Exercise. Exercises daily; does running / walking and individual sport Illicit drug use. no Living situation. live with spouse Marital status. married Number of flights of stairs before winded. 2-3 Pain Contract. no Tobacco / smoke exposure. no Post-Surgical Plans. Plan is to go to [facility]. Advance Directives. Healthcare POA   Medication History Vitamin D (1 Oral) Specific dose unknown - Active. Hydrocodone-Acetaminophen (5-325MG  Tablet, Oral) Active. (prn) Aspirin Adult Low Strength (81MG  Tablet Chewable, Oral) Active. Ibuprofen (200MG  Tablet, Oral) Active. Vivelle-Dot (0.0375MG /24HR Patch Biweekly, Transdermal) Active.  Past Surgical History Arthroscopy of Knee. left Hip Fracture and Surgery. Right Hysterectomy. complete (non-cancerous) Neck Disc Surgery Spinal Surgery. Lower Back Surgery Wrist Surgery. Left Wrist Fracture - ORIF - Dr. Ranell Patrick Rotator Cuff  Repair - Left. Date: 2012.  Review of  Systems General:Not Present- Chills, Fever, Night Sweats, Appetite Loss, Fatigue, Feeling sick, Weight Gain and Weight Loss. Skin:Not Present- Itching, Rash, Skin Color Changes, Ulcer, Psoriasis and Change in Hair or Nails. HEENT:Not Present- Sensitivity to light, Hearing problems, Nose Bleed and Ringing in the Ears. Neck:Not Present- Swollen Glands and Neck Mass. Respiratory:Not Present- Snoring, Chronic Cough, Bloody sputum and Dyspnea. Cardiovascular:Not Present- Shortness of Breath, Chest Pain, Swelling of Extremities, Leg Cramps and Palpitations. Gastrointestinal:Not Present- Bloody Stool, Heartburn, Abdominal Pain, Vomiting, Nausea and Incontinence of Stool. Female Genitourinary:Not Present- Blood in Urine, Menstrual Irregularities, Frequency, Incontinence and Nocturia. Musculoskeletal:Present- Joint Stiffness and Joint Pain. Not Present- Muscle Weakness, Muscle Pain, Joint Swelling and Back Pain. Neurological:Not Present- Tingling, Numbness, Burning, Tremor, Headaches and Dizziness. Psychiatric:Not Present- Anxiety, Depression and Memory Loss. Endocrine:Not Present- Cold Intolerance, Heat Intolerance, Excessive hunger and Excessive Thirst. Hematology:Not Present- Abnormal Bleeding, Anemia, Blood Clots and Easy Bruising.   Vitals Weight: 127 lb Height: 67 in Body Surface Area: 1.65 m Body Mass Index: 19.89 kg/m Pulse: 72 (Regular) Resp.: 16 (Unlabored) BP: 124/64 (Sitting, Right Arm, Standard)  Physical Exam The physical exam findings are as follows: Patient is a 72 year old female with continued knee pain.   General Mental Status - Alert, cooperative and good historian. General Appearance- Very pleasant. Not in acute distress. Orientation- Oriented X3. Build & Nutrition- Lean, Well nourished and Well developed. Posture- Normal posture.   Head and Neck Head- normocephalic, atraumatic . Neck Global Assessment- supple. no bruit auscultated  on the right and no bruit auscultated on the left.   Eye Pupil- Bilateral- Regular and Round. Motion- Bilateral- EOMI.   Chest and Lung Exam Auscultation: Breath sounds:- clear at anterior chest wall and - clear at posterior chest wall. Adventitious sounds:- No Adventitious sounds.   Cardiovascular Auscultation:Rhythm- Regular rate and rhythm. Heart Sounds- S1 WNL and S2 WNL. Murmurs & Other Heart Sounds:Auscultation of the heart reveals - No Murmurs.   Abdomen Palpation/Percussion:Tenderness- Abdomen is non-tender to palpation. Rigidity (guarding)- Abdomen is soft. Auscultation:Auscultation of the abdomen reveals - Bowel sounds normal.   Female Genitourinary Not done, not pertinent to present illness  Musculoskeletal On examination, well-developed female, alert and oriented, in no apparent distress.  Examination of the left hip, normal range of motion, no discomfort.  Examination of the left knee shows no effusion. She has a varus deformity. She has marked crepitus on range of motion. Range of motion is about 10 to 125 degrees. There is no instability noted.  RADIOGRAPHS: Radiographs are reviewed AP and lateral of the knee showing advanced endstage arthritis worse medial and patellofemoral but also involving the lateral. The patient has significant osteophytes also. She is bone-on-bone medial and patellofemoral.  Assessment & Plan Osteoarthritis Left Knee  Patient is for a Left Total Knee Replacement by Dr. Lequita Halt.  Plan is to go to [facility] after the surgery.  PCP - Dr. Barbaraann Barthel - Patient has been seen by Dr. Luciana Axe and felt to be stable for surgery.  Avel Peace, PA-C

## 2011-09-23 NOTE — Anesthesia Postprocedure Evaluation (Signed)
  Anesthesia Post-op Note  Patient: Marie Coleman  Procedure(s) Performed: Procedure(s) (LRB): TOTAL KNEE ARTHROPLASTY (Left)  Patient Location: PACU  Anesthesia Type: General  Level of Consciousness: oriented and sedated  Airway and Oxygen Therapy: Patient Spontanous Breathing and Patient connected to nasal cannula oxygen  Post-op Pain: mild  Post-op Assessment: Post-op Vital signs reviewed, Patient's Cardiovascular Status Stable, Respiratory Function Stable and Patent Airway  Post-op Vital Signs: stable  Complications: No apparent anesthesia complications

## 2011-09-23 NOTE — Transfer of Care (Signed)
Immediate Anesthesia Transfer of Care Note  Patient: Marie Coleman  Procedure(s) Performed: Procedure(s) (LRB): TOTAL KNEE ARTHROPLASTY (Left)  Patient Location: PACU  Anesthesia Type: General  Level of Consciousness: awake and sedated  Airway & Oxygen Therapy: Patient Spontanous Breathing and Patient connected to face mask  Post-op Assessment: Report given to PACU RN and Post -op Vital signs reviewed and stable  Post vital signs: Reviewed and stable  Complications: No apparent anesthesia complications

## 2011-09-23 NOTE — Interval H&P Note (Signed)
History and Physical Interval Note:  09/23/2011 11:03 AM  Marie Coleman  has presented today for surgery, with the diagnosis of Osteoarthritis of the Left Knee  The various methods of treatment have been discussed with the patient and family. After consideration of risks, benefits and other options for treatment, the patient has consented to  Procedure(s) (LRB): TOTAL KNEE ARTHROPLASTY (Left) as a surgical intervention .  The patients' history has been reviewed, patient examined, no change in status, stable for surgery.  I have reviewed the patients' chart and labs.  Questions were answered to the patient's satisfaction.     Loanne Drilling

## 2011-09-23 NOTE — Op Note (Signed)
Pre-operative diagnosis- Osteoarthritis  Left knee(s)  Post-operative diagnosis- Osteoarthritis Left knee(s)  Procedure-  Left  Total Knee Arthroplasty  Surgeon- Gus Rankin. Madicyn Mesina, MD  Assistant- Avel Peace, PA-C   Anesthesia-  General  EBL-* No blood loss amount entered *   Drains Hemovac  Tourniquet time- 27 minutes @ 300 mmHg  Complications- None  Condition-PACU - hemodynamically stable.   Brief Clinical Note  Marie Coleman is a 71 y.o. year old female with end stage OA of her left knee with progressively worsening pain and dysfunction. She has constant pain, with activity and at rest and significant functional deficits with difficulties even with ADLs. She has had extensive non-op management including analgesics, injections of cortisone and viscosupplements, and home exercise program, but remains in significant pain with significant dysfunction. Radiographs show bone on bone arthritis medial and lateral with tibial subchondral sclerosis. She presents now for left Total Knee Arthroplasty.    Procedure in detail---   The patient is brought into the operating room and positioned supine on the operating table. After successful administration of  General,   a tourniquet is placed high on the  Left thigh(s) and the lower extremity is prepped and draped in the usual sterile fashion. Time out is performed by the operating team and then the  Left lower extremity is wrapped in Esmarch, knee flexed and the tourniquet inflated to 300 mmHg.       A midline incision is made with a ten blade through the subcutaneous tissue to the level of the extensor mechanism. A fresh blade is used to make a medial parapatellar arthrotomy. Soft tissue over the proximal medial tibia is subperiosteally elevated to the joint line with a knife and into the semimembranosus bursa with a Cobb elevator. Soft tissue over the proximal lateral tibia is elevated with attention being paid to avoiding the patellar tendon on the  tibial tubercle. The patella is everted, knee flexed 90 degrees and the ACL and PCL are removed. Findings are bone on bone all 3 compartments with large osteophytes.        The drill is used to create a starting hole in the distal femur and the canal is thoroughly irrigated with sterile saline to remove the fatty contents. The 5 degree Left  valgus alignment guide is placed into the femoral canal and the distal femoral cutting block is pinned to remove 11 mm off the distal femur. Resection is made with an oscillating saw.      The tibia is subluxed forward and the menisci are removed. The extramedullary alignment guide is placed referencing proximally at the medial aspect of the tibial tubercle and distally along the second metatarsal axis and tibial crest. The block is pinned to remove 2mm off the more deficient medial  side. Resection is made with an oscillating saw. Size 3is the most appropriate size for the tibia and the proximal tibia is prepared with the modular drill and keel punch for that size.      The femoral sizing guide is placed and size 4 narrow is most appropriate. Rotation is marked off the epicondylar axis and confirmed by creating a rectangular flexion gap at 90 degrees. The size 4 cutting block is pinned in this rotation and the anterior, posterior and chamfer cuts are made with the oscillating saw. The intercondylar block is then placed and that cut is made.      Trial size 3 tibial component, trial size 4 narrow posterior stabilized femur and a 10  mm posterior stabilized rotating platform insert trial is placed. Full extension is achieved with excellent varus/valgus and anterior/posterior balance throughout full range of motion. The patella is everted and thickness measured to be 22  mm. Free hand resection is taken to 12 mm, a 35 template is placed, lug holes are drilled, trial patella is placed, and it tracks normally. Osteophytes are removed off the posterior femur with the trial in  place. All trials are removed and the cut bone surfaces prepared with pulsatile lavage. Cement is mixed and once ready for implantation, the size 3 tibial implant, size  4 narrow posterior stabilized femoral component, and the size 35 patella are cemented in place and the patella is held with the clamp. The trial insert is placed and the knee held in full extension. All extruded cement is removed and once the cement is hard the permanent 10 mm posterior stabilized rotating platform insert is placed into the tibial tray.      The wound is copiously irrigated with saline solution and the extensor mechanism closed over a hemovac drain with #1 PDS suture. The tourniquet is released for a total tourniquet time of 27  minutes. Flexion against gravity is 140 degrees and the patella tracks normally. Subcutaneous tissue is closed with 2.0 vicryl and subcuticular with running 4.0 Monocryl. The catheter for the Marcaine pain pump is placed and the pump is initiated. The incision is cleaned and dried and steri-strips and a bulky sterile dressing are applied. The limb is placed into a knee immobilizer and the patient is awakened and transported to recovery in stable condition.      Please note that a surgical assistant was a medical necessity for this procedure in order to perform it in a safe and expeditious manner. Surgical assistant was necessary to retract the ligaments and vital neurovascular structures to prevent injury to them and also necessary for proper positioning of the limb to allow for anatomic placement of the prosthesis.   Gus Rankin Kadince Boxley, MD    09/23/2011, 11:58 AM

## 2011-09-23 NOTE — Progress Notes (Signed)
ON-Q-PAIN PUMP INTACT- LEFT KNEE 

## 2011-09-24 ENCOUNTER — Encounter (HOSPITAL_COMMUNITY): Payer: Self-pay | Admitting: Anesthesiology

## 2011-09-24 LAB — CBC
HCT: 29.9 % — ABNORMAL LOW (ref 36.0–46.0)
MCV: 90.3 fL (ref 78.0–100.0)
Platelets: 208 10*3/uL (ref 150–400)
RBC: 3.31 MIL/uL — ABNORMAL LOW (ref 3.87–5.11)
RDW: 13 % (ref 11.5–15.5)
WBC: 8.9 10*3/uL (ref 4.0–10.5)

## 2011-09-24 LAB — BASIC METABOLIC PANEL
CO2: 28 mEq/L (ref 19–32)
Chloride: 101 mEq/L (ref 96–112)
GFR calc Af Amer: >90 mL/min (ref >90)
Potassium: 4 mEq/L (ref 3.5–5.1)

## 2011-09-24 MED ORDER — HYDROMORPHONE HCL PF 1 MG/ML IJ SOLN
0.5000 mg | INTRAMUSCULAR | Status: DC | PRN
  Administered 2011-09-24: 0.5 mg via INTRAVENOUS
  Filled 2011-09-24: qty 1

## 2011-09-24 MED ORDER — POLYSACCHARIDE IRON COMPLEX 150 MG PO CAPS
150.0000 mg | ORAL_CAPSULE | Freq: Every day | ORAL | Status: DC
  Administered 2011-09-24: 150 mg via ORAL
  Filled 2011-09-24 (×2): qty 1

## 2011-09-24 MED ORDER — ALUM & MAG HYDROXIDE-SIMETH 200-200-20 MG/5ML PO SUSP
30.0000 mL | ORAL | Status: DC | PRN
  Administered 2011-09-24: 30 mL via ORAL
  Filled 2011-09-24: qty 30

## 2011-09-24 NOTE — Progress Notes (Signed)
CARE MANAGEMENT NOTE 09/24/2011  Patient:  Marie Coleman, Marie Coleman   Account Number:  [id number]  Date Initiated:  09/24/2011  Documentation initiated by:  Colleen Can  Subjective/Objective Assessment:   dx Osteoarthritis left knee; total knee replacemnt     Action/Plan:   CM spoke with patient. Pt plans to go to skilled facility for rehab   Anticipated DC Date:  09/26/2011   Anticipated DC Plan:  SKILLED NURSING FACILITY  In-house referral  Clinical Social Worker      DC Planning Services  CM consult      [facility] Choice  NA   Choice offered to / List presented to:  NA   DME arranged  NA      DME agency  NA     HH arranged  NA      HH agency  NA   Status of service:  Completed, signed off Medicare Important Message given?  NA - LOS <3 / Initial given by admissions (If response is "NO", the following Medicare IM given date fields will be blank) :    Comments:  09/24/2011 Raynelle Bring BSN CCM [phone] Pt is currently being folowed by Clinical Social Worher; CM signing off.

## 2011-09-24 NOTE — Progress Notes (Signed)
Physical Therapy Treatment Patient Details Name: Marie Coleman MRN: [id number] DOB: [birth year] Today's Date: 09/24/2011  PT Assessment/Plan  PT - Assessment/Plan Comments on Treatment Session: Pt ltd by c/o dizziness BP in recliner 100/60; with feet down 97/55, after return to bed 90/53.  Pt unable to stand for BP - c/o "feel like I will pass out".  After supine x 2 min 119/68 PT Plan: Discharge plan remains appropriate PT Frequency: 7X/week Recommendations for Other Services: OT consult Follow Up Recommendations: Skilled nursing facility Equipment Recommended: Defer to next venue PT Goals  Acute Rehab PT Goals PT Goal Formulation: With patient Time For Goal Achievement: 7 days Pt will go Supine/Side to Sit: with supervision PT Goal: Supine/Side to Sit - Progress: Goal set today Pt will go Sit to Supine/Side: with supervision PT Goal: Sit to Supine/Side - Progress: Goal set today Pt will go Sit to Stand: with supervision PT Goal: Sit to Stand - Progress: Progressing toward goal Pt will go Stand to Sit: with supervision PT Goal: Stand to Sit - Progress: Progressing toward goal Pt will Ambulate: 51 - 150 feet;with supervision;with rolling walker PT Goal: Ambulate - Progress: Not progressing (orthostatic hypotension)  PT Treatment Precautions/Restrictions  Precautions Precautions: Knee Required Braces or Orthoses: Yes Knee Immobilizer: Discontinue once straight leg raise with < 10 degree lag Restrictions Weight Bearing Restrictions: No LLE Weight Bearing: Weight bearing as tolerated Mobility (including Balance) Bed Mobility Bed Mobility: Yes Supine to Sit: 3: Mod assist Supine to Sit Details (indicate cue type and reason): cues for sequence and self assist with UEs Sit to Supine: 1: +2 Total assist Sit to Supine - Details (indicate cue type and reason): cues for sequence - pt 70% Transfers Transfers: Yes Sit to Stand: 4: Min assist Sit to Stand Details (indicate cue type and  reason): cues for use of UEs and for LE management Stand to Sit: 3: Mod assist Stand to Sit Details: cues for use of UEs and for LE management Ambulation/Gait Ambulation/Gait: Yes Ambulation/Gait Assistance: 1: +2 Total assist (pt 75%) Ambulation/Gait Assistance Details (indicate cue type and reason): cues for sequence and position from RW - ltd by increased dizziness Ambulation Distance (Feet): 3 Feet Assistive device: Rolling walker Gait Pattern: Step-to pattern    End of Session PT - End of Session Equipment Utilized During Treatment: Gait belt;Left knee immobilizer Activity Tolerance: Other (comment) Patient left: in bed;with call bell in reach Nurse Communication: Mobility status for transfers;Mobility status for ambulation General Behavior During Session: [facility] for tasks performed Cognition: [facility] for tasks performed  Marie Coleman 09/24/2011, 2:37 PM

## 2011-09-24 NOTE — Progress Notes (Signed)
Subjective: 1 Day Post-Op Procedure(s) (LRB): TOTAL KNEE ARTHROPLASTY (Left) Patient reports pain as mild.   Patient seen in rounds by Dr. Lequita Halt. Patient has complaints of intermittent nausea with the morphine.  DC the Morphine and change to an IV backup of Dilaudid. We will start therapy today. Plan is to go [facility] after hospital stay.  Objective: Vital signs in last 24 hours: Temp:  [96.8 F (36 C)-97.9 F (36.6 C)] 97.3 F (36.3 C) (04/09 0548) Pulse Rate:  [57-79] 69  (04/09 0548) Resp:  [10-99] 16  (04/09 0548) BP: (99-132)/(55-77) 108/66 mmHg (04/09 0548) SpO2:  [97 %-100 %] 100 % (04/09 0548) Weight:  [57.607 kg (127 lb)] 57.607 kg (127 lb) (04/08 1430)  Intake/Output from previous day:  Intake/Output Summary (Last 24 hours) at 09/24/11 0712 Last data filed at 09/24/11 0555  Gross per 24 hour  Intake   2735 ml  Output   1340 ml  Net   1395 ml    Labs:  Basename 09/24/11 0422  HGB 9.7*    Basename 09/24/11 0422  WBC 8.9  RBC 3.31*  HCT 29.9*  PLT 208    Basename 09/24/11 0422  NA 134*  K 4.0  CL 101  CO2 28  BUN 16  CREATININE 0.53  GLUCOSE 140*  CALCIUM 8.3*   No results found for this basename: LABPT:2,INR:2 in the last 72 hours  Exam - Neurovascular intact Sensation intact distally Dressing - clean, dry, no drainage Motor function intact - moving foot and toes well on exam.  Hemovac pulled without difficulty.  Past Medical History  Diagnosis Date  . Vertigo   . Arthritis     OA AND PAIN LEFT KNEE AND PAIN IN SHOULDERS, HANDS, LOWER BACK  . Osteoporosis   . Seasonal allergies     Assessment/Plan: 1 Day Post-Op Procedure(s) (LRB): TOTAL KNEE ARTHROPLASTY (Left) Principal Problem:  *OA (osteoarthritis) of knee   Advance diet Up with therapy Continue foley due to strict I&O and urinary output monitoring Discharge to SNF  DVT Prophylaxis - Xarelto Protocol Weight-Bearing as tolerated to left leg Keep foley until  tomorrow. No vaccines. D/C PCA Morphine, Change to IV push Dilaudid D/C O2 and Pulse OX and try on Room Air  Marie Coleman 09/24/2011, 7:12 AM

## 2011-09-24 NOTE — Addendum Note (Signed)
Addendum  created 09/24/11 1029 by Valeda Malm, CRNA   Modules edited:Anesthesia Medication Administration

## 2011-09-24 NOTE — Evaluation (Signed)
Physical Therapy Evaluation Patient Details Name: Marie Coleman MRN: [id number] DOB: [birth year] Today's Date: 09/24/2011  Problem List:  Patient Active Problem List  Diagnoses  . Hip fracture, right  . OA (osteoarthritis) of knee    Past Medical History:  Past Medical History  Diagnosis Date  . Vertigo   . Arthritis     OA AND PAIN LEFT KNEE AND PAIN IN SHOULDERS, HANDS, LOWER BACK  . Osteoporosis   . Seasonal allergies    Past Surgical History:  Past Surgical History  Procedure Date  . Hip pinning,cannulated 05/15/2011    Procedure: CANNULATED HIP PINNING;  Surgeon: Jacki Cones;  Location: [facility];  Service: Orthopedics;  Laterality: Right;  Cannulated Right Hip Pinning   (c-arm)   . Abdominal hysterectomy 1977  . Surgery for neck spurs 1980's   . Lumbar surgery for hnp 1990's   . Orif left wrist  1990's   . Left rotator cuff repair jan 2012     PT Assessment/Plan/Recommendation PT Assessment Clinical Impression Statement: Pt wtih L TKR presents with decreased L LE strength/ROm and limitaions in functional mobility PT Recommendation/Assessment: Patient will need skilled PT in the acute care venue PT Problem List: Decreased strength;Decreased range of motion;Decreased activity tolerance;Decreased mobility;Decreased knowledge of use of DME;Pain PT Therapy Diagnosis : Difficulty walking PT Plan PT Frequency: 7X/week PT Treatment/Interventions: DME instruction;Gait training;Stair training;Functional mobility training;Therapeutic activities;Therapeutic exercise;Patient/family education PT Recommendation Recommendations for Other Services: OT consult Follow Up Recommendations: Skilled nursing facility Equipment Recommended: Defer to next venue PT Goals  Acute Rehab PT Goals PT Goal Formulation: With patient Time For Goal Achievement: 7 days Pt will go Supine/Side to Sit: with supervision PT Goal: Supine/Side to Sit - Progress: Goal set today Pt will go Sit to  Supine/Side: with supervision PT Goal: Sit to Supine/Side - Progress: Goal set today Pt will go Sit to Stand: with supervision PT Goal: Sit to Stand - Progress: Goal set today Pt will go Stand to Sit: with supervision PT Goal: Stand to Sit - Progress: Goal set today Pt will Ambulate: 51 - 150 feet;with supervision;with rolling walker PT Goal: Ambulate - Progress: Goal set today  PT Evaluation Precautions/Restrictions  Precautions Precautions: Knee Required Braces or Orthoses: Yes Knee Immobilizer: Discontinue once straight leg raise with < 10 degree lag Restrictions Weight Bearing Restrictions: No LLE Weight Bearing: Weight bearing as tolerated Prior Functioning  Home Living Lives With: Spouse Receives Help From: Family Type of Home: House Home Layout: One level Prior Function Level of Independence: Independent with basic ADLs;Independent with gait;Independent with transfers Able to Take Stairs?: Yes Cognition Cognition Arousal/Alertness: Awake/alert Overall Cognitive Status: Appears within functional limits for tasks assessed Orientation Level: Oriented X4 Sensation/Coordination Coordination Gross Motor Movements are Fluid and Coordinated: Yes Extremity Assessment RUE Assessment RUE Assessment: Within Functional Limits LUE Assessment LUE Assessment: Within Functional Limits RLE Assessment RLE Assessment: Within Functional Limits LLE Assessment LLE Assessment: Exceptions to WFL (L knee AAROM -10 - 70; 2+/5 quads) Mobility (including Balance) Bed Mobility Bed Mobility: Yes Supine to Sit: 3: Mod assist Supine to Sit Details (indicate cue type and reason): cues for sequence and self assist with UEs Transfers Transfers: Yes Sit to Stand: 3: Mod assist Sit to Stand Details (indicate cue type and reason): cues for LE managment and use of UEs Stand to Sit: 3: Mod assist Stand to Sit Details: cues for LE managment and use of UEs Ambulation/Gait Ambulation/Gait:  Yes Ambulation/Gait Assistance: 3: Mod assist Ambulation/Gait Assistance Details (  indicate cue type and reason): cues for sequence and position from RW Ambulation Distance (Feet): 3 Feet (ltd by c/o dizziness) Assistive device: Rolling walker Gait Pattern: Step-to pattern    Exercise  Total Joint Exercises Ankle Circles/Pumps: AROM;10 reps;Both;Supine Quad Sets: AROM;10 reps;Both;Supine Heel Slides: AAROM;10 reps;Supine;Left Straight Leg Raises: AAROM;Left;10 reps;Supine End of Session PT - End of Session Equipment Utilized During Treatment: Gait belt;Left knee immobilizer Activity Tolerance: Other (comment) (ltd by c/o dizziness/nausea) Patient left: in chair;with call bell in reach Nurse Communication: Mobility status for transfers;Mobility status for ambulation General Behavior During Session: [facility] for tasks performed Cognition: [facility] for tasks performed  Shallon Yaklin 09/24/2011, 1:01 PM

## 2011-09-25 DIAGNOSIS — D62 Acute posthemorrhagic anemia: Secondary | ICD-10-CM

## 2011-09-25 LAB — BASIC METABOLIC PANEL
BUN: 10 mg/dL (ref 6–23)
CO2: 29 mEq/L (ref 19–32)
Chloride: 102 mEq/L (ref 96–112)
Creatinine, Ser: 0.57 mg/dL (ref 0.50–1.10)
Glucose, Bld: 113 mg/dL — ABNORMAL HIGH (ref 70–99)
Potassium: 3.7 mEq/L (ref 3.5–5.1)

## 2011-09-25 LAB — CBC
HCT: 24.4 % — ABNORMAL LOW (ref 36.0–46.0)
Hemoglobin: 8 g/dL — ABNORMAL LOW (ref 12.0–15.0)
MCV: 90.4 fL (ref 78.0–100.0)
WBC: 7.7 10*3/uL (ref 4.0–10.5)

## 2011-09-25 MED ORDER — ACETAMINOPHEN 10 MG/ML IV SOLN
1000.0000 mg | Freq: Once | INTRAVENOUS | Status: AC
  Administered 2011-09-25: 1000 mg via INTRAVENOUS
  Filled 2011-09-25: qty 100

## 2011-09-25 MED ORDER — POLYSACCHARIDE IRON COMPLEX 150 MG PO CAPS
150.0000 mg | ORAL_CAPSULE | Freq: Two times a day (BID) | ORAL | Status: DC
  Administered 2011-09-25 – 2011-09-26 (×2): 150 mg via ORAL
  Filled 2011-09-25 (×3): qty 1

## 2011-09-25 MED ORDER — SODIUM CHLORIDE 0.9 % IV SOLN
INTRAVENOUS | Status: DC
  Administered 2011-09-25: 10 mL via INTRAVENOUS

## 2011-09-25 NOTE — Progress Notes (Signed)
Assisted patient to bedside commode. While patient was sitting on commode she became lethargic nearly passing out. Staff emergency button activated immediate assistance was available. Patient assisted back to bed with assist x 2. VS as follows BP 109/68 HR-97 R-16 O2 SATs 96% on room air. Patients states she just felt flushed, faint and weak. Cold wash cloth placed on forehead. Notified Avel Peace, PA new orders given will continue to monitor.

## 2011-09-25 NOTE — Progress Notes (Addendum)
Subjective: 2 Days Post-Op Procedure(s) (LRB): TOTAL KNEE ARTHROPLASTY (Left) Patient reports pain as mild.   Patient seen in rounds with Dr. Lequita Halt. Patient doing OK.  Objective: Vital signs in last 24 hours: Temp:  [98.1 F (36.7 C)-100.7 F (38.2 C)] 100.7 F (38.2 C) (04/10 0600) Pulse Rate:  [65-86] 85  (04/10 0600) Resp:  [16] 16  (04/10 0600) BP: (104-123)/(50-70) 104/50 mmHg (04/10 0815) SpO2:  [92 %-100 %] 97 % (04/10 0600)  Intake/Output from previous day:  Intake/Output Summary (Last 24 hours) at 09/25/11 1047 Last data filed at 09/25/11 0600  Gross per 24 hour  Intake   1214 ml  Output   1850 ml  Net   -636 ml    Intake/Output this shift:    Labs:  Basename 09/25/11 0423 09/24/11 0422  HGB 8.0* 9.7*    Basename 09/25/11 0423 09/24/11 0422  WBC 7.7 8.9  RBC 2.70* 3.31*  HCT 24.4* 29.9*  PLT 194 208    Basename 09/25/11 0423 09/24/11 0422  NA 136 134*  K 3.7 4.0  CL 102 101  CO2 29 28  BUN 10 16  CREATININE 0.57 0.53  GLUCOSE 113* 140*  CALCIUM 8.1* 8.3*   No results found for this basename: LABPT:2,INR:2 in the last 72 hours  Exam - Neurovascular intact Sensation intact distally Dressing/Incision - clean, dry, no drainage Motor function intact - moving foot and toes well on exam.   Past Medical History  Diagnosis Date  . Vertigo   . Arthritis     OA AND PAIN LEFT KNEE AND PAIN IN SHOULDERS, HANDS, LOWER BACK  . Osteoporosis   . Seasonal allergies     Assessment/Plan: 2 Days Post-Op Procedure(s) (LRB): TOTAL KNEE ARTHROPLASTY (Left) Principal Problem:  *OA (osteoarthritis) of knee Active Problems:  Postop Acute blood loss anemia   Up with therapy Plan for discharge tomorrow Discharge to SNF  DVT Prophylaxis - Xarelto Weight-Bearing as tolerated to left leg   Addendum Note Patient became orthostatic when up with therapy this morning.  Feeling weak with her activity and therapy.  Will give blood today and recheck labs in  the morning.  Tong Pieczynski 09/25/2011, 10:47 AM

## 2011-09-25 NOTE — Progress Notes (Signed)
Physical Therapy Treatment Patient Details Name: Marie Coleman MRN: [id number] DOB: Jan 17, 1941 Today's Date: 09/25/2011  PT Assessment/Plan  PT - Assessment/Plan Comments on Treatment Session: OOB deferred pending blood transfusion - RN reports pt passing out this am while on [facility] PT Plan: Discharge plan remains appropriate PT Frequency: 7X/week Recommendations for Other Services: OT consult Follow Up Recommendations: Skilled nursing facility Equipment Recommended: Defer to next venue PT Goals  Acute Rehab PT Goals PT Goal Formulation: With patient Time For Goal Achievement: 7 days  PT Treatment Precautions/Restrictions  Precautions Precautions: Knee Required Braces or Orthoses: Yes Knee Immobilizer: Discontinue once straight leg raise with < 10 degree lag Restrictions Weight Bearing Restrictions: No LLE Weight Bearing: Weight bearing as tolerated Mobility (including Balance)      Exercise  Total Joint Exercises Ankle Circles/Pumps: AROM;20 reps;Both;Supine Quad Sets: AROM;20 reps;Supine;Both Heel Slides: AAROM;20 reps;Both;Supine Straight Leg Raises: AAROM;20 reps;Left;Supine End of Session PT - End of Session Activity Tolerance: Treatment limited secondary to medical complications (Comment) Patient left: in bed;with call bell in reach General Behavior During Session: [facility] for tasks performed Cognition: [facility] for tasks performed  Shivangi Lutz 09/25/2011, 2:35 PM

## 2011-09-25 NOTE — Progress Notes (Signed)
Occupational Therapy Note Chart reviewed. See discharge plan is for SNF. Will defer OT eval to SNF. Judithann Sauger OTR/L [phone] 09/25/2011

## 2011-09-26 DIAGNOSIS — I10 Essential (primary) hypertension: Secondary | ICD-10-CM | POA: Diagnosis not present

## 2011-09-26 DIAGNOSIS — S99929A Unspecified injury of unspecified foot, initial encounter: Secondary | ICD-10-CM | POA: Diagnosis not present

## 2011-09-26 DIAGNOSIS — M25569 Pain in unspecified knee: Secondary | ICD-10-CM | POA: Diagnosis not present

## 2011-09-26 DIAGNOSIS — R42 Dizziness and giddiness: Secondary | ICD-10-CM | POA: Diagnosis not present

## 2011-09-26 DIAGNOSIS — Z96659 Presence of unspecified artificial knee joint: Secondary | ICD-10-CM | POA: Diagnosis not present

## 2011-09-26 DIAGNOSIS — Z5189 Encounter for other specified aftercare: Secondary | ICD-10-CM | POA: Diagnosis not present

## 2011-09-26 DIAGNOSIS — M1990 Unspecified osteoarthritis, unspecified site: Secondary | ICD-10-CM | POA: Diagnosis not present

## 2011-09-26 DIAGNOSIS — S8990XA Unspecified injury of unspecified lower leg, initial encounter: Secondary | ICD-10-CM | POA: Diagnosis not present

## 2011-09-26 DIAGNOSIS — IMO0002 Reserved for concepts with insufficient information to code with codable children: Secondary | ICD-10-CM | POA: Diagnosis not present

## 2011-09-26 DIAGNOSIS — Z9289 Personal history of other medical treatment: Secondary | ICD-10-CM

## 2011-09-26 DIAGNOSIS — M1710 Unilateral primary osteoarthritis, unspecified knee: Secondary | ICD-10-CM | POA: Diagnosis not present

## 2011-09-26 DIAGNOSIS — D62 Acute posthemorrhagic anemia: Secondary | ICD-10-CM | POA: Diagnosis not present

## 2011-09-26 DIAGNOSIS — E871 Hypo-osmolality and hyponatremia: Secondary | ICD-10-CM

## 2011-09-26 DIAGNOSIS — D649 Anemia, unspecified: Secondary | ICD-10-CM | POA: Diagnosis not present

## 2011-09-26 DIAGNOSIS — K5900 Constipation, unspecified: Secondary | ICD-10-CM | POA: Diagnosis not present

## 2011-09-26 DIAGNOSIS — M810 Age-related osteoporosis without current pathological fracture: Secondary | ICD-10-CM | POA: Diagnosis not present

## 2011-09-26 DIAGNOSIS — D531 Other megaloblastic anemias, not elsewhere classified: Secondary | ICD-10-CM | POA: Diagnosis not present

## 2011-09-26 DIAGNOSIS — Z79899 Other long term (current) drug therapy: Secondary | ICD-10-CM | POA: Diagnosis not present

## 2011-09-26 DIAGNOSIS — J301 Allergic rhinitis due to pollen: Secondary | ICD-10-CM | POA: Diagnosis not present

## 2011-09-26 LAB — TYPE AND SCREEN
ABO/RH(D): O POS
Antibody Screen: NEGATIVE
Unit division: 0

## 2011-09-26 LAB — CBC
HCT: 29.8 % — ABNORMAL LOW (ref 36.0–46.0)
Hemoglobin: 9.9 g/dL — ABNORMAL LOW (ref 12.0–15.0)
MCH: 29.6 pg (ref 26.0–34.0)
MCV: 89.2 fL (ref 78.0–100.0)
RBC: 3.34 MIL/uL — ABNORMAL LOW (ref 3.87–5.11)

## 2011-09-26 LAB — BASIC METABOLIC PANEL
BUN: 10 mg/dL (ref 6–23)
CO2: 27 mEq/L (ref 19–32)
Calcium: 8.1 mg/dL — ABNORMAL LOW (ref 8.4–10.5)
Creatinine, Ser: 0.52 mg/dL (ref 0.50–1.10)
Glucose, Bld: 110 mg/dL — ABNORMAL HIGH (ref 70–99)
Sodium: 135 mEq/L (ref 135–145)

## 2011-09-26 MED ORDER — ALUM & MAG HYDROXIDE-SIMETH 200-200-20 MG/5ML PO SUSP: 30.0000 mL | ORAL | Status: AC | PRN

## 2011-09-26 MED ORDER — TRAMADOL HCL 50 MG PO TABS: 50.0000 mg | ORAL_TABLET | Freq: Four times a day (QID) | ORAL | Status: AC | PRN

## 2011-09-26 MED ORDER — METHOCARBAMOL 500 MG PO TABS: 500.0000 mg | ORAL_TABLET | Freq: Four times a day (QID) | ORAL | Status: AC | PRN

## 2011-09-26 MED ORDER — DSS 100 MG PO CAPS: 100.0000 mg | ORAL_CAPSULE | Freq: Two times a day (BID) | ORAL | Status: AC

## 2011-09-26 MED ORDER — POLYETHYLENE GLYCOL 3350 17 G PO PACK: 17.0000 g | PACK | Freq: Every day | ORAL | Status: AC | PRN

## 2011-09-26 MED ORDER — ONDANSETRON HCL 4 MG PO TABS: 4.0000 mg | ORAL_TABLET | Freq: Four times a day (QID) | ORAL | Status: AC | PRN

## 2011-09-26 MED ORDER — OXYCODONE HCL 5 MG PO TABS: 5.0000 mg | ORAL_TABLET | ORAL | Status: AC | PRN

## 2011-09-26 MED ORDER — POLYSACCHARIDE IRON COMPLEX 150 MG PO CAPS: 150.0000 mg | ORAL_CAPSULE | Freq: Every day | ORAL | Status: DC

## 2011-09-26 MED ORDER — RIVAROXABAN 10 MG PO TABS: 10.0000 mg | ORAL_TABLET | Freq: Every day | ORAL | Status: DC

## 2011-09-26 MED ORDER — ACETAMINOPHEN 325 MG PO TABS: 650.0000 mg | ORAL_TABLET | Freq: Four times a day (QID) | ORAL | Status: AC | PRN

## 2011-09-26 MED ORDER — BISACODYL 10 MG RE SUPP: 10.0000 mg | Freq: Every day | RECTAL | Status: AC | PRN

## 2011-09-26 NOTE — Discharge Instructions (Signed)
Knee Rehabilitation, Guidelines Following Surgery Results after knee surgery are often greatly improved when you follow the exercise, range of motion and muscle strengthening exercises prescribed by your doctor. Safety measures are also important to protect the knee from further injury. Any time any of these exercises cause you to have increased pain or swelling in your knee joint, decrease the amount until you are comfortable again and slowly increase them. If you have problems or questions, call your caregiver or physical therapist for advice. HOME CARE INSTRUCTIONS   Remove items at home which could result in a fall. This includes throw rugs or furniture in walking pathways.   Continue medications as instructed.   You may shower or take tub baths when your staples or stitches are removed or as instructed.   Walk using crutches or walker as instructed.   Put weight on your legs and walk as much as is comfortable.   You may resume a sexual relationship in one month or when given the OK by your doctor.   Return to work as instructed by your doctor.   Do not drive a car for 6 weeks or as instructed.   Wear elastic stockings until instructed not to.   Make sure you keep all of your appointments after your operation with all of your doctors and caregivers.  RANGE OF MOTION AND STRENGTHENING EXERCISES Rehabilitation of the knee is important following a knee injury or an operation. After just a few days of immobilization, the muscles of the thigh which control the knee become weakened and shrink (atrophy). Knee exercises are designed to build up the tone and strength of the thigh muscles and to improve knee motion. Often times heat used for twenty to thirty minutes before working out will loosen up your tissues and help with improving the range of motion. These exercises can be done on a training (exercise) mat, on the floor, on a table or on a bed. Use what ever works the best and is most  comfortable for you Knee exercises include:  Leg Lifts - While your knee is still immobilized in a splint or cast, you can do straight leg raises. Lift the leg to 60 degrees, hold for 3 sec, and slowly lower the leg. Repeat 10-20 times 2-3 times daily. Perform this exercise against resistance later as your knee gets better.   Quad and Hamstring Sets - Tighten up the muscle on the front of the thigh (Quad) and hold for 5-10 sec. Repeat this 10-20 times hourly. Hamstring sets are done by pushing the foot backward against an object and holding for 5-10 sec. Repeat as with quad sets.  A rehabilitation program following serious knee injuries can speed recovery and prevent re-injury in the future due to weakened muscles. Contact your doctor or a physical therapist for more information on knee rehabilitation. MAKE SURE YOU:   Understand these instructions.   Will watch your condition.   Will get help right away if you are not doing well or get worse.  Document Released: 06/03/2005 Document Revised: 05/23/2011 Document Reviewed: 11/21/2006 [employer] Patient Information 2012 [employer].  Pick up stool softner and laxative for home. Do not submerge incision under water. May shower. Continue to use ice for pain and swelling from surgery.  

## 2011-09-26 NOTE — Progress Notes (Signed)
Physical Therapy Treatment Patient Details Name: Marie Coleman MRN: [id number] DOB: 06/30/1940 Today's Date: 09/26/2011  PT Assessment/Plan  PT - Assessment/Plan PT Plan: Discharge plan remains appropriate PT Frequency: 7X/week Recommendations for Other Services: OT consult Follow Up Recommendations: Skilled nursing facility Equipment Recommended: Defer to next venue PT Goals  Acute Rehab PT Goals PT Goal Formulation: With patient Time For Goal Achievement: 7 days Pt will go Supine/Side to Sit: with supervision PT Goal: Supine/Side to Sit - Progress: Progressing toward goal Pt will go Sit to Supine/Side: with supervision PT Goal: Sit to Supine/Side - Progress: Progressing toward goal Pt will go Sit to Stand: with supervision PT Goal: Sit to Stand - Progress: Progressing toward goal Pt will go Stand to Sit: with supervision PT Goal: Stand to Sit - Progress: Progressing toward goal Pt will Ambulate: 51 - 150 feet;with supervision;with rolling walker PT Goal: Ambulate - Progress: Progressing toward goal  PT Treatment Precautions/Restrictions  Precautions Precautions: Knee Required Braces or Orthoses DO NOT USE: Yes Knee Immobilizer DO NOT USE: Discontinue once straight leg raise with < 10 degree lag Restrictions Weight Bearing Restrictions: Yes LLE Weight Bearing: Weight bearing as tolerated Other Position/Activity Restrictions: Pt performed IND SLR - KI not used for ambulation Mobility (including Balance) Bed Mobility Supine to Sit: 4: Min assist Supine to Sit Details (indicate cue type and reason): min assist with L LE Sit to Supine: 4: Min assist Sit to Supine - Details (indicate cue type and reason): min assist with L LE Transfers Sit to Stand: 5: Supervision;4: Min assist;With upper extremity assist;From bed Sit to Stand Details (indicate cue type and reason): min cues for use of UEs Stand to Sit: 5: Supervision;4: Min assist Stand to Sit Details: min cues for use of  UEs Ambulation/Gait Ambulation/Gait Assistance: 5: Supervision;4: Min assist Ambulation/Gait Assistance Details (indicate cue type and reason): min cues for posture and position from RW Ambulation Distance (Feet): 200 Feet Assistive device: Rolling walker Gait Pattern: Step-to pattern    Exercise  Total Joint Exercises Ankle Circles/Pumps: AROM;20 reps;Both;Supine Quad Sets: AROM;20 reps;Supine;Both Heel Slides: AAROM;20 reps;Both;Supine Straight Leg Raises: AAROM;20 reps;Left;Supine;AROM End of Session PT - End of Session Activity Tolerance: Patient tolerated treatment well Patient left: in bed;with call bell in reach Nurse Communication: Mobility status for transfers;Mobility status for ambulation General Behavior During Session: [facility] for tasks performed Cognition: [facility] for tasks performed  Marie Coleman 09/26/2011, 2:55 PM

## 2011-09-26 NOTE — Discharge Summary (Signed)
Physician Discharge Summary   Patient ID: Marie Coleman MRN: [id number] DOB/AGE: 71-Feb-1942 71 y.o.  Admit date: 09/23/2011 Discharge date: 09/26/2011  Primary Diagnosis:Osteoarthritis Left Knee  Admission Diagnoses: Past Medical History  Diagnosis Date  . Vertigo   . Arthritis     OA AND PAIN LEFT KNEE AND PAIN IN SHOULDERS, HANDS, LOWER BACK  . Osteoporosis   . Seasonal allergies     Discharge Diagnoses:  Principal Problem:  *OA (osteoarthritis) of knee Active Problems:  Postop Acute blood loss anemia  Postop Transfusion   Postop Hyponatremia  Procedure: Procedure(s) (LRB): TOTAL KNEE ARTHROPLASTY (Left)   Consults: None  HPI: Marie Coleman is a 71 y.o. year old female with end stage OA of her left knee with progressively worsening pain and dysfunction. She has constant pain, with activity and at rest and significant functional deficits with difficulties even with ADLs. She has had extensive non-op management including analgesics, injections of cortisone and viscosupplements, and home exercise program, but remains in significant pain with significant dysfunction. Radiographs show bone on bone arthritis medial and lateral with tibial subchondral sclerosis. She presents now for left Total Knee Arthroplasty.   Laboratory Data: Hospital Outpatient Visit on 09/17/2011  Component Date Value Range Status  . MRSA, PCR  09/17/2011 NEGATIVE  NEGATIVE Final  . Staphylococcus aureus  09/17/2011 NEGATIVE  NEGATIVE Final   Comment:                                 The Xpert SA Assay (FDA                          approved for NASAL specimens                          only), is one component of                          a comprehensive surveillance                          program.  It is not intended                          to diagnose infection nor to                          guide or monitor treatment.  Marland Kitchen aPTT (seconds) 09/17/2011 33  24-37 Final  . WBC (K/uL) 09/17/2011 5.2  4.0-10.5  Final  . RBC (MIL/uL) 09/17/2011 4.72  3.87-5.11 Final  . Hemoglobin (g/dL) 08/65/7846 96.2  95.2-84.1 Final  . HCT (%) 09/17/2011 42.8  36.0-46.0 Final  . MCV (fL) 09/17/2011 90.7  78.0-100.0 Final  . MCH (pg) 09/17/2011 28.6  26.0-34.0 Final  . MCHC (g/dL) 32/44/0102 72.5  36.6-44.0 Final  . RDW (%) 09/17/2011 12.9  11.5-15.5 Final  . Platelets (K/uL) 09/17/2011 274  150-400 Final  . Sodium (mEq/L) 09/17/2011 136  135-145 Final  . Potassium (mEq/L) 09/17/2011 4.3  3.5-5.1 Final  . Chloride (mEq/L) 09/17/2011 99  96-112 Final  . CO2 (mEq/L) 09/17/2011 29  19-32 Final  . Glucose, Bld (mg/dL) 34/74/2595 83  63-87 Final  . BUN (mg/dL) 56/43/3295 23  1-88 Final  . Creatinine,  Ser (mg/dL) 16/03/9603 5.40  9.81-1.91 Final  . Calcium (mg/dL) 47/82/9562 9.9  1.3-08.6 Final  . Total Protein (g/dL) 57/84/6962 7.9  9.5-2.8 Final  . Albumin (g/dL) 41/32/4401 4.1  0.2-7.2 Final  . AST (U/L) 09/17/2011 20  0-37 Final  . ALT (U/L) 09/17/2011 9  0-35 Final  . Alkaline Phosphatase (U/L) 09/17/2011 56  39-117 Final  . Total Bilirubin (mg/dL) 53/66/4403 0.4  4.7-4.2 Final  . GFR calc non Af Amer (mL/min) 09/17/2011 >90  >90 Final  . GFR calc Af Amer (mL/min) 09/17/2011 >90  >90 Final   Comment:                                 The eGFR has been calculated                          using the CKD EPI equation.                          This calculation has not been                          validated in all clinical                          situations.                          eGFR's persistently                          <90 mL/min signify                          possible Chronic Kidney Disease.  Marland Kitchen Prothrombin Time (seconds) 09/17/2011 12.8  11.6-15.2 Final  . INR  09/17/2011 0.94  0.00-1.49 Final  . Color, Urine  09/17/2011 YELLOW  YELLOW Final  . APPearance  09/17/2011 CLEAR  CLEAR Final  . Specific Gravity, Urine  09/17/2011 1.017  1.005-1.030 Final  . pH  09/17/2011 5.0  5.0-8.0 Final  .  Glucose, UA (mg/dL) 59/56/3875 NEGATIVE  NEGATIVE Final  . Hgb urine dipstick  09/17/2011 NEGATIVE  NEGATIVE Final  . Bilirubin Urine  09/17/2011 NEGATIVE  NEGATIVE Final  . Ketones, ur (mg/dL) 64/33/2951 NEGATIVE  NEGATIVE Final  . Protein, ur (mg/dL) 88/41/6606 NEGATIVE  NEGATIVE Final  . Urobilinogen, UA (mg/dL) 30/16/0109 0.2  3.2-3.5 Final  . Nitrite  09/17/2011 NEGATIVE  NEGATIVE Final  . Leukocytes, UA  09/17/2011 SMALL* NEGATIVE Final  . WBC, UA (WBC/hpf) 09/17/2011 3-6  <3 Final  . Bacteria, UA  09/17/2011 FEW* RARE Final    Basename 09/26/11 0416 09/25/11 0423 09/24/11 0422  HGB 9.9* 8.0* 9.7*    Basename 09/26/11 0416 09/25/11 0423  WBC 8.0 7.7  RBC 3.34* 2.70*  HCT 29.8* 24.4*  PLT 193 194    Basename 09/26/11 0416 09/25/11 0423  NA 135 136  K 3.7 3.7  CL 102 102  CO2 27 29  BUN 10 10  CREATININE 0.52 0.57  GLUCOSE 110* 113*  CALCIUM 8.1* 8.1*   No results found for this basename: LABPT:2,INR:2 in the last 72 hours  X-Rays:No results found.  EKG: Orders placed during the hospital encounter of  05/14/11  . EKG     Hospital Course: Patient was admitted to [facility] and taken to the OR and underwent the above state procedure without complications.  Patient tolerated the procedure well and was later transferred to the recovery room and then to the orthopaedic floor for postoperative care.  They were given PO and IV analgesics for pain control following their surgery.  They were given 24 hours of postoperative antibiotics and started on DVT prophylaxis in the form of Xarelto.   PT and OT were ordered for total joint protocol.  Discharge planning consulted to help with postop disposition and equipment needs.  Patient had a tough night on the evening of surgery due to nausea but started to get up OOB with therapy on day one.  PCA Morphine was discontinued and they were weaned over to PO meds.  Hemovac drain was pulled without difficulty.  Continued to work  with therapy into day two but developed hypotension due to her low HGB. She was given two units of blood which made her feel better. Dressing was changed on day two and the incision was healing well.  By day three, the patient felt much better after the blood and had progressed with therapy and meeting their goals.  Incision was healing well.  Patient was seen in rounds and was ready to go home.  Discharge Medications: Prior to Admission medications   Medication Sig Start Date End Date Taking? Authorizing Provider  sodium chloride (OCEAN) 0.65 % nasal spray Place 1 spray into the nose as needed. Allergies    Yes Historical Provider, MD  acetaminophen (TYLENOL) 325 MG tablet Take 2 tablets (650 mg total) by mouth every 6 (six) hours as needed (or Fever >/= 101). 09/26/11 09/25/12  Junaid Wurzer, PA  alum & mag hydroxide-simeth (MAALOX/MYLANTA) 200-200-20 MG/5ML suspension Take 30 mLs by mouth every 4 (four) hours as needed. 09/26/11 10/06/11  Geordan Xu, PA  bisacodyl (DULCOLAX) 10 MG suppository Place 1 suppository (10 mg total) rectally daily as needed. 09/26/11 10/06/11  Hadassa Cermak, PA  docusate sodium 100 MG CAPS Take 100 mg by mouth 2 (two) times daily. 09/26/11 10/06/11  Vanilla Heatherington, PA  fexofenadine (ALLEGRA) 180 MG tablet Take 180 mg by mouth daily as needed. For allergies    Historical Provider, MD  iron polysaccharides (NIFEREX) 150 MG capsule Take 1 capsule (150 mg total) by mouth daily. 09/26/11 09/25/12  Crysten Kaman, PA  methocarbamol (ROBAXIN) 500 MG tablet Take 1 tablet (500 mg total) by mouth every 6 (six) hours as needed. 09/26/11 10/06/11  Kathelene Rumberger, PA  ondansetron (ZOFRAN) 4 MG tablet Take 1 tablet (4 mg total) by mouth every 6 (six) hours as needed for nausea. 09/26/11 10/03/11  Rhea Kaelin, PA  oxyCODONE (OXY IR/ROXICODONE) 5 MG immediate release tablet Take 1-2 tablets (5-10 mg total) by mouth every 3 (three) hours as needed. 09/26/11  10/06/11  Arleen Bar, PA  polyethylene glycol (MIRALAX / GLYCOLAX) packet Take 17 g by mouth daily as needed. 09/26/11 09/29/11  Yaa Donnellan Julien Girt, PA  rivaroxaban (XARELTO) 10 MG TABS tablet Take 1 tablet (10 mg total) by mouth daily with breakfast. Take for two and a half more weeks and then discontinue.  May resume 81 mg Aspirin daily after completing the Xarelto. 09/26/11   Jencarlo Bonadonna Julien Girt, PA  traMADol (ULTRAM) 50 MG tablet Take 1-2 tablets (50-100 mg total) by mouth every 6 (six) hours as needed. 09/26/11 10/06/11  Jarryd Gratz Julien Girt, PA    Diet:  regular Activity:WBAT Follow-up:in 2 weeks Disposition: [facility] Discharged Condition: good   Discharge Orders    Future Orders Please Complete By Expires   Diet - low sodium heart healthy      Call MD / Call 911      Comments:   If you experience chest pain or shortness of breath, CALL 911 and be transported to the hospital emergency room.  If you develope a fever above 101 F, pus (white drainage) or increased drainage or redness at the wound, or calf pain, call your surgeon's office.   Constipation Prevention      Comments:   Drink plenty of fluids.  Prune juice may be helpful.  You may use a stool softener, such as Colace (over the counter) 100 mg twice a day.  Use MiraLax (over the counter) for constipation as needed.   Increase activity slowly as tolerated      Weight Bearing as taught in Physical Therapy      Comments:   Use a walker or crutches as instructed.   Discharge instructions      Comments:   Pick up stool softner and laxative for home. Do not submerge incision under water. May shower. Continue to use ice for pain and swelling from surgery.    Driving restrictions      Comments:   No driving   Lifting restrictions      Comments:   No lifting   TED hose      Comments:   Use stockings (TED hose) for 3 weeks on both leg(s).  You may remove them at night for sleeping.   Change dressing      Comments:     Change dressing daily with sterile 4 x 4 inch gauze dressing and apply TED hose.  You may clean the incision with alcohol prior to redressing.   Do not put a pillow under the knee. Place it under the heel.        Medication List  As of 09/26/2011  9:50 AM   STOP taking these medications         aspirin 81 MG chewable tablet      calcium carbonate 500 MG chewable tablet      CALCIUM CITRATE + D PO      cholecalciferol 1000 UNITS tablet      estradiol 0.075 MG/24HR      OVER THE COUNTER MEDICATION      SYSTANE ULTRA 0.4-0.3 % Soln         TAKE these medications         acetaminophen 325 MG tablet   Commonly known as: TYLENOL   Take 2 tablets (650 mg total) by mouth every 6 (six) hours as needed (or Fever >/= 101).      alum & mag hydroxide-simeth 200-200-20 MG/5ML suspension   Commonly known as: MAALOX/MYLANTA   Take 30 mLs by mouth every 4 (four) hours as needed.      bisacodyl 10 MG suppository   Commonly known as: DULCOLAX   Place 1 suppository (10 mg total) rectally daily as needed.      DSS 100 MG Caps   Take 100 mg by mouth 2 (two) times daily.      fexofenadine 180 MG tablet   Commonly known as: ALLEGRA   Take 180 mg by mouth daily as needed. For allergies      iron polysaccharides 150 MG capsule   Commonly known as: NIFEREX   Take 1 capsule (  150 mg total) by mouth daily.      methocarbamol 500 MG tablet   Commonly known as: ROBAXIN   Take 1 tablet (500 mg total) by mouth every 6 (six) hours as needed.      ondansetron 4 MG tablet   Commonly known as: ZOFRAN   Take 1 tablet (4 mg total) by mouth every 6 (six) hours as needed for nausea.      oxyCODONE 5 MG immediate release tablet   Commonly known as: Oxy IR/ROXICODONE   Take 1-2 tablets (5-10 mg total) by mouth every 3 (three) hours as needed.      polyethylene glycol packet   Commonly known as: MIRALAX / GLYCOLAX   Take 17 g by mouth daily as needed.      rivaroxaban 10 MG Tabs tablet    Commonly known as: XARELTO   Take 1 tablet (10 mg total) by mouth daily with breakfast.      sodium chloride 0.65 % nasal spray   Commonly known as: OCEAN   Place 1 spray into the nose as needed. Allergies        traMADol 50 MG tablet   Commonly known as: ULTRAM   Take 1-2 tablets (50-100 mg total) by mouth every 6 (six) hours as needed.           Follow-up Information    Follow up with Loanne Drilling, MD. Schedule an appointment as soon as possible for a visit in 2 weeks.   Contact information:   [facility], [place] [address] [place] [address] [phone]          Signed: Patrica Duel 09/26/2011, 9:50 AM

## 2011-09-26 NOTE — Progress Notes (Signed)
Pt d/c to [facility] today via [facility] transport for [facility] placement.  Cori Razor  LCSW  [phone]

## 2011-09-26 NOTE — Plan of Care (Signed)
Problem: Consults Goal: Diagnosis- Total Joint Replacement Primary Total Knee LEFT     

## 2011-10-02 DIAGNOSIS — K5900 Constipation, unspecified: Secondary | ICD-10-CM | POA: Diagnosis not present

## 2011-10-02 DIAGNOSIS — Z79899 Other long term (current) drug therapy: Secondary | ICD-10-CM | POA: Diagnosis not present

## 2011-10-02 DIAGNOSIS — M1710 Unilateral primary osteoarthritis, unspecified knee: Secondary | ICD-10-CM | POA: Diagnosis not present

## 2011-10-02 DIAGNOSIS — D62 Acute posthemorrhagic anemia: Secondary | ICD-10-CM | POA: Diagnosis not present

## 2011-10-08 ENCOUNTER — Encounter (HOSPITAL_COMMUNITY): Payer: Self-pay | Admitting: Orthopedic Surgery

## 2011-10-09 DIAGNOSIS — M1990 Unspecified osteoarthritis, unspecified site: Secondary | ICD-10-CM | POA: Diagnosis not present

## 2011-10-09 DIAGNOSIS — D62 Acute posthemorrhagic anemia: Secondary | ICD-10-CM | POA: Diagnosis not present

## 2011-10-09 DIAGNOSIS — J301 Allergic rhinitis due to pollen: Secondary | ICD-10-CM | POA: Diagnosis not present

## 2011-10-14 ENCOUNTER — Ambulatory Visit: Payer: Medicare Other | Attending: Orthopedic Surgery | Admitting: Physical Therapy

## 2011-10-14 DIAGNOSIS — Z96659 Presence of unspecified artificial knee joint: Secondary | ICD-10-CM | POA: Insufficient documentation

## 2011-10-14 DIAGNOSIS — IMO0001 Reserved for inherently not codable concepts without codable children: Secondary | ICD-10-CM | POA: Insufficient documentation

## 2011-10-14 DIAGNOSIS — M25669 Stiffness of unspecified knee, not elsewhere classified: Secondary | ICD-10-CM | POA: Insufficient documentation

## 2011-10-14 DIAGNOSIS — M25569 Pain in unspecified knee: Secondary | ICD-10-CM | POA: Diagnosis not present

## 2011-10-16 ENCOUNTER — Ambulatory Visit: Payer: Medicare Other | Attending: Orthopedic Surgery | Admitting: Physical Therapy

## 2011-10-16 DIAGNOSIS — M25669 Stiffness of unspecified knee, not elsewhere classified: Secondary | ICD-10-CM | POA: Diagnosis not present

## 2011-10-16 DIAGNOSIS — M25569 Pain in unspecified knee: Secondary | ICD-10-CM | POA: Insufficient documentation

## 2011-10-16 DIAGNOSIS — IMO0001 Reserved for inherently not codable concepts without codable children: Secondary | ICD-10-CM | POA: Diagnosis not present

## 2011-10-16 DIAGNOSIS — Z96659 Presence of unspecified artificial knee joint: Secondary | ICD-10-CM | POA: Insufficient documentation

## 2011-10-18 ENCOUNTER — Ambulatory Visit: Payer: Medicare Other | Admitting: Physical Therapy

## 2011-10-21 ENCOUNTER — Ambulatory Visit: Payer: Medicare Other | Admitting: Physical Therapy

## 2011-10-21 DIAGNOSIS — S72009A Fracture of unspecified part of neck of unspecified femur, initial encounter for closed fracture: Secondary | ICD-10-CM | POA: Diagnosis not present

## 2011-10-23 ENCOUNTER — Ambulatory Visit: Payer: Medicare Other | Admitting: Physical Therapy

## 2011-10-25 ENCOUNTER — Ambulatory Visit: Payer: Medicare Other | Admitting: Physical Therapy

## 2011-10-28 ENCOUNTER — Ambulatory Visit: Payer: Medicare Other | Admitting: Physical Therapy

## 2011-10-29 ENCOUNTER — Ambulatory Visit: Payer: Medicare Other | Admitting: Physical Therapy

## 2011-10-30 ENCOUNTER — Ambulatory Visit: Payer: Medicare Other | Admitting: Physical Therapy

## 2011-11-01 ENCOUNTER — Ambulatory Visit: Payer: Medicare Other | Admitting: Physical Therapy

## 2011-11-04 ENCOUNTER — Ambulatory Visit: Payer: Medicare Other | Admitting: Physical Therapy

## 2011-11-05 DIAGNOSIS — S72009A Fracture of unspecified part of neck of unspecified femur, initial encounter for closed fracture: Secondary | ICD-10-CM | POA: Diagnosis not present

## 2011-11-08 ENCOUNTER — Ambulatory Visit: Payer: Medicare Other | Admitting: Physical Therapy

## 2011-11-12 ENCOUNTER — Ambulatory Visit: Payer: Medicare Other | Admitting: Physical Therapy

## 2011-11-15 ENCOUNTER — Ambulatory Visit: Payer: Medicare Other | Admitting: Physical Therapy

## 2011-12-16 DIAGNOSIS — S72009A Fracture of unspecified part of neck of unspecified femur, initial encounter for closed fracture: Secondary | ICD-10-CM | POA: Diagnosis not present

## 2011-12-17 DIAGNOSIS — Z1231 Encounter for screening mammogram for malignant neoplasm of breast: Secondary | ICD-10-CM | POA: Diagnosis not present

## 2012-01-14 DIAGNOSIS — M899 Disorder of bone, unspecified: Secondary | ICD-10-CM | POA: Diagnosis not present

## 2012-02-11 DIAGNOSIS — M949 Disorder of cartilage, unspecified: Secondary | ICD-10-CM | POA: Diagnosis not present

## 2012-02-11 DIAGNOSIS — M899 Disorder of bone, unspecified: Secondary | ICD-10-CM | POA: Diagnosis not present

## 2012-02-18 DIAGNOSIS — H2510 Age-related nuclear cataract, unspecified eye: Secondary | ICD-10-CM | POA: Diagnosis not present

## 2012-02-18 DIAGNOSIS — H11159 Pinguecula, unspecified eye: Secondary | ICD-10-CM | POA: Diagnosis not present

## 2012-02-18 DIAGNOSIS — H43399 Other vitreous opacities, unspecified eye: Secondary | ICD-10-CM | POA: Diagnosis not present

## 2012-03-18 DIAGNOSIS — M25559 Pain in unspecified hip: Secondary | ICD-10-CM | POA: Diagnosis not present

## 2012-03-19 ENCOUNTER — Other Ambulatory Visit: Payer: Self-pay | Admitting: Orthopedic Surgery

## 2012-03-19 DIAGNOSIS — M25551 Pain in right hip: Secondary | ICD-10-CM

## 2012-03-20 ENCOUNTER — Ambulatory Visit
Admission: RE | Admit: 2012-03-20 | Discharge: 2012-03-20 | Disposition: A | Discharge status: Home / Self Care | Payer: Medicare Other | Source: Ambulatory Visit | Attending: Orthopedic Surgery | Admitting: Orthopedic Surgery

## 2012-03-20 DIAGNOSIS — M25551 Pain in right hip: Secondary | ICD-10-CM

## 2012-03-20 DIAGNOSIS — M87059 Idiopathic aseptic necrosis of unspecified femur: Secondary | ICD-10-CM | POA: Diagnosis not present

## 2012-03-24 DIAGNOSIS — M87059 Idiopathic aseptic necrosis of unspecified femur: Secondary | ICD-10-CM | POA: Diagnosis not present

## 2012-03-26 DIAGNOSIS — M87059 Idiopathic aseptic necrosis of unspecified femur: Secondary | ICD-10-CM | POA: Diagnosis not present

## 2012-03-28 DIAGNOSIS — Z23 Encounter for immunization: Secondary | ICD-10-CM | POA: Diagnosis not present

## 2012-03-29 ENCOUNTER — Other Ambulatory Visit: Payer: Self-pay | Admitting: Orthopedic Surgery

## 2012-03-29 MED ORDER — BUPIVACAINE LIPOSOME 1.3 % IJ SUSP: 20.0000 mL | Freq: Once | INTRAMUSCULAR | Status: DC

## 2012-03-29 MED ORDER — DEXAMETHASONE SODIUM PHOSPHATE 10 MG/ML IJ SOLN: 10.0000 mg | Freq: Once | INTRAMUSCULAR | Status: DC

## 2012-03-29 NOTE — Progress Notes (Signed)
Preoperative surgical orders have been place into the Epic hospital system for Marie Coleman on 03/29/2012, 10:55 AM  by Patrica Duel for surgery on 04/20/2012.  Preop Total Hip orders including Experel Injecion, IV Tylenol, and IV Decadron as long as there are no contraindications to the above medications. Avel Peace, PA-C

## 2012-04-01 ENCOUNTER — Other Ambulatory Visit: Payer: Self-pay | Admitting: Family Medicine

## 2012-04-01 DIAGNOSIS — S79919A Unspecified injury of unspecified hip, initial encounter: Secondary | ICD-10-CM | POA: Diagnosis not present

## 2012-04-01 DIAGNOSIS — R1900 Intra-abdominal and pelvic swelling, mass and lump, unspecified site: Secondary | ICD-10-CM | POA: Diagnosis not present

## 2012-04-01 DIAGNOSIS — Z01818 Encounter for other preprocedural examination: Secondary | ICD-10-CM | POA: Diagnosis not present

## 2012-04-01 DIAGNOSIS — S79929A Unspecified injury of unspecified thigh, initial encounter: Secondary | ICD-10-CM | POA: Diagnosis not present

## 2012-04-06 ENCOUNTER — Ambulatory Visit
Admission: RE | Admit: 2012-04-06 | Discharge: 2012-04-06 | Disposition: A | Discharge status: Home / Self Care | Payer: Medicare Other | Source: Ambulatory Visit | Attending: Family Medicine | Admitting: Family Medicine

## 2012-04-06 DIAGNOSIS — R1900 Intra-abdominal and pelvic swelling, mass and lump, unspecified site: Secondary | ICD-10-CM | POA: Diagnosis not present

## 2012-04-07 ENCOUNTER — Encounter (HOSPITAL_COMMUNITY): Payer: Self-pay | Admitting: Pharmacy Technician

## 2012-04-13 NOTE — Patient Instructions (Signed)
20 Marie Coleman  04/13/2012   Your procedure is scheduled on:  04/20/12 315pm-445pm  Report to [facility] at 1245 AM.  Call this number if you have problems the morning of surgery: [phone]   Remember:   Do not eat food:After Midnight.  May have clear liquids:until 0900am then npo .  Clear liquids include soda, tea, black coffee, apple or grape juice, broth.  Take these medicines the morning of surgery with A SIP OF WATER:    Do not wear jewelry, make-up or nail polish.  Do not wear lotions, powders, or perfumes.   Do not shave 48 hours prior to surgery.  Do not bring valuables to the hospital.  Contacts, dentures or bridgework may not be worn into surgery.  Leave suitcase in the car. After surgery it may be brought to your room.  For patients admitted to the hospital, checkout time is 11:00 AM the day of discharge.      SEE CHG INSTRUCTION SHEET   Please read over the following fact sheets that you were given: MRSA Information, coughing and deep breathing exercises, leg exercises, Incentive Spirometry Fact sheet , Blood Transfusion Fact Sheet

## 2012-04-14 ENCOUNTER — Other Ambulatory Visit (HOSPITAL_COMMUNITY): Payer: Medicare Other

## 2012-04-14 ENCOUNTER — Encounter (HOSPITAL_COMMUNITY): Payer: Self-pay

## 2012-04-14 ENCOUNTER — Ambulatory Visit (HOSPITAL_COMMUNITY)
Admission: RE | Admit: 2012-04-14 | Discharge: 2012-04-14 | Disposition: A | Discharge status: Home / Self Care | Payer: Medicare Other | Source: Ambulatory Visit | Attending: Orthopedic Surgery | Admitting: Orthopedic Surgery

## 2012-04-14 ENCOUNTER — Encounter (HOSPITAL_COMMUNITY)
Admission: RE | Admit: 2012-04-14 | Discharge: 2012-04-14 | Disposition: A | Discharge status: Home / Self Care | Payer: Medicare Other | Source: Ambulatory Visit | Attending: Orthopedic Surgery | Admitting: Orthopedic Surgery

## 2012-04-14 DIAGNOSIS — Z96649 Presence of unspecified artificial hip joint: Secondary | ICD-10-CM | POA: Diagnosis not present

## 2012-04-14 DIAGNOSIS — Z01818 Encounter for other preprocedural examination: Secondary | ICD-10-CM | POA: Diagnosis not present

## 2012-04-14 DIAGNOSIS — Z01812 Encounter for preprocedural laboratory examination: Secondary | ICD-10-CM | POA: Diagnosis not present

## 2012-04-14 DIAGNOSIS — M87059 Idiopathic aseptic necrosis of unspecified femur: Secondary | ICD-10-CM | POA: Diagnosis not present

## 2012-04-14 LAB — CBC
MCH: 29.2 pg (ref 26.0–34.0)
Platelets: 290 10*3/uL (ref 150–400)
RBC: 4.59 MIL/uL (ref 3.87–5.11)
RDW: 13.5 % (ref 11.5–15.5)
WBC: 4.5 10*3/uL (ref 4.0–10.5)

## 2012-04-14 LAB — PROTIME-INR: INR: 0.96 (ref 0.00–1.49)

## 2012-04-14 LAB — COMPREHENSIVE METABOLIC PANEL
ALT: 7 U/L (ref 0–35)
AST: 18 U/L (ref 0–37)
Albumin: 3.8 g/dL (ref 3.5–5.2)
CO2: 30 mEq/L (ref 19–32)
Calcium: 9.5 mg/dL (ref 8.4–10.5)
Chloride: 99 mEq/L (ref 96–112)
Creatinine, Ser: 0.66 mg/dL (ref 0.50–1.10)
GFR calc non Af Amer: 87 mL/min — ABNORMAL LOW (ref >90)
Sodium: 137 mEq/L (ref 135–145)

## 2012-04-14 LAB — URINALYSIS, ROUTINE W REFLEX MICROSCOPIC
Glucose, UA: NEGATIVE mg/dL
Ketones, ur: NEGATIVE mg/dL
Nitrite: NEGATIVE
Specific Gravity, Urine: 1.019 (ref 1.005–1.030)
pH: 6.5 (ref 5.0–8.0)

## 2012-04-14 LAB — SURGICAL PCR SCREEN
MRSA, PCR: NEGATIVE
Staphylococcus aureus: NEGATIVE

## 2012-04-14 LAB — URINE MICROSCOPIC-ADD ON

## 2012-04-14 NOTE — Progress Notes (Signed)
Urinalysis with micro results faxed to Dr Aluisio via EPIC.   

## 2012-04-15 LAB — URINE CULTURE: Colony Count: 25000

## 2012-04-19 ENCOUNTER — Other Ambulatory Visit: Payer: Self-pay | Admitting: Orthopedic Surgery

## 2012-04-19 NOTE — H&P (Signed)
Marie Coleman  DOB: 03/05/1941 Married / Language: English / Race: White Female  Date of Admission:  04/20/2012  Chief Complaint:  Right Hip Pain  History of Present Illness The patient is a 71 year old female who comes in for a preoperative History and Physical. The patient is scheduled for a right total hip arthroplasty to be performed by Dr. Frank V. Aluisio, MD at [facility] on 04/20/2012. The patient is a 71 year old female who presents with a hip problem. The patient reports right hip problems including pain symptoms that have been present for over 11 month(s).  She had a previous ORIF per Dr Gioffre. The symptoms began following a specific injury. Symptoms reported include hip pain and pain with weightbearing The patient describes the hip problem as sharp. Note for "Hip problem": CT done last week showed AVN of the hip. She had a CT scan showing some fragmentation and collapse of a fairly considerable portion of her femoral head. She had a hip pinning done last year. She recently had a total knee on her left side and she has done great with that. The right hip is limiting what she can and can not do. The pain is occurring at all times. This is affecting her sleep at night. She is unable to do the activities she desires due to the pain. She is ready to proceed with the total hip replacement. They have been treated conservatively in the past for the above stated problem and despite conservative measures, they continue to have progressive pain and severe functional limitations and dysfunction. They have failed non-operative management including home exercise, medications. It is felt that they would benefit from undergoing total joint replacement. Risks and benefits of the procedure have been discussed with the patient and they elect to proceed with surgery. There are no active contraindications to surgery such as ongoing infection or rapidly progressive neurological  disease.   Problem List Osteoarthritis, Knee (715.96) S/P total knee arthroplasty (V43.65) Fracture of femoral neck, right (820.8) Pain, Hip (719.45) Avascular necrosis of hip (733.42)  Allergies SULFA. 10/06/2001 Rash.   Family History Mother. Living. age 92 Father. Deceased, Myocardial infarction. age 68, History of Thrombocytopenia Osteoarthritis. mother and father   Social History Tobacco use. Never smoker. never smoker Alcohol use. current drinker; drinks beer and hard liquor; only occasionally per week Illicit drug use. no Living situation. live with spouse Marital status. married Current work status. working part time Drug/Alcohol Rehab (Previously). no Pain Contract. no Exercise. Exercises daily; does running / walking and individual sport Post-Surgical Plans. Plan is to go to [facility]. Number of flights of stairs before winded. 2-3 Drug/Alcohol Rehab (Currently). no Children. 2 Advance Directives. Healthcare POA Tobacco / smoke exposure. no   Medication History Vivelle-Dot (0.0375MG/24HR Patch Biweekly, Transdermal) Active. Aspirin Adult Low Strength (81MG Tablet Chewable, Oral) Active. Fish Oil Burp-Less (1000MG Capsule, Oral) Active. Allegra (180MG Tablet, Oral) Active. Citracal Calcium+D (600-40-500MG-MG-UNIT Tablet ER 24HR, Oral) Active.   Past Surgical History Rotator Cuff Repair - Left. Date: 2012. Spinal Surgery. Lower Back Surgery Arthroscopy of Knee. left Hip Fracture and Surgery. Right Hysterectomy. complete (non-cancerous) Wrist Surgery. Left Wrist Fracture - ORIF - Dr. Norris Neck Disc Surgery   Medical History Osteoarthritis Osteoporosis Vertigo Measles   Review of Systems General:Not Present- Chills, Fever, Night Sweats, Fatigue, Weight Gain, Weight Loss and Memory Loss. Skin:Not Present- Hives, Itching, Rash, Eczema and Lesions. HEENT:Not Present- Tinnitus, Headache, Double Vision, Visual  Loss, Hearing Loss and   Dentures. Respiratory:Not Present- Shortness of breath with exertion, Shortness of breath at rest, Allergies, Coughing up blood and Chronic Cough. Cardiovascular:Not Present- Chest Pain, Racing/skipping heartbeats, Difficulty Breathing Lying Down, Murmur, Swelling and Palpitations. Gastrointestinal:Not Present- Bloody Stool, Heartburn, Abdominal Pain, Vomiting, Nausea, Constipation, Diarrhea, Difficulty Swallowing, Jaundice and Loss of appetitie. Female Genitourinary:Not Present- Blood in Urine, Urinary frequency, Weak urinary stream, Discharge, Flank Pain, Incontinence, Painful Urination, Urgency, Urinary Retention and Urinating at Night. Musculoskeletal:Not Present- Muscle Weakness, Muscle Pain, Joint Swelling, Joint Pain, Back Pain, Morning Stiffness and Spasms. Neurological:Not Present- Tremor, Dizziness, Blackout spells, Paralysis, Difficulty with balance and Weakness. Psychiatric:Not Present- Insomnia.   Vitals Weight: 126 lb Height: 67 in Body Surface Area: 1.64 m Body Mass Index: 19.73 kg/m Pulse: 68 (Regular) Resp.: 12 (Unlabored) BP: 104/60 (Sitting, Left Arm, Standard)    Physical Exam The physical exam findings are as follows:  Note: Patient is a 71 year old female with continued hip pain.   General Mental Status - Alert, cooperative and good historian. General Appearance- pleasant. Not in acute distress. Orientation- Oriented X3. Build & Nutrition- Well nourished and Well developed.   Head and Neck Head- normocephalic, atraumatic . Neck Global Assessment- supple. no bruit auscultated on the right and no bruit auscultated on the left.   Eye Pupil- Bilateral- Regular and Round. Motion- Bilateral- EOMI.   Chest and Lung Exam Auscultation: Breath sounds:- clear at anterior chest wall and - clear at posterior chest wall. Adventitious sounds:- No Adventitious  sounds.   Cardiovascular Auscultation:Rhythm- Regular rate and rhythm. Heart Sounds- S1 WNL and S2 WNL. Murmurs & Other Heart Sounds:Auscultation of the heart reveals - No Murmurs.   Abdomen Palpation/Percussion:Tenderness- Abdomen is non-tender to palpation. Rigidity (guarding)- Abdomen is soft. Auscultation:Auscultation of the abdomen reveals - Bowel sounds normal.   Female Genitourinary  Not done, not pertinent to present illness  Musculoskeletal She is a very pleasant, well developed female. She is alert and oriented. No apparent distress. The left hip with normal range of motion and no discomfort. The right hip flexion is to about 110, rotation in 20, out 30 and abduction 30 with discomfort.  RADIOGRAPHS: AP of the pelvis, lateral of the hip and she has developed some collapse of the femoral head around the screws. I reviewed her CT scan the other day and it showed that fragmentation.  Assessment & Plan Avascular necrosis of hip (733.42) Impression: Right Hip  Note: Patient is for a right total hip replacement by Dr. Aluisio.  Plan is to go to [facility] and she worked with the therapist Josh last surgery.  PCP - Dr. Rankin - [facility]  Signed electronically by DREW L Aidel Davisson, PA-C 

## 2012-04-20 ENCOUNTER — Inpatient Hospital Stay (HOSPITAL_COMMUNITY): Payer: Medicare Other

## 2012-04-20 ENCOUNTER — Encounter (HOSPITAL_COMMUNITY)
Admission: RE | Disposition: A | Discharge status: Skilled Nursing Facility | Payer: Self-pay | Source: Ambulatory Visit | Attending: Orthopedic Surgery

## 2012-04-20 ENCOUNTER — Inpatient Hospital Stay (HOSPITAL_COMMUNITY)
Admission: RE | Admit: 2012-04-20 | Discharge: 2012-04-23 | DRG: 470 | Disposition: A | Discharge status: Skilled Nursing Facility | Payer: Medicare Other | Source: Ambulatory Visit | Attending: Orthopedic Surgery | Admitting: Orthopedic Surgery

## 2012-04-20 ENCOUNTER — Inpatient Hospital Stay (HOSPITAL_COMMUNITY): Payer: Medicare Other | Admitting: Anesthesiology

## 2012-04-20 ENCOUNTER — Encounter (HOSPITAL_COMMUNITY): Payer: Self-pay | Admitting: Anesthesiology

## 2012-04-20 ENCOUNTER — Encounter (HOSPITAL_COMMUNITY): Payer: Self-pay | Admitting: *Deleted

## 2012-04-20 DIAGNOSIS — M810 Age-related osteoporosis without current pathological fracture: Secondary | ICD-10-CM | POA: Diagnosis not present

## 2012-04-20 DIAGNOSIS — Z9889 Other specified postprocedural states: Secondary | ICD-10-CM | POA: Diagnosis not present

## 2012-04-20 DIAGNOSIS — Z8781 Personal history of (healed) traumatic fracture: Secondary | ICD-10-CM

## 2012-04-20 DIAGNOSIS — Z96649 Presence of unspecified artificial hip joint: Secondary | ICD-10-CM

## 2012-04-20 DIAGNOSIS — M87059 Idiopathic aseptic necrosis of unspecified femur: Principal | ICD-10-CM | POA: Diagnosis present

## 2012-04-20 DIAGNOSIS — Z5189 Encounter for other specified aftercare: Secondary | ICD-10-CM | POA: Diagnosis not present

## 2012-04-20 DIAGNOSIS — E871 Hypo-osmolality and hyponatremia: Secondary | ICD-10-CM | POA: Diagnosis not present

## 2012-04-20 DIAGNOSIS — R42 Dizziness and giddiness: Secondary | ICD-10-CM | POA: Diagnosis not present

## 2012-04-20 DIAGNOSIS — M25559 Pain in unspecified hip: Secondary | ICD-10-CM | POA: Diagnosis not present

## 2012-04-20 DIAGNOSIS — Z96659 Presence of unspecified artificial knee joint: Secondary | ICD-10-CM | POA: Diagnosis not present

## 2012-04-20 DIAGNOSIS — M1990 Unspecified osteoarthritis, unspecified site: Secondary | ICD-10-CM | POA: Diagnosis not present

## 2012-04-20 DIAGNOSIS — D62 Acute posthemorrhagic anemia: Secondary | ICD-10-CM | POA: Diagnosis not present

## 2012-04-20 DIAGNOSIS — M8970 Major osseous defect, unspecified site: Secondary | ICD-10-CM | POA: Diagnosis not present

## 2012-04-20 DIAGNOSIS — Z9289 Personal history of other medical treatment: Secondary | ICD-10-CM

## 2012-04-20 DIAGNOSIS — T84029A Dislocation of unspecified internal joint prosthesis, initial encounter: Secondary | ICD-10-CM | POA: Diagnosis not present

## 2012-04-20 DIAGNOSIS — Z471 Aftercare following joint replacement surgery: Secondary | ICD-10-CM | POA: Diagnosis not present

## 2012-04-20 DIAGNOSIS — K5900 Constipation, unspecified: Secondary | ICD-10-CM | POA: Diagnosis not present

## 2012-04-20 HISTORY — PX: TOTAL HIP REVISION: SHX763

## 2012-04-20 SURGERY — TOTAL HIP REVISION
Anesthesia: General | Site: Hip | Laterality: Right | Wound class: Clean

## 2012-04-20 MED ORDER — CHLORHEXIDINE GLUCONATE 4 % EX LIQD
60.0000 mL | Freq: Once | CUTANEOUS | Status: DC
  Filled 2012-04-20: qty 60

## 2012-04-20 MED ORDER — HETASTARCH-ELECTROLYTES 6 % IV SOLN
INTRAVENOUS | Status: DC | PRN
  Administered 2012-04-20: 15:00:00 via INTRAVENOUS

## 2012-04-20 MED ORDER — EPHEDRINE SULFATE 50 MG/ML IJ SOLN
INTRAMUSCULAR | Status: DC | PRN
  Administered 2012-04-20: 5 mg via INTRAVENOUS
  Administered 2012-04-20 (×2): 10 mg via INTRAVENOUS

## 2012-04-20 MED ORDER — ROCURONIUM BROMIDE 100 MG/10ML IV SOLN
INTRAVENOUS | Status: DC | PRN
  Administered 2012-04-20: 40 mg via INTRAVENOUS

## 2012-04-20 MED ORDER — LORATADINE 10 MG PO TABS
10.0000 mg | ORAL_TABLET | Freq: Every day | ORAL | Status: DC
  Administered 2012-04-21 – 2012-04-23 (×3): 10 mg via ORAL
  Filled 2012-04-20 (×4): qty 1

## 2012-04-20 MED ORDER — METOCLOPRAMIDE HCL 10 MG PO TABS: 5.0000 mg | ORAL_TABLET | Freq: Three times a day (TID) | ORAL | Status: DC | PRN

## 2012-04-20 MED ORDER — GLYCOPYRROLATE 0.2 MG/ML IJ SOLN
INTRAMUSCULAR | Status: DC | PRN
  Administered 2012-04-20: 0.4 mg via INTRAVENOUS

## 2012-04-20 MED ORDER — RIVAROXABAN 10 MG PO TABS
10.0000 mg | ORAL_TABLET | Freq: Every day | ORAL | Status: DC
  Administered 2012-04-21 – 2012-04-23 (×3): 10 mg via ORAL
  Filled 2012-04-20 (×4): qty 1

## 2012-04-20 MED ORDER — CEFAZOLIN SODIUM-DEXTROSE 2-3 GM-% IV SOLR
2.0000 g | INTRAVENOUS | Status: AC
  Administered 2012-04-20: 2 g via INTRAVENOUS

## 2012-04-20 MED ORDER — TRAMADOL HCL 50 MG PO TABS: 50.0000 mg | ORAL_TABLET | Freq: Four times a day (QID) | ORAL | Status: DC | PRN

## 2012-04-20 MED ORDER — CEFAZOLIN SODIUM 1-5 GM-% IV SOLN
1.0000 g | Freq: Four times a day (QID) | INTRAVENOUS | Status: AC
  Administered 2012-04-20 – 2012-04-21 (×2): 1 g via INTRAVENOUS
  Filled 2012-04-20 (×2): qty 50

## 2012-04-20 MED ORDER — PHENOL 1.4 % MT LIQD
1.0000 | OROMUCOSAL | Status: DC | PRN
  Filled 2012-04-20: qty 177

## 2012-04-20 MED ORDER — METHOCARBAMOL 500 MG PO TABS: 500.0000 mg | ORAL_TABLET | Freq: Four times a day (QID) | ORAL | Status: DC | PRN

## 2012-04-20 MED ORDER — DEXTROSE-NACL 5-0.9 % IV SOLN
INTRAVENOUS | Status: DC
  Administered 2012-04-20: 1000 mL via INTRAVENOUS
  Administered 2012-04-21 – 2012-04-22 (×2): via INTRAVENOUS

## 2012-04-20 MED ORDER — HYDROMORPHONE HCL PF 1 MG/ML IJ SOLN
0.2500 mg | INTRAMUSCULAR | Status: DC | PRN
  Administered 2012-04-20: 0.5 mg via INTRAVENOUS

## 2012-04-20 MED ORDER — ONDANSETRON HCL 4 MG PO TABS: 4.0000 mg | ORAL_TABLET | Freq: Four times a day (QID) | ORAL | Status: DC | PRN

## 2012-04-20 MED ORDER — PHENYLEPHRINE HCL 10 MG/ML IJ SOLN
INTRAMUSCULAR | Status: DC | PRN
  Administered 2012-04-20 (×2): 40 ug via INTRAVENOUS
  Administered 2012-04-20: 20 ug via INTRAVENOUS

## 2012-04-20 MED ORDER — METOCLOPRAMIDE HCL 5 MG/ML IJ SOLN: 5.0000 mg | Freq: Three times a day (TID) | INTRAMUSCULAR | Status: DC | PRN

## 2012-04-20 MED ORDER — LACTATED RINGERS IV SOLN
INTRAVENOUS | Status: DC | PRN
  Administered 2012-04-20 (×2): via INTRAVENOUS

## 2012-04-20 MED ORDER — OXYCODONE HCL 5 MG PO TABS
5.0000 mg | ORAL_TABLET | ORAL | Status: DC | PRN
  Filled 2012-04-20: qty 1

## 2012-04-20 MED ORDER — ONDANSETRON HCL 4 MG/2ML IJ SOLN: 4.0000 mg | Freq: Four times a day (QID) | INTRAMUSCULAR | Status: DC | PRN

## 2012-04-20 MED ORDER — ACETAMINOPHEN 10 MG/ML IV SOLN
1000.0000 mg | Freq: Once | INTRAVENOUS | Status: AC
  Administered 2012-04-20: 1000 mg via INTRAVENOUS

## 2012-04-20 MED ORDER — BUPIVACAINE LIPOSOME 1.3 % IJ SUSP
20.0000 mL | Freq: Once | INTRAMUSCULAR | Status: AC
  Administered 2012-04-20: 20 mL
  Filled 2012-04-20: qty 20

## 2012-04-20 MED ORDER — BISACODYL 10 MG RE SUPP: 10.0000 mg | Freq: Every day | RECTAL | Status: DC | PRN

## 2012-04-20 MED ORDER — FENTANYL CITRATE 0.05 MG/ML IJ SOLN
INTRAMUSCULAR | Status: DC | PRN
  Administered 2012-04-20: 25 ug via INTRAVENOUS
  Administered 2012-04-20: 100 ug via INTRAVENOUS
  Administered 2012-04-20 (×3): 25 ug via INTRAVENOUS

## 2012-04-20 MED ORDER — DOCUSATE SODIUM 100 MG PO CAPS
100.0000 mg | ORAL_CAPSULE | Freq: Two times a day (BID) | ORAL | Status: DC
  Administered 2012-04-20 – 2012-04-23 (×6): 100 mg via ORAL

## 2012-04-20 MED ORDER — DEXAMETHASONE 6 MG PO TABS
10.0000 mg | ORAL_TABLET | Freq: Once | ORAL | Status: AC
  Administered 2012-04-21: 10 mg via ORAL
  Filled 2012-04-20: qty 1

## 2012-04-20 MED ORDER — SODIUM CHLORIDE 0.9 % IJ SOLN
INTRAMUSCULAR | Status: DC | PRN
  Administered 2012-04-20: 50 mL

## 2012-04-20 MED ORDER — LACTATED RINGERS IV SOLN: INTRAVENOUS | Status: DC

## 2012-04-20 MED ORDER — METHOCARBAMOL 100 MG/ML IJ SOLN
500.0000 mg | Freq: Four times a day (QID) | INTRAVENOUS | Status: DC | PRN
  Administered 2012-04-20: 500 mg via INTRAVENOUS
  Filled 2012-04-20: qty 5

## 2012-04-20 MED ORDER — POLYETHYLENE GLYCOL 3350 17 G PO PACK: 17.0000 g | PACK | Freq: Every day | ORAL | Status: DC | PRN

## 2012-04-20 MED ORDER — SODIUM CHLORIDE 0.9 % IV SOLN: INTRAVENOUS | Status: DC

## 2012-04-20 MED ORDER — DEXAMETHASONE SODIUM PHOSPHATE 10 MG/ML IJ SOLN
10.0000 mg | Freq: Once | INTRAMUSCULAR | Status: AC
  Filled 2012-04-20: qty 1

## 2012-04-20 MED ORDER — DIPHENHYDRAMINE HCL 12.5 MG/5ML PO ELIX: 12.5000 mg | ORAL_SOLUTION | ORAL | Status: DC | PRN

## 2012-04-20 MED ORDER — ACETAMINOPHEN 325 MG PO TABS
650.0000 mg | ORAL_TABLET | Freq: Four times a day (QID) | ORAL | Status: DC | PRN
  Administered 2012-04-22 (×4): 650 mg via ORAL
  Filled 2012-04-20 (×4): qty 2

## 2012-04-20 MED ORDER — NEOSTIGMINE METHYLSULFATE 1 MG/ML IJ SOLN
INTRAMUSCULAR | Status: DC | PRN
  Administered 2012-04-20: 3 mg via INTRAVENOUS

## 2012-04-20 MED ORDER — ACETAMINOPHEN 650 MG RE SUPP: 650.0000 mg | Freq: Four times a day (QID) | RECTAL | Status: DC | PRN

## 2012-04-20 MED ORDER — ACETAMINOPHEN 10 MG/ML IV SOLN
1000.0000 mg | Freq: Four times a day (QID) | INTRAVENOUS | Status: AC
  Administered 2012-04-20 – 2012-04-21 (×4): 1000 mg via INTRAVENOUS
  Filled 2012-04-20 (×8): qty 100

## 2012-04-20 MED ORDER — PROMETHAZINE HCL 25 MG/ML IJ SOLN: 6.2500 mg | INTRAMUSCULAR | Status: DC | PRN

## 2012-04-20 MED ORDER — MENTHOL 3 MG MT LOZG: 1.0000 | LOZENGE | OROMUCOSAL | Status: DC | PRN

## 2012-04-20 MED ORDER — 0.9 % SODIUM CHLORIDE (POUR BTL) OPTIME
TOPICAL | Status: DC | PRN
  Administered 2012-04-20: 1000 mL

## 2012-04-20 MED ORDER — FLEET ENEMA 7-19 GM/118ML RE ENEM: 1.0000 | ENEMA | Freq: Once | RECTAL | Status: AC | PRN

## 2012-04-20 MED ORDER — ONDANSETRON HCL 4 MG/2ML IJ SOLN
INTRAMUSCULAR | Status: DC | PRN
  Administered 2012-04-20: 2 mg via INTRAVENOUS

## 2012-04-20 MED ORDER — MORPHINE SULFATE 2 MG/ML IJ SOLN
1.0000 mg | INTRAMUSCULAR | Status: DC | PRN
  Administered 2012-04-20 – 2012-04-21 (×2): 2 mg via INTRAVENOUS
  Filled 2012-04-20 (×2): qty 1

## 2012-04-20 MED ORDER — PROPOFOL 10 MG/ML IV BOLUS
INTRAVENOUS | Status: DC | PRN
  Administered 2012-04-20: 100 mg via INTRAVENOUS

## 2012-04-20 MED ORDER — LIDOCAINE HCL (CARDIAC) 20 MG/ML IV SOLN
INTRAVENOUS | Status: DC | PRN
  Administered 2012-04-20: 50 mg via INTRAVENOUS

## 2012-04-20 SURGICAL SUPPLY — 62 items
BAG SPEC THK2 15X12 ZIP CLS (MISCELLANEOUS) ×3
BAG ZIPLOCK 12X15 (MISCELLANEOUS) ×6 IMPLANT
BIT DRILL 2.8X128 (BIT) ×2 IMPLANT
BLADE EXTENDED COATED 6.5IN (ELECTRODE) ×2 IMPLANT
BLADE SAW SAG 73X25 THK (BLADE) ×1
BLADE SAW SGTL 73X25 THK (BLADE) ×1 IMPLANT
CATH KIT ON-Q SILVERSOAK 5 (CATHETERS) ×1 IMPLANT
CATH KIT ON-Q SILVERSOAK 5IN (CATHETERS) ×2 IMPLANT
CLOTH BEACON ORANGE TIMEOUT ST (SAFETY) ×2 IMPLANT
CONT SPECI 4OZ STER CLIK (MISCELLANEOUS) IMPLANT
DRAPE INCISE IOBAN 66X45 STRL (DRAPES) ×2 IMPLANT
DRAPE ORTHO SPLIT 77X108 STRL (DRAPES) ×4
DRAPE POUCH INSTRU U-SHP 10X18 (DRAPES) ×2 IMPLANT
DRAPE SURG ORHT 6 SPLT 77X108 (DRAPES) ×2 IMPLANT
DRAPE U-SHAPE 47X51 STRL (DRAPES) ×2 IMPLANT
DRSG ADAPTIC 3X8 NADH LF (GAUZE/BANDAGES/DRESSINGS) ×1 IMPLANT
DRSG EMULSION OIL 3X16 NADH (GAUZE/BANDAGES/DRESSINGS) ×2 IMPLANT
DRSG MEPILEX BORDER 4X4 (GAUZE/BANDAGES/DRESSINGS) ×4 IMPLANT
DRSG MEPILEX BORDER 4X8 (GAUZE/BANDAGES/DRESSINGS) ×2 IMPLANT
DURAPREP 26ML APPLICATOR (WOUND CARE) ×2 IMPLANT
ELECT REM PT RETURN 9FT ADLT (ELECTROSURGICAL) ×2
ELECTRODE REM PT RTRN 9FT ADLT (ELECTROSURGICAL) ×1 IMPLANT
EVACUATOR 1/8 PVC DRAIN (DRAIN) ×2 IMPLANT
FACESHIELD LNG OPTICON STERILE (SAFETY) ×8 IMPLANT
GLOVE BIO SURGEON STRL SZ8 (GLOVE) ×2 IMPLANT
GLOVE BIOGEL PI IND STRL 6.5 (GLOVE) IMPLANT
GLOVE BIOGEL PI IND STRL 8 (GLOVE) ×2 IMPLANT
GLOVE BIOGEL PI INDICATOR 6.5 (GLOVE) ×2
GLOVE BIOGEL PI INDICATOR 8 (GLOVE) ×2
GLOVE ECLIPSE 6.5 STRL STRAW (GLOVE) ×2 IMPLANT
GLOVE ECLIPSE 8.0 STRL XLNG CF (GLOVE) ×2 IMPLANT
GLOVE SURG SS PI 6.5 STRL IVOR (GLOVE) ×4 IMPLANT
GOWN STRL NON-REIN LRG LVL3 (GOWN DISPOSABLE) ×4 IMPLANT
GOWN STRL REIN XL XLG (GOWN DISPOSABLE) ×2 IMPLANT
IMMOBILIZER KNEE 20 (SOFTGOODS)
IMMOBILIZER KNEE 20 THIGH 36 (SOFTGOODS) IMPLANT
IMMOBILIZER KNEE 22 UNIV (SOFTGOODS) ×1 IMPLANT
KIT BASIN OR (CUSTOM PROCEDURE TRAY) ×2 IMPLANT
MANIFOLD NEPTUNE II (INSTRUMENTS) ×2 IMPLANT
NDL SAFETY ECLIPSE 18X1.5 (NEEDLE) IMPLANT
NEEDLE HYPO 18GX1.5 SHARP (NEEDLE)
NS IRRIG 1000ML POUR BTL (IV SOLUTION) ×2 IMPLANT
PACK TOTAL JOINT (CUSTOM PROCEDURE TRAY) ×2 IMPLANT
PASSER SUT SWANSON 36MM LOOP (INSTRUMENTS) ×2 IMPLANT
POSITIONER SURGICAL ARM (MISCELLANEOUS) ×2 IMPLANT
SPONGE GAUZE 4X4 12PLY (GAUZE/BANDAGES/DRESSINGS) ×2 IMPLANT
SPONGE LAP 18X18 X RAY DECT (DISPOSABLE) ×2 IMPLANT
STAPLER VISISTAT 35W (STAPLE) ×2 IMPLANT
STRIP CLOSURE SKIN 1/2X4 (GAUZE/BANDAGES/DRESSINGS) ×1 IMPLANT
SUCTION FRAZIER TIP 10 FR DISP (SUCTIONS) ×2 IMPLANT
SUT ETHIBOND NAB CT1 #1 30IN (SUTURE) ×4 IMPLANT
SUT VIC AB 1 CT1 27 (SUTURE) ×6
SUT VIC AB 1 CT1 27XBRD ANTBC (SUTURE) ×3 IMPLANT
SUT VIC AB 2-0 CT1 27 (SUTURE) ×6
SUT VIC AB 2-0 CT1 TAPERPNT 27 (SUTURE) ×3 IMPLANT
SUT VLOC 180 0 24IN GS25 (SUTURE) ×4 IMPLANT
SWAB COLLECTION DEVICE MRSA (MISCELLANEOUS) ×2 IMPLANT
SYR 50ML LL SCALE MARK (SYRINGE) IMPLANT
TOWEL OR 17X26 10 PK STRL BLUE (TOWEL DISPOSABLE) ×4 IMPLANT
TRAY FOLEY CATH 14FRSI W/METER (CATHETERS) ×2 IMPLANT
TUBE ANAEROBIC SPECIMEN COL (MISCELLANEOUS) IMPLANT
WATER STERILE IRR 1500ML POUR (IV SOLUTION) ×2 IMPLANT

## 2012-04-20 NOTE — Transfer of Care (Signed)
Immediate Anesthesia Transfer of Care Note  Patient: Marie Coleman  Procedure(s) Performed: Procedure(s) (LRB) with comments: TOTAL HIP REVISION (Right) - CONVERSION OF  PREVIOUS HIP SURGERY  Patient Location: PACU  Anesthesia Type:General  Level of Consciousness: awake and sedated  Airway & Oxygen Therapy: Patient Spontanous Breathing and Patient connected to face mask oxygen  Post-op Assessment: Report given to PACU RN and Post -op Vital signs reviewed and stable  Post vital signs: Reviewed and stable  Complications: No apparent anesthesia complications

## 2012-04-20 NOTE — Interval H&P Note (Signed)
History and Physical Interval Note:  04/20/2012 1:57 PM  Marie Coleman  has presented today for surgery, with the diagnosis of AVASCULAR NECROSIS RIGHT HIP  The various methods of treatment have been discussed with the patient and family. After consideration of risks, benefits and other options for treatment, the patient has consented to  Procedure(s) (LRB) with comments: TOTAL HIP REVISION (Right) - CONVERSION OF  PREVIOUS HIP SURGERY as a surgical intervention .  The patient's history has been reviewed, patient examined, no change in status, stable for surgery.  I have reviewed the patient's chart and labs.  Questions were answered to the patient's satisfaction.     Loanne Drilling

## 2012-04-20 NOTE — H&P (View-Only) (Signed)
Marie Coleman  DOB: 1940-12-29 Married / Language: English / Race: White Female  Date of Admission:  04/20/2012  Chief Complaint:  Right Hip Pain  History of Present Illness The patient is a 71 year old female who comes in for a preoperative History and Physical. The patient is scheduled for a right total hip arthroplasty to be performed by Dr. Gus Rankin. Aluisio, MD at [facility] on 04/20/2012. The patient is a 71 year old female who presents with a hip problem. The patient reports right hip problems including pain symptoms that have been present for over 11 month(s).  She had a previous ORIF per Dr Darrelyn Hillock. The symptoms began following a specific injury. Symptoms reported include hip pain and pain with weightbearing The patient describes the hip problem as sharp. Note for "Hip problem": CT done last week showed AVN of the hip. She had a CT scan showing some fragmentation and collapse of a fairly considerable portion of her femoral head. She had a hip pinning done last year. She recently had a total knee on her left side and she has done great with that. The right hip is limiting what she can and can not do. The pain is occurring at all times. This is affecting her sleep at night. She is unable to do the activities she desires due to the pain. She is ready to proceed with the total hip replacement. They have been treated conservatively in the past for the above stated problem and despite conservative measures, they continue to have progressive pain and severe functional limitations and dysfunction. They have failed non-operative management including home exercise, medications. It is felt that they would benefit from undergoing total joint replacement. Risks and benefits of the procedure have been discussed with the patient and they elect to proceed with surgery. There are no active contraindications to surgery such as ongoing infection or rapidly progressive neurological  disease.   Problem List Osteoarthritis, Knee (715.96) S/P total knee arthroplasty (V43.65) Fracture of femoral neck, right (820.8) Pain, Hip (719.45) Avascular necrosis of hip (733.42)  Allergies SULFA. 10/06/2001 Rash.   Family History Mother. Living. age 65 Father. Deceased, Myocardial infarction. age 9, History of Thrombocytopenia Osteoarthritis. mother and father   Social History Tobacco use. Never smoker. never smoker Alcohol use. current drinker; drinks beer and hard liquor; only occasionally per week Illicit drug use. no Living situation. live with spouse Marital status. married Current work status. working part time Drug/Alcohol Rehab (Previously). no Pain Contract. no Exercise. Exercises daily; does running / walking and individual sport Post-Surgical Plans. Plan is to go to [facility]. Number of flights of stairs before winded. 2-3 Drug/Alcohol Rehab (Currently). no Children. 2 Advance Directives. Healthcare POA Tobacco / smoke exposure. no   Medication History Vivelle-Dot (0.0375MG /24HR Patch Biweekly, Transdermal) Active. Aspirin Adult Low Strength (81MG  Tablet Chewable, Oral) Active. Fish Oil Burp-Less (1000MG  Capsule, Oral) Active. Allegra (180MG  Tablet, Oral) Active. Citracal Calcium+D (600-40-500MG -MG-UNIT Tablet ER 24HR, Oral) Active.   Past Surgical History Rotator Cuff Repair - Left. Date: 2012. Spinal Surgery. Lower Back Surgery Arthroscopy of Knee. left Hip Fracture and Surgery. Right Hysterectomy. complete (non-cancerous) Wrist Surgery. Left Wrist Fracture - ORIF - Dr. Ranell Patrick Neck Disc Surgery   Medical History Osteoarthritis Osteoporosis Vertigo Measles   Review of Systems General:Not Present- Chills, Fever, Night Sweats, Fatigue, Weight Gain, Weight Loss and Memory Loss. Skin:Not Present- Hives, Itching, Rash, Eczema and Lesions. HEENT:Not Present- Tinnitus, Headache, Double Vision, Visual  Loss, Hearing Loss and  Dentures. Respiratory:Not Present- Shortness of breath with exertion, Shortness of breath at rest, Allergies, Coughing up blood and Chronic Cough. Cardiovascular:Not Present- Chest Pain, Racing/skipping heartbeats, Difficulty Breathing Lying Down, Murmur, Swelling and Palpitations. Gastrointestinal:Not Present- Bloody Stool, Heartburn, Abdominal Pain, Vomiting, Nausea, Constipation, Diarrhea, Difficulty Swallowing, Jaundice and Loss of appetitie. Female Genitourinary:Not Present- Blood in Urine, Urinary frequency, Weak urinary stream, Discharge, Flank Pain, Incontinence, Painful Urination, Urgency, Urinary Retention and Urinating at Night. Musculoskeletal:Not Present- Muscle Weakness, Muscle Pain, Joint Swelling, Joint Pain, Back Pain, Morning Stiffness and Spasms. Neurological:Not Present- Tremor, Dizziness, Blackout spells, Paralysis, Difficulty with balance and Weakness. Psychiatric:Not Present- Insomnia.   Vitals Weight: 126 lb Height: 67 in Body Surface Area: 1.64 m Body Mass Index: 19.73 kg/m Pulse: 68 (Regular) Resp.: 12 (Unlabored) BP: 104/60 (Sitting, Left Arm, Standard)    Physical Exam The physical exam findings are as follows:  Note: Patient is a 71 year old female with continued hip pain.   General Mental Status - Alert, cooperative and good historian. General Appearance- pleasant. Not in acute distress. Orientation- Oriented X3. Build & Nutrition- Well nourished and Well developed.   Head and Neck Head- normocephalic, atraumatic . Neck Global Assessment- supple. no bruit auscultated on the right and no bruit auscultated on the left.   Eye Pupil- Bilateral- Regular and Round. Motion- Bilateral- EOMI.   Chest and Lung Exam Auscultation: Breath sounds:- clear at anterior chest wall and - clear at posterior chest wall. Adventitious sounds:- No Adventitious  sounds.   Cardiovascular Auscultation:Rhythm- Regular rate and rhythm. Heart Sounds- S1 WNL and S2 WNL. Murmurs & Other Heart Sounds:Auscultation of the heart reveals - No Murmurs.   Abdomen Palpation/Percussion:Tenderness- Abdomen is non-tender to palpation. Rigidity (guarding)- Abdomen is soft. Auscultation:Auscultation of the abdomen reveals - Bowel sounds normal.   Female Genitourinary  Not done, not pertinent to present illness  Musculoskeletal She is a very pleasant, well developed female. She is alert and oriented. No apparent distress. The left hip with normal range of motion and no discomfort. The right hip flexion is to about 110, rotation in 20, out 30 and abduction 30 with discomfort.  RADIOGRAPHS: AP of the pelvis, lateral of the hip and she has developed some collapse of the femoral head around the screws. I reviewed her CT scan the other day and it showed that fragmentation.  Assessment & Plan Avascular necrosis of hip (733.42) Impression: Right Hip  Note: Patient is for a right total hip replacement by Dr. Lequita Halt.  Plan is to go to [facility] and she worked with the therapist Josh last surgery.  PCP - Dr. Vivi Ferns  Signed electronically by Roberts Gaudy, PA-C

## 2012-04-20 NOTE — Anesthesia Postprocedure Evaluation (Signed)
  Anesthesia Post-op Note  Patient: Marie Coleman  Procedure(s) Performed: Procedure(s) (LRB): TOTAL HIP REVISION (Right)  Patient Location: PACU  Anesthesia Type: General  Level of Consciousness: awake and alert   Airway and Oxygen Therapy: Patient Spontanous Breathing  Post-op Pain: mild  Post-op Assessment: Post-op Vital signs reviewed, Patient's Cardiovascular Status Stable, Respiratory Function Stable, Patent Airway and No signs of Nausea or vomiting  Post-op Vital Signs: stable  Complications: No apparent anesthesia complications

## 2012-04-20 NOTE — Anesthesia Preprocedure Evaluation (Signed)

## 2012-04-20 NOTE — Plan of Care (Signed)
Problem: Consults Goal: Diagnosis- Total Joint Replacement Revision right total hip     

## 2012-04-20 NOTE — Brief Op Note (Signed)
04/20/2012  3:25 PM  PATIENT:  Francena Hanly  71 y.o. female  PRE-OPERATIVE DIAGNOSIS:  AVASCULAR NECROSIS RIGHT HIP  POST-OPERATIVE DIAGNOSIS:  avasular necrosis right hip  PROCEDURE:  Procedure(s) (LRB) with comments: TOTAL HIP REVISION (Right) - CONVERSION OF  PREVIOUS HIP SURGERY to right THA  SURGEON:  Surgeon(s) and Role:    * Loanne Drilling, MD - Primary  PHYSICIAN ASSISTANT:   ASSISTANTS: Dimitri Ped, PA-C   ANESTHESIA:   general  EBL:  Total I/O In: 1000 [I.V.:1000] Out: 100 [Blood:100]  BLOOD ADMINISTERED:none  DRAINS: (Medium) Hemovact drain(s) in the right hip with  Suction Open   LOCAL MEDICATIONS USED:  OTHER Exparel 20 ml  COUNTS:  YES  TOURNIQUET:  * No tourniquets in log *  DICTATION: .Other Dictation: Dictation Number [id number]  PLAN OF CARE: Admit to inpatient   PATIENT DISPOSITION:  PACU - hemodynamically stable.

## 2012-04-21 ENCOUNTER — Encounter (HOSPITAL_COMMUNITY): Payer: Self-pay | Admitting: Orthopedic Surgery

## 2012-04-21 LAB — CBC
Platelets: 180 10*3/uL (ref 150–400)
RBC: 3.2 MIL/uL — ABNORMAL LOW (ref 3.87–5.11)
WBC: 7.7 10*3/uL (ref 4.0–10.5)

## 2012-04-21 LAB — BASIC METABOLIC PANEL
CO2: 28 mEq/L (ref 19–32)
Chloride: 100 mEq/L (ref 96–112)
Potassium: 3.9 mEq/L (ref 3.5–5.1)
Sodium: 134 mEq/L — ABNORMAL LOW (ref 135–145)

## 2012-04-21 MED ORDER — POLYSACCHARIDE IRON COMPLEX 150 MG PO CAPS
150.0000 mg | ORAL_CAPSULE | Freq: Every day | ORAL | Status: DC
  Administered 2012-04-21 – 2012-04-23 (×3): 150 mg via ORAL
  Filled 2012-04-21 (×3): qty 1

## 2012-04-21 MED ORDER — SODIUM CHLORIDE 0.9 % IV BOLUS (SEPSIS)
250.0000 mL | Freq: Once | INTRAVENOUS | Status: AC
  Administered 2012-04-21: 250 mL via INTRAVENOUS

## 2012-04-21 MED ORDER — SODIUM CHLORIDE 0.9 % IV BOLUS (SEPSIS)
500.0000 mL | Freq: Once | INTRAVENOUS | Status: AC
  Administered 2012-04-21: 500 mL via INTRAVENOUS

## 2012-04-21 NOTE — Progress Notes (Signed)
1245 patient up to bedside commode felt dizzy, c/o feeling like i am going to pass out. Returned to bed vital signs taken, patient total assist to bed.  B/p 79/45 dinamap , 80/38 manual.  o2 sat 100 % 2 liters, hr 56 resp 12.  Called D perkins PA  With orders received.  Will continue to monitor  D Susann Givens

## 2012-04-21 NOTE — Progress Notes (Addendum)
Physical Therapy Treatment Patient Details Name: Marie Coleman MRN: [id number] DOB: 1941/05/22 Today's Date: 04/21/2012 Time: 4098-1191 PT Time Calculation (min): 29 min  PT Assessment / Plan / Recommendation Comments on Treatment Session  Pt performed exercises in bed.  Pt then wished to use BSC to void.  BP 107/65 mmHg prior to bed mobility.  Pt did become dizzy upon sitting but it resolved within a couple minutes.  Pt then assisted to [facility] with no increased dizziness.  BP 110/64 mmHg upon return to supine.    Follow Up Recommendations  SNF     Does the patient have the potential to tolerate intense rehabilitation     Barriers to Discharge        Equipment Recommendations  None recommended by PT    Recommendations for Other Services    Frequency     Plan Discharge plan remains appropriate;Frequency remains appropriate    Precautions / Restrictions Precautions Precautions: Fall;Posterior Hip Precaution Comments: handout posted in room Restrictions RLE Weight Bearing: Weight bearing as tolerated   Pertinent Vitals/Pain Pt reports no pain at rest and minimal pain with mobility.    Mobility  Bed Mobility Bed Mobility: Supine to Sit;Sit to Supine Supine to Sit: 4: Min assist;HOB flat Sit to Supine: 4: Min assist;HOB flat Details for Bed Mobility Assistance: assist for R LE, verbal cues for technique, pt with dizziness upon sitting EOB but resolved within a couple minutes Transfers Transfers: Sit to Stand;Stand to [employer] Transfers Sit to Stand: 4: Min assist;With upper extremity assist;From bed;From chair/3-in-1 Stand to Sit: 4: Min assist;To chair/3-in-1;To bed;With upper extremity assist Stand Pivot Transfers: 4: Min guard Details for Transfer Assistance: verbal cues for hand placement and R LE forward, pt reported feeling "good to sit up" while on BSC, no dizziness    Exercises   Pt performed the following exercises in supine x15 reps each with R LE: ankle pumps,  quad sets, glut sets, towel squeezes all actively and then SAQ, heel slides, hip abduction with active assist.   PT Diagnosis:    PT Problem List:   PT Treatment Interventions:     PT Goals Acute Rehab PT Goals PT Goal: Supine/Side to Sit - Progress: Progressing toward goal PT Goal: Sit to Supine/Side - Progress: Progressing toward goal PT Goal: Sit to Stand - Progress: Progressing toward goal PT Goal: Stand to Sit - Progress: Progressing toward goal PT Goal: Perform Home Exercise Program - Progress: Progressing toward goal  Visit Information  Last PT Received On: 04/21/12 Assistance Needed: +2    Subjective Data  Subjective: I'd like to try to use the [facility].    Cognition  Overall Cognitive Status: Appears within functional limits for tasks assessed/performed    Balance     End of Session PT - End of Session Activity Tolerance: Other (comment) (low BP, dizziness)   GP     Mordche Hedglin,KATHrine E 04/21/2012, 3:55 PM Pager: [phone]

## 2012-04-21 NOTE — Progress Notes (Signed)
Clinical Social Work Department CLINICAL SOCIAL WORK PLACEMENT NOTE 04/21/2012  Patient:  Marie Coleman, Marie Coleman  Account Number:  [id number] Admit date:  04/20/2012  Clinical Social Worker:  Cori Razor, LCSW  Date/time:  04/21/2012 11:36 AM  Clinical Social Work is seeking post-discharge placement for this patient at the following level of care:   SKILLED NURSING   (*CSW will update this form in Epic as items are completed)     Patient/family provided with [facility] Department of Clinical Social Work's list of facilities offering this level of care within the geographic area requested by the patient (or if unable, by the patient's family).    Patient/family informed of their freedom to choose among providers that offer the needed level of care, that participate in Medicare, Medicaid or managed care program needed by the patient, have an available bed and are willing to accept the patient.    Patient/family informed of [facility]' ownership interest in [facility], as well as of the fact that they are under no obligation to receive care at this facility.  PASARR submitted to EDS on  PASARR number received from [employer]DS on 05/16/2011  FL2 transmitted to all facilities in geographic area requested by pt/family on  04/21/2012 FL2 transmitted to all facilities within larger geographic area on   Patient informed that his/her managed care company has contracts with or will negotiate with  certain facilities, including the following:     Patient/family informed of bed offers received:  04/21/2012 Patient chooses bed at [facility] Physician recommends and patient chooses bed at    Patient to be transferred to [facility] on   Patient to be transferred to facility by   The following physician request were entered in Epic:   Additional Comments:  Cori Razor LCSW [phone]

## 2012-04-21 NOTE — Evaluation (Signed)
Physical Therapy Evaluation Patient Details Name: Marie Coleman MRN: [id number] DOB: 09-Aug-1940 Today's Date: 04/21/2012 Time: 8657-8469 PT Time Calculation (min): 11 min  PT Assessment / Plan / Recommendation Clinical Impression  Pt s/p R THR.  Pt would benefit from acute PT services in order to improve independence with transfers and ambulation while adhering to precautions.  Eval limited due to dizziness upon sitting which pt requested return to supine position due to feeling about to pass out and RN notified.  Pt plans to d/c to [facility]    PT Assessment  Patient needs continued PT services    Follow Up Recommendations  Post acute inpatient    Does the patient have the potential to tolerate intense rehabilitation   No, Recommend SNF  Barriers to Discharge        Equipment Recommendations  None recommended by PT    Recommendations for Other Services     Frequency 7X/week    Precautions / Restrictions Precautions Precautions: Fall;Posterior Hip Precaution Comments: handout posted in room Restrictions RLE Weight Bearing: Weight bearing as tolerated   Pertinent Vitals/Pain Pt reports slight discomfort R hip but no pain.      Mobility  Bed Mobility Bed Mobility: Supine to Sit;Sit to Supine Supine to Sit: 4: Min assist;HOB flat Sit to Supine: HOB flat;3: Mod assist Details for Bed Mobility Assistance: assist for R LE upon sitting and then bilateral LEs to lay down due to dizziness Transfers Transfers: Not assessed (pt dizzy, felt about to "pass out" upon sitting EOB)    Shoulder Instructions     Exercises     PT Diagnosis: Difficulty walking  PT Problem List: Decreased strength;Decreased activity tolerance;Decreased knowledge of use of DME;Decreased knowledge of precautions;Decreased mobility;Decreased range of motion PT Treatment Interventions: DME instruction;Gait training;Functional mobility training;Therapeutic exercise;Therapeutic activities;Patient/family  education   PT Goals Acute Rehab PT Goals PT Goal Formulation: With patient Time For Goal Achievement: 04/28/12 Potential to Achieve Goals: Good Pt will go Supine/Side to Sit: with supervision PT Goal: Supine/Side to Sit - Progress: Goal set today Pt will go Sit to Supine/Side: with supervision PT Goal: Sit to Supine/Side - Progress: Goal set today Pt will go Sit to Stand: with supervision PT Goal: Sit to Stand - Progress: Goal set today Pt will go Stand to Sit: with supervision PT Goal: Stand to Sit - Progress: Goal set today Pt will Ambulate: 51 - 150 feet;with supervision;with least restrictive assistive device PT Goal: Ambulate - Progress: Goal set today Pt will Perform Home Exercise Program: with supervision, verbal cues required/provided PT Goal: Perform Home Exercise Program - Progress: Goal set today  Visit Information  Last PT Received On: 04/21/12 Assistance Needed: +2    Subjective Data  Subjective: I got dizzy when they dangled me.   Prior Functioning  Home Living Available Help at Discharge: Skilled Nursing Facility Home Layout: Laundry or work area in basement Home Adaptive Equipment: Walker - rolling Additional Comments: Pt reports she plans to go to [facility] for rehab upon d/c. Prior Function Level of Independence: Independent with assistive device(s) Communication Communication: No difficulties    Cognition  Overall Cognitive Status: Appears within functional limits for tasks assessed/performed Arousal/Alertness: Awake/alert Orientation Level: Appears intact for tasks assessed Behavior During Session: [facility] for tasks performed    Extremity/Trunk Assessment Right Lower Extremity Assessment RLE ROM/Strength/Tone: Deficits RLE ROM/Strength/Tone Deficits: unable to move against gravity requiring assist for mobility Left Lower Extremity Assessment LLE ROM/Strength/Tone: [facility] for tasks assessed  Balance    End of Session PT - End of Session Activity  Tolerance: Other (comment) (dizziness) Patient left: in bed;with call bell/phone within reach Nurse Communication: Other (comment) (pt dizzy with sitting EOB)  GP     Conna Terada,KATHrine E 04/21/2012, 11:47 AM Pager: [phone]

## 2012-04-21 NOTE — Progress Notes (Signed)
Clinical Social Work Department BRIEF PSYCHOSOCIAL ASSESSMENT 04/21/2012  Patient:  ODIE, RAUEN     Account Number:  [id number]     Admit date:  04/20/2012  Clinical Social Worker:  Candie Chroman  Date/Time:  04/21/2012 11:25 AM  Referred by:  Physician  Date Referred:  04/21/2012 Referred for  SNF Placement   Other Referral:   Interview type:  Patient Other interview type:    PSYCHOSOCIAL DATA Living Status:  HUSBAND Admitted from facility:   Level of care:   Primary support name:  Evalina Field Primary support relationship to patient:  SPOUSE Degree of support available:   unclear    CURRENT CONCERNS Current Concerns  Post-Acute Placement   Other Concerns:    SOCIAL WORK ASSESSMENT / PLAN Pt is a 71 yr old female living at home prior to hospitalization. CSW met with pt to assist with d/c planning needs. Pt has made prior arrangements to have ST Rehab at [facility] following hospital d/c. SNF has been contacted and plan has been confirmed. CSW will continue to follow to assist with d/c planning to SNF.   Assessment/plan status:  Psychosocial Support/Ongoing Assessment of Needs Other assessment/ plan:   Information/referral to community resources:   None needed at this time.    PATIENT'S/FAMILY'S RESPONSE TO PLAN OF CARE: Pt is looking forward to rehab at [facility].    Cori Razor LCSW [phone]

## 2012-04-21 NOTE — Progress Notes (Signed)
Utilization review completed.  

## 2012-04-21 NOTE — Op Note (Signed)
NAMETAJ, ARTEAGA                  ACCOUNT NO.:  [id number]  MEDICAL RECORD NO.:  [id number]  LOCATION:  1610                         FACILITY:  [facility]  PHYSICIAN:  Ollen Gross, M.D.    DATE OF BIRTH:  05-13-1941  DATE OF PROCEDURE:  04/20/2012 DATE OF DISCHARGE:                              OPERATIVE REPORT   PREOPERATIVE DIAGNOSIS:  Avascular necrosis, right femoral head.  POSTOPERATIVE DIAGNOSE:  Avascular necrosis, right femoral head.  PROCEDURE:  Conversion of previous hip surgery to right total hip arthroplasty.  SURGEON:  Ollen Gross, M.D.  ASSISTANT:  Dimitri Ped, PA-C  ANESTHESIA:  General.  ESTIMATED BLOOD LOSS:  250 mL.  DRAINS:  Hemovac x1.  COMPLICATIONS:  None.  CONDITION:  Stable to recovery.  BRIEF CLINICAL NOTE:  Ms. Marie Coleman is a 71 year old female who had a right hip pinning in approximately a year ago and was doing well initially, but then has had progressively worsening groin pain and dysfunction.  CT scan showed that she had avascular necrosis with an area of collapse of the femoral head.  She has not responded to usual nonoperative management and presents now for conversion of this hip surgery to a total hip arthroplasty.  PROCEDURE IN DETAIL:  After successful administration of general anesthetic, the patient was placed in the left lateral decubitus position with the right side up and held with the hip positioner.  Right lower extremity was isolated from her perineum with plastic drapes and prepped and draped in the usual sterile fashion.  A short posterolateral incision was made with a 10-blade through the subcutaneous tissue to the level of the fascia lata, which incised in line with the skin incision. The sciatic nerve was palpated and protected, and short rotator and capsule isolated off the femur.  Capsulotomy was performed and the hip was dislocated.  I then reduce the hip back into the joint and found the screw heads for the three  cannulated screws that were previously in the femoral neck.  The screws were removed.  I then dislocated the hip and marked the center of the femoral head.  A trial prosthesis was placed such at the center of the trial head corresponds to the center of the native femoral head.  Osteotomy line was marked on the femoral neck and osteotomy was made with an oscillating saw.  The femoral head was removed.  We gained access to the femoral canal with the starter reamer.  The canal was thoroughly irrigated with saline solution to remove the fatty contents.  Axial reaming was then performed up to 15.5 mm proximal reaming to 78F and the sleeve machine to an extra-extra large.  A trial of 78F extra-extra large sleeve was then placed into the proximal femur.  Sponge was used to pack the canal and then the femur was retracted anteriorly to gain acetabular exposure.  Acetabular retractors were placed, and labrum and osteophytes were removed.  Reaming was performed up to 51 mm and then a 52-mm pinnacle acetabular shell was impacted in an anatomic position.  We did not need any additional dome screws.  A 32-mm neutral +4 marathon liner was placed.  Sponge was removed  from the femoral canal and trial prosthesis was placed, which was a 20 x 15 with a 36+ 8 neck matching native anteversion.  A 32+ 0 trial head was placed.  The hip was reduced with outstanding stability.  There was full extension, full external rotation, 70 degrees of flexion, 40 degrees of adduction, 90 degrees internal rotation, 90 degrees of flexion and 70 degrees of internal rotation.  By placing the right leg on top of the left, I felt as though the leg lengths were equal.  Hips were dislocated and trials were removed.  Permanent 33F extra-extra large sleeve was placed in the proximal femur and a 20 x 15 stem with a 36+ 8 neck matching native anteversion.  The 32+ 0 ceramic head was placed and the hips reduced as same stability  parameters.  The wound was copiously irrigated with saline solution and then the short rotators and capsule were reattached to the femur through drill holes with Ethibond suture.  A 20 mL of Exparel with 50 mL of saline were injected into the short rotators, capsule, gluteal muscles and fascia lata.  The fascia lata was then closed over Hemovac drain with running #1 V-Loc suture.  Subcu was closed in two layers with interrupted 2-0 Vicryl and subcuticular running 4-0 Monocryl.  The drain was hooked to suction.  Incision was cleaned and dried.  Steri-Strips and a bulky sterile dressing were applied.  She was then placed into a knee immobilizer, awakened and transported to recovery in stable condition.     Ollen Gross, M.D.     FA/MEDQ  D:  04/20/2012  T:  04/21/2012  Job:  [id number]

## 2012-04-21 NOTE — Progress Notes (Signed)
   Subjective: 1 Day Post-Op Procedure(s) (LRB): TOTAL HIP REVISION (Right) Patient reports pain as mild.  More problems with sore throat and being hoarse. She did try to dangle yesterday but could not due to being lightheaded.  Noted low BP. Patient seen in rounds with Dr. Lequita Halt. Patient is well, but has had some minor complaints of pain on swallowing because of the ET tube.  Encourage lozenges or spray for throat. We will start therapy today.  Plan is to go Skilled nursing facility after hospital stay.  Objective: Vital signs in last 24 hours: Temp:  [97.4 F (36.3 C)-99.6 F (37.6 C)] 99.6 F (37.6 C) (11/05 0603) Pulse Rate:  [63-84] 84  (11/05 0603) Resp:  [14-20] 14  (11/05 0603) BP: (95-125)/(51-73) 95/56 mmHg (11/05 0603) SpO2:  [100 %] 100 % (11/05 0603) FiO2 (%):  [100 %] 100 % (11/04 1645) Weight:  [56.246 kg (124 lb)] 56.246 kg (124 lb) (11/04 2201)  Intake/Output from previous day:  Intake/Output Summary (Last 24 hours) at 04/21/12 0747 Last data filed at 04/21/12 0600  Gross per 24 hour  Intake   2800 ml  Output   2000 ml  Net    800 ml    Intake/Output this shift: UOP 600  Labs:  [facility] 04/21/12 0445  HGB 9.3*    Basename 04/21/12 0445  WBC 7.7  RBC 3.20*  HCT 28.4*  PLT 180    Basename 04/21/12 0445  NA 134*  K 3.9  CL 100  CO2 28  BUN 10  CREATININE 0.64  GLUCOSE 133*  CALCIUM 8.2*   No results found for this basename: LABPT:2,INR:2 in the last 72 hours  EXAM General - Patient is Alert, Appropriate and Oriented Extremity - Neurovascular intact Sensation intact distally Dorsiflexion/Plantar flexion intact Dressing - dressing C/D/I Motor Function - intact, moving foot and toes well on exam.  Hemovac pulled without difficulty.  Past Medical History  Diagnosis Date  . Vertigo   . Arthritis     OA AND PAIN LEFT KNEE AND PAIN IN SHOULDERS, HANDS, LOWER BACK  . Osteoporosis   . Seasonal allergies     Assessment/Plan: 1 Day  Post-Op Procedure(s) (LRB): TOTAL HIP REVISION (Right) Principal Problem:  *Avascular necrosis of bone of hip Active Problems:  Postop Acute blood loss anemia  Postop Hyponatremia  Estimated Body mass index is 19.42 kg/(m^2) as calculated from the following:   Height as of this encounter: 5\' 7" (1.702 m).   Weight as of this encounter: 124 lb(56.246 kg). Advance diet Up with therapy Discharge to SNF [facility]  Will give extra fluids this am due to soft blood pressure. DVT Prophylaxis - Xarelto, ASA 81 mg on hold Weight Bearing As Tolerated right Leg D/C Knee Immobilizer Hemovac Pulled Begin Therapy Hip Preacutions No vaccines.  Mildred Tuccillo 04/21/2012, 7:47 AM

## 2012-04-22 ENCOUNTER — Encounter (HOSPITAL_COMMUNITY): Payer: Self-pay | Admitting: *Deleted

## 2012-04-22 LAB — CBC
HCT: 24.5 % — ABNORMAL LOW (ref 36.0–46.0)
Hemoglobin: 8.1 g/dL — ABNORMAL LOW (ref 12.0–15.0)
MCH: 29.5 pg (ref 26.0–34.0)
MCHC: 33.1 g/dL (ref 30.0–36.0)
MCV: 89.1 fL (ref 78.0–100.0)
RBC: 2.75 MIL/uL — ABNORMAL LOW (ref 3.87–5.11)

## 2012-04-22 LAB — BASIC METABOLIC PANEL
BUN: 9 mg/dL (ref 6–23)
CO2: 25 mEq/L (ref 19–32)
Calcium: 8.2 mg/dL — ABNORMAL LOW (ref 8.4–10.5)
Creatinine, Ser: 0.54 mg/dL (ref 0.50–1.10)
Glucose, Bld: 159 mg/dL — ABNORMAL HIGH (ref 70–99)
Sodium: 136 mEq/L (ref 135–145)

## 2012-04-22 NOTE — Evaluation (Signed)
Occupational Therapy Evaluation Patient Details Name: Marie Coleman MRN: [id number] DOB: July 14, 1940 Today's Date: 04/22/2012 Time: 4098-1191 OT Time Calculation (min): 31 min  OT Assessment / Plan / Recommendation Clinical Impression  This 71 year old female was admitted for R THA due to AVN.  She is appropriate for skilled OT to increase independence and safety with ADLs.    OT Assessment  Patient needs continued OT Services    Follow Up Recommendations  SNF    Barriers to Discharge      Equipment Recommendations  None recommended by OT;None recommended by PT    Recommendations for Other Services    Frequency  Min 2X/week    Precautions / Restrictions Precautions Precautions: Fall;Posterior Hip Restrictions RLE Weight Bearing: Weight bearing as tolerated   Pertinent Vitals/Pain No pain    ADL  Grooming: Simulated;Set up Where Assessed - Grooming: Unsupported sitting Upper Body Bathing: Performed;Set up Where Assessed - Upper Body Bathing: Unsupported sitting Lower Body Bathing: Performed;Minimal assistance (with ae) Where Assessed - Lower Body Bathing: Supported sit to stand Upper Body Dressing: Performed;Set up Where Assessed - Upper Body Dressing: Unsupported sitting Lower Body Dressing: Performed;Minimal assistance (with AE--socks only) Where Assessed - Lower Body Dressing: Supported sit to stand Toilet Transfer: Simulated;Minimal assistance (bed to chair) Toilet Transfer Method: Stand pivot Toileting - Clothing Manipulation and Hygiene: Simulated;Min guard Where Assessed - Toileting Clothing Manipulation and Hygiene: Standing Transfers/Ambulation Related to ADLs: min A bed mobiilty:  min cues for no internal rotation ADL Comments: Educated on AE and THPs as we worked through ADL    OT Diagnosis: Generalized weakness  OT Problem List: Decreased strength;Decreased activity tolerance;Decreased knowledge of use of DME or AE;Decreased knowledge of precautions OT  Treatment Interventions: Self-care/ADL training;Energy conservation;Patient/family education;DME and/or AE instruction   OT Goals Acute Rehab OT Goals OT Goal Formulation: With patient Time For Goal Achievement: 04/29/12 Potential to Achieve Goals: Good ADL Goals Pt Will Perform Lower Body Bathing: with supervision;Sit to stand from chair;with adaptive equipment ADL Goal: Lower Body Bathing - Progress: Goal set today Pt Will Perform Lower Body Dressing: with supervision;Sit to stand from chair;with adaptive equipment ADL Goal: Lower Body Dressing - Progress: Goal set today Pt Will Transfer to Toilet: with supervision;Ambulation;3-in-1;Maintaining hip precautions;with cueing (comment type and amount) (with min cues) ADL Goal: Toilet Transfer - Progress: Goal set today  Visit Information  Last OT Received On: 04/22/12 Assistance Needed: +1    Subjective Data  Subjective: I didn't know there would be so much to this (hip sx) Patient Stated Goal: Go to [facility] [employer] [facility]   Prior Functioning     Home Living Available Help at Discharge: Skilled Nursing Facility Bathroom Shower/Tub: Walk-in shower Bathroom Toilet: Standard Home Adaptive Equipment: Environmental consultant - rolling Prior Function Level of Independence: Independent with assistive device(s) Communication Communication: No difficulties Dominant Hand: Right         Vision/Perception     Cognition  Overall Cognitive Status: Appears within functional limits for tasks assessed/performed Arousal/Alertness: Awake/alert Orientation Level: Appears intact for tasks assessed Behavior During Session: [facility] for tasks performed    Extremity/Trunk Assessment Right Upper Extremity Assessment RUE ROM/Strength/Tone: [facility] for tasks assessed Left Upper Extremity Assessment LUE ROM/Strength/Tone: WFL for tasks assessed     Mobility Bed Mobility Supine to Sit: 4: Min assist;HOB flat Transfers Sit to Stand: 4: Min assist;From bed;With upper  extremity assist     Shoulder Instructions     Exercise     Balance  End of Session OT - End of Session Activity Tolerance: Patient tolerated treatment well Patient left: in chair;with call bell/phone within reach  GO     Marlo Goodrich 04/22/2012,  Rosalia Mcavoy, OTR/L [phone] 04/22/2012

## 2012-04-22 NOTE — Progress Notes (Signed)
   Subjective: 2 Days Post-Op Procedure(s) (LRB): TOTAL HIP REVISION (Right) Patient reports pain as mild.  Doing much better than yesterday No complaints of dizziness and light headedness Plan is to go Skilled nursing facility after hospital stay.  Objective: Vital signs in last 24 hours: Temp:  [98.3 F (36.8 C)-99.4 F (37.4 C)] 98.3 F (36.8 C) (11/06 0440) Pulse Rate:  [65-77] 74  (11/06 0440) Resp:  [12-16] 16  (11/06 0440) BP: (79-110)/(38-64) 94/57 mmHg (11/06 0440) SpO2:  [92 %-100 %] 97 % (11/06 0440)  Intake/Output from previous day:  Intake/Output Summary (Last 24 hours) at 04/22/12 1101 Last data filed at 04/22/12 0600  Gross per 24 hour  Intake 4061.66 ml  Output   2600 ml  Net 1461.66 ml    Intake/Output this shift:    Labs:  Basename 04/22/12 0425 04/21/12 0445  HGB 8.1* 9.3*    Basename 04/22/12 0425 04/21/12 0445  WBC 7.0 7.7  RBC 2.75* 3.20*  HCT 24.5* 28.4*  PLT 162 180    Basename 04/22/12 0425 04/21/12 0445  NA 136 134*  K 3.7 3.9  CL 104 100  CO2 25 28  BUN 9 10  CREATININE 0.54 0.64  GLUCOSE 159* 133*  CALCIUM 8.2* 8.2*   No results found for this basename: LABPT:2,INR:2 in the last 72 hours  EXAM General - Patient is Alert, Appropriate and Oriented Extremity - Neurologically intact Neurovascular intact Incision: dressing C/D/I No cellulitis present Compartment soft Dressing/Incision - clean, dry, no drainage Motor Function - intact, moving foot and toes well on exam.   Past Medical History  Diagnosis Date  . Vertigo   . Arthritis     OA AND PAIN LEFT KNEE AND PAIN IN SHOULDERS, HANDS, LOWER BACK  . Osteoporosis   . Seasonal allergies     Assessment/Plan: 2 Days Post-Op Procedure(s) (LRB): TOTAL HIP REVISION (Right) Principal Problem:  *Avascular necrosis of bone of hip Active Problems:  Postop Acute blood loss anemia  Postop Hyponatremia   Advance diet Up with therapy D/C IV fluids Plan for discharge  tomorrow Will continue to observe hemoglobin as she is currently asymptomatic  DVT Prophylaxis - Xarelto Weight Bearing As Tolerated right Leg  Jontae Adebayo V 04/22/2012, 11:01 AM

## 2012-04-22 NOTE — Progress Notes (Signed)
Physical Therapy Treatment Note   04/22/12 1500  PT Visit Information  Last PT Received On 04/22/12  Assistance Needed +1  PT Time Calculation  PT Start Time 1359  PT Stop Time 1412  PT Time Calculation (min) 13 min  Subjective Data  Subjective I don't like the stairs at [facility].  Can we do them here?  Precautions  Precautions Fall;Posterior Hip  Precaution Comments pt able to verbalize hip precautions  Restrictions  RLE Weight Bearing WBAT  Cognition  Overall Cognitive Status Appears within functional limits for tasks assessed/performed  Bed Mobility  Bed Mobility Supine to Sit;Sit to Supine  Supine to Sit 5: Supervision  Sit to Supine 5: Supervision  Details for Bed Mobility Assistance increased time  Transfers  Transfers Sit to Stand;Stand to Sit  Sit to Stand 4: Min guard;With upper extremity assist;From bed  Stand to Sit 4: Min guard;With upper extremity assist;To bed  Details for Transfer Assistance verbal cues for R LE forward  Ambulation/Gait  Ambulation/Gait Assistance 4: Min guard  Ambulation Distance (Feet) 120 Feet  Assistive device Rolling walker  Ambulation/Gait Assistance Details verbal cue for step length and turning toward unaffected LE  Gait Pattern Step-to pattern;Antalgic  Stairs Yes  Stairs Assistance 4: Min guard  Stairs Assistance Details (indicate cue type and reason) verbal cues for safe technique  Stair Management Technique Two rails;Step to pattern;Forwards  Number of Stairs 2   PT - End of Session  Activity Tolerance Patient tolerated treatment well  Patient left in bed;with call bell/phone within reach  PT - Assessment/Plan  Comments on Treatment Session Pt ambulated in hallway and wished to practice stairs while here so also performed those.  Pt reports 1/10 "really just sore."  PT Plan Discharge plan remains appropriate;Frequency remains appropriate  Follow Up Recommendations SNF  Equipment Recommended None recommended by OT;None  recommended by PT  Acute Rehab PT Goals  PT Goal: Supine/Side to Sit - Progress Met  PT Goal: Sit to Supine/Side - Progress Met  PT Goal: Sit to Stand - Progress Progressing toward goal  PT Goal: Stand to Sit - Progress Progressing toward goal  PT Goal: Ambulate - Progress Progressing toward goal  PT General Charges  $$ ACUTE PT VISIT 1 Procedure  PT Treatments  $Gait Training 8-22 mins    Zenovia Jarred, PT Pager: [phone]

## 2012-04-22 NOTE — Progress Notes (Signed)
Physical Therapy Treatment Patient Details Name: Marie Coleman MRN: [id number] DOB: 23-Oct-1940 Today's Date: 04/22/2012 Time: 7829-5621 PT Time Calculation (min): 23 min  PT Assessment / Plan / Recommendation Comments on Treatment Session  Pt able to tolerate ambulation today with no dizziness and also performed exercises.    Follow Up Recommendations  SNF     Does the patient have the potential to tolerate intense rehabilitation     Barriers to Discharge        Equipment Recommendations  None recommended by OT;None recommended by PT    Recommendations for Other Services    Frequency     Plan Discharge plan remains appropriate;Frequency remains appropriate    Precautions / Restrictions Precautions Precautions: Fall;Posterior Hip Restrictions RLE Weight Bearing: Weight bearing as tolerated   Pertinent Vitals/Pain Pt reports no pain only soreness.    Mobility  Bed Mobility Supine to Sit: 4: Min assist;HOB flat Transfers Transfers: Sit to Stand;Stand to Sit Sit to Stand: 4: Min guard;With upper extremity assist;From chair/3-in-1 Stand to Sit: 4: Min guard;With upper extremity assist;To chair/3-in-1 Details for Transfer Assistance: verbal cues for R LE forward Ambulation/Gait Ambulation/Gait Assistance: 4: Min guard Ambulation Distance (Feet): 80 Feet Assistive device: Rolling walker Ambulation/Gait Assistance Details: verbal cues for sequence, RW distance, step length, pt denies dizziness with ambulation Gait Pattern: Step-to pattern;Antalgic Gait velocity: decreased    Exercises Total Joint Exercises Ankle Circles/Pumps: AROM;Both;15 reps Quad Sets: AROM;Both;15 reps Gluteal Sets: AROM;Both;15 reps Towel Squeeze: AROM;Both;15 reps Heel Slides: AAROM;Right;15 reps Hip ABduction/ADduction: AROM;Right;15 reps Straight Leg Raises: AAROM;Right;15 reps Long Arc Quad: AROM;Right   PT Diagnosis:    PT Problem List:   PT Treatment Interventions:     PT Goals Acute  Rehab PT Goals PT Goal: Sit to Stand - Progress: Progressing toward goal PT Goal: Stand to Sit - Progress: Progressing toward goal PT Goal: Ambulate - Progress: Progressing toward goal PT Goal: Perform Home Exercise Program - Progress: Progressing toward goal  Visit Information  Last PT Received On: 04/22/12 Assistance Needed: +1    Subjective Data  Subjective: I'm feeling much better today.   Cognition  Overall Cognitive Status: Appears within functional limits for tasks assessed/performed Arousal/Alertness: Awake/alert Orientation Level: Appears intact for tasks assessed Behavior During Session: [facility] for tasks performed    Balance     End of Session PT - End of Session Activity Tolerance: Patient tolerated treatment well Patient left: in chair;with call bell/phone within reach   GP     Marie Coleman,Marie Coleman 04/22/2012, 12:17 PM Pager: [phone]

## 2012-04-23 DIAGNOSIS — Z5189 Encounter for other specified aftercare: Secondary | ICD-10-CM | POA: Diagnosis not present

## 2012-04-23 DIAGNOSIS — M810 Age-related osteoporosis without current pathological fracture: Secondary | ICD-10-CM | POA: Diagnosis not present

## 2012-04-23 DIAGNOSIS — K5900 Constipation, unspecified: Secondary | ICD-10-CM | POA: Diagnosis not present

## 2012-04-23 DIAGNOSIS — D62 Acute posthemorrhagic anemia: Secondary | ICD-10-CM | POA: Diagnosis not present

## 2012-04-23 DIAGNOSIS — J301 Allergic rhinitis due to pollen: Secondary | ICD-10-CM | POA: Diagnosis not present

## 2012-04-23 DIAGNOSIS — M1990 Unspecified osteoarthritis, unspecified site: Secondary | ICD-10-CM | POA: Diagnosis not present

## 2012-04-23 DIAGNOSIS — M25559 Pain in unspecified hip: Secondary | ICD-10-CM | POA: Diagnosis not present

## 2012-04-23 DIAGNOSIS — M87059 Idiopathic aseptic necrosis of unspecified femur: Secondary | ICD-10-CM | POA: Diagnosis not present

## 2012-04-23 DIAGNOSIS — Z96649 Presence of unspecified artificial hip joint: Secondary | ICD-10-CM | POA: Diagnosis not present

## 2012-04-23 DIAGNOSIS — R42 Dizziness and giddiness: Secondary | ICD-10-CM | POA: Diagnosis not present

## 2012-04-23 DIAGNOSIS — Z9889 Other specified postprocedural states: Secondary | ICD-10-CM | POA: Diagnosis not present

## 2012-04-23 LAB — CBC
MCH: 29.1 pg (ref 26.0–34.0)
MCHC: 32.7 g/dL (ref 30.0–36.0)
MCV: 88.9 fL (ref 78.0–100.0)
Platelets: 179 10*3/uL (ref 150–400)
RBC: 2.44 MIL/uL — ABNORMAL LOW (ref 3.87–5.11)

## 2012-04-23 MED ORDER — ONDANSETRON HCL 4 MG PO TABS: 4.0000 mg | ORAL_TABLET | Freq: Four times a day (QID) | ORAL | Status: DC | PRN

## 2012-04-23 MED ORDER — BISACODYL 10 MG RE SUPP: 10.0000 mg | Freq: Every day | RECTAL | Status: DC | PRN

## 2012-04-23 MED ORDER — POLYSACCHARIDE IRON COMPLEX 150 MG PO CAPS: 150.0000 mg | ORAL_CAPSULE | Freq: Two times a day (BID) | ORAL | Status: DC

## 2012-04-23 MED ORDER — RIVAROXABAN 10 MG PO TABS: 10.0000 mg | ORAL_TABLET | Freq: Every day | ORAL | Status: DC

## 2012-04-23 MED ORDER — OXYCODONE HCL 5 MG PO TABS: 5.0000 mg | ORAL_TABLET | ORAL | Status: DC | PRN

## 2012-04-23 MED ORDER — DSS 100 MG PO CAPS: 100.0000 mg | ORAL_CAPSULE | Freq: Two times a day (BID) | ORAL | Status: DC

## 2012-04-23 MED ORDER — TRAMADOL HCL 50 MG PO TABS: 50.0000 mg | ORAL_TABLET | Freq: Four times a day (QID) | ORAL | Status: DC | PRN

## 2012-04-23 MED ORDER — ACETAMINOPHEN 325 MG PO TABS
650.0000 mg | ORAL_TABLET | Freq: Once | ORAL | Status: AC
  Administered 2012-04-23: 650 mg via ORAL
  Filled 2012-04-23: qty 2

## 2012-04-23 MED ORDER — DIPHENHYDRAMINE HCL 25 MG PO CAPS
25.0000 mg | ORAL_CAPSULE | Freq: Once | ORAL | Status: AC
  Administered 2012-04-23: 25 mg via ORAL
  Filled 2012-04-23: qty 1

## 2012-04-23 MED ORDER — POLYETHYLENE GLYCOL 3350 17 G PO PACK: 17.0000 g | PACK | Freq: Every day | ORAL | Status: DC | PRN

## 2012-04-23 MED ORDER — METHOCARBAMOL 500 MG PO TABS: 500.0000 mg | ORAL_TABLET | Freq: Four times a day (QID) | ORAL | Status: DC | PRN

## 2012-04-23 NOTE — Discharge Instructions (Signed)
Dr. Gaynelle Arabian Total Joint Specialist [facility] [address], [place], [place] [address] [phone]   TOTAL HIP REPLACEMENT POSTOPERATIVE DIRECTIONS    Hip Rehabilitation, Guidelines Following Surgery  The results of a hip operation are greatly improved after range of motion and muscle strengthening exercises. Follow all safety measures which are given to protect your hip. If any of these exercises cause increased pain or swelling in your joint, decrease the amount until you are comfortable again. Then slowly increase the exercises. Call your caregiver if you have problems or questions.  HOME CARE INSTRUCTIONS  Most of the following instructions are designed to prevent the dislocation of your new hip.  Remove items at home which could result in a fall. This includes throw rugs or furniture in walking pathways.  Continue medications as instructed at time of discharge.  You may have some home medications which will be placed on hold until you complete the course of blood thinner medication.  You may start showering once you are discharged home but do not submerge the incision under water. Just pat the incision dry and apply a dry gauze dressing on daily. Do not put on socks or shoes without following the instructions of your caregivers.  Sit on high chairs so your hips are not bent more than 90 degrees.  Sit on chairs with arms. Use the chair arms to help push yourself up when arising.  Keep your leg on the side of the operation out in front of you when standing up.  Arrange for the use of a toilet seat elevator so you are not sitting low.  Do not do any exercises or get in any positions that cause your toes to point in (pigeon toed).  Always sleep with a pillow between your legs. Do not lie on your side in sleep with both knees touching the bed.   Walk with walker as instructed.  You may resume a sexual relationship in one month or when given the OK by  your caregiver.  Use walker as long as suggested by your caregivers.  You may put full weight on your legs and walk as much as is comfortable. Avoid periods of inactivity such as sitting longer than an hour when not asleep. This helps prevent blood clots.  You may return to work once you are cleared by [employer], [employer].  Do not drive a car for 6 weeks or until released by your surgeon.  Do not drive while taking narcotics.  Wear elastic stockings for three weeks following surgery during the day but you may remove then at night.  Make sure you keep all of your appointments after your operation with all of your doctors and caregivers. You should call the office at the above phone number and make an appointment for approximately two weeks after the date of your surgery. Change the dressing daily and reapply a dry dressing each time. Please pick up a stool softener and laxative for home use as long as you are requiring pain medications.  Continue to use ice on the hip for pain and swelling from surgery. You may notice swelling that will progress down to the foot and ankle.  This is normal after  surgery.  Elevate the leg when you are not up walking on it.   It is important for you to complete the blood thinner medication as prescribed by your doctor.  Continue to use the breathing machine which will help keep your temperature down.  It  is common for your temperature to cycle up and down following surgery, especially at night when you are not up moving around and exerting yourself.  The breathing machine keeps your lungs expanded and your temperature down.  RANGE OF MOTION AND STRENGTHENING EXERCISES  These exercises are designed to help you keep full movement of your hip joint. Follow your caregiver's or physical therapist's instructions. Perform all exercises about fifteen times, three times per day or as directed. Exercise both hips, even if you have had only one joint replacement. These exercises can be  done on a training (exercise) mat, on the floor, on a table or on a bed. Use whatever works the best and is most comfortable for you. Use music or television while you are exercising so that the exercises are a pleasant break in your day. This will make your life better with the exercises acting as a break in routine you can look forward to.  Lying on your back, slowly slide your foot toward your buttocks, raising your knee up off the floor. Then slowly slide your foot back down until your leg is straight again.  Lying on your back spread your legs as far apart as you can without causing discomfort.  Lying on your side, raise your upper leg and foot straight up from the floor as far as is comfortable. Slowly lower the leg and repeat.  Lying on your back, tighten up the muscle in the front of your thigh (quadriceps muscles). You can do this by keeping your leg straight and trying to raise your heel off the floor. This helps strengthen the largest muscle supporting your knee.  Lying on your back, tighten up the muscles of your buttocks both with the legs straight and with the knee bent at a comfortable angle while keeping your heel on the floor.   SKILLED REHAB INSTRUCTIONS: If the patient is transferred to a skilled rehab facility following release from the hospital, a list of the current medications will be sent to the facility for the patient to continue.  When discharged from the skilled rehab facility, please have the facility set up the patient's Home Health Physical Therapy prior to being released. Also, the skilled facility will be responsible for providing the patient with their medications at time of release from the facility to include their pain medication, the muscle relaxants, and their blood thinner medication. If the patient is still at the rehab facility at time of the two week follow up appointment, the skilled rehab facility will also need to assist the patient in arranging follow up  appointment in our office and any transportation needs.  MAKE SURE YOU:  Understand these instructions.  Will watch your condition.  Will get help right away if you are not doing well or get worse.   Pick up stool softner and laxative for home. Do not submerge incision under water. May shower. Continue to use ice for pain and swelling from surgery. Hip precautions.  Total Hip Protocol.  Take Xarelto for two and a half more weeks, then discontinue Xarelto. Once the patient has completed the Xarelto, they may resume the 81 mg Aspirin.  When discharged from the skilled rehab facility, please have the facility set up the patient's Home Health Physical Therapy prior to being released.  Also provide the patient with their medications at time of release from the facility to include their pain medication, the muscle relaxants, and their blood thinner medication.  If the patient is still at  the rehab facility at time of follow up appointment, please also assist the patient in arranging follow up appointment in our office and any transportation needs.

## 2012-04-23 NOTE — Progress Notes (Signed)
Pt' iv occluded and refused to have another iv placed at this time.

## 2012-04-23 NOTE — Progress Notes (Signed)
   Subjective: 3 Days Post-Op Procedure(s) (LRB): TOTAL HIP REVISION (Right) Patient reports pain as mild.   Patient seen in rounds by Dr. Lequita Halt. HGB down to 7.1.  Will give two units of blood today and then transfer to [facility] place following transfusion,. Patient is well, and has had no acute complaints or problems Patient is ready to go [facility] later today after blood.  Objective: Vital signs in last 24 hours: Temp:  [98.2 F (36.8 C)-98.8 F (37.1 C)] 98.5 F (36.9 C) (11/07 0452) Pulse Rate:  [79-81] 79  (11/07 0452) Resp:  [16] 16  (11/07 0452) BP: (91-95)/(51-59) 91/51 mmHg (11/07 0452) SpO2:  [98 %-100 %] 98 % (11/07 0452)  Intake/Output from previous day:  Intake/Output Summary (Last 24 hours) at 04/23/12 0802 Last data filed at 04/23/12 0700  Gross per 24 hour  Intake   1080 ml  Output    800 ml  Net    280 ml     Labs:  Basename 04/23/12 0435 04/22/12 0425 04/21/12 0445  HGB 7.1* 8.1* 9.3*    Basename 04/23/12 0435 04/22/12 0425  WBC 6.8 7.0  RBC 2.44* 2.75*  HCT 21.7* 24.5*  PLT 179 162    Basename 04/22/12 0425 04/21/12 0445  NA 136 134*  K 3.7 3.9  CL 104 100  CO2 25 28  BUN 9 10  CREATININE 0.54 0.64  GLUCOSE 159* 133*  CALCIUM 8.2* 8.2*   No results found for this basename: LABPT:2,INR:2 in the last 72 hours  EXAM: General - Patient is Alert, Appropriate and Oriented Extremity - Neurovascular intact Sensation intact distally Dorsiflexion/Plantar flexion intact No cellulitis present Incision - clean, dry, no drainage, healing Motor Function - intact, moving foot and toes well on exam.   Assessment/Plan: 3 Days Post-Op Procedure(s) (LRB): TOTAL HIP REVISION (Right) Procedure(s) (LRB): TOTAL HIP REVISION (Right) Past Medical History  Diagnosis Date  . Vertigo   . Arthritis     OA AND PAIN LEFT KNEE AND PAIN IN SHOULDERS, HANDS, LOWER BACK  . Osteoporosis   . Seasonal allergies    Principal Problem:  *Avascular necrosis  of bone of hip Active Problems:  Postop Acute blood loss anemia  Postop Transfusion   Postop Hyponatremia  Estimated Body mass index is 19.42 kg/(m^2) as calculated from the following:   Height as of this encounter: 5\' 7" (1.702 m).   Weight as of this encounter: 124 lb(56.246 kg). Discharge to SNF Diet - Regular diet Follow up - in 2 weeks Activity - WBAT Disposition - Skilled nursing facility Condition Upon Discharge - Good D/C Meds - See DC Summary DVT Prophylaxis - Xarelto  PERKINS, ALEXZANDREW 04/23/2012, 8:02 AM

## 2012-04-23 NOTE — Discharge Summary (Signed)
Physician Discharge Summary   Patient ID: Marie Coleman MRN: [id number] DOB/AGE: 1941-03-25 71 y.o.  Admit date: 04/20/2012 Discharge date: 04/23/2012  Primary Diagnosis: Avascular necrosis, right femoral head.   Admission Diagnoses:  Past Medical History  Diagnosis Date  . Vertigo   . Arthritis     OA AND PAIN LEFT KNEE AND PAIN IN SHOULDERS, HANDS, LOWER BACK  . Osteoporosis   . Seasonal allergies    Discharge Diagnoses:   Principal Problem:  *Avascular necrosis of bone of hip Active Problems:  Postop Acute blood loss anemia  Postop Transfusion   Postop Hyponatremia  Estimated Body mass index is 19.42 kg/(m^2) as calculated from the following:   Height as of this encounter: 5\' 7" (1.702 m).   Weight as of this encounter: 124 lb(56.246 kg).  Classification of overweight in adults according to BMI (WHO, 1998)   Procedure: Procedure(s) (LRB): TOTAL HIP REVISION (Right)   Consults: None  HPI: Marie Coleman is a 71 year old female who had a right  hip pinning in approximately a year ago and was doing well initially,  but then has had progressively worsening groin pain and dysfunction. CT  scan showed that she had avascular necrosis with an area of collapse of  the femoral head. She has not responded to usual nonoperative  management and presents now for conversion of this hip surgery to a  total hip arthroplasty.  Laboratory Data: Admission on 04/20/2012  Component Date Value Range Status  . ABO/RH(D) 04/20/2012 O POS   Final  . Antibody Screen 04/20/2012 POS   Final  . Sample Expiration 04/20/2012 04/23/2012   Final  . Antibody Identification 04/20/2012 ANTI-E   Final  . PT AG Type 04/20/2012 NEGATIVE FOR E ANTIGEN   Final  . DAT, IgG 04/20/2012 NEG   Final  . Unit Number 04/20/2012 [id number]   Final  . Blood Component Type 04/20/2012 RED CELLS,LR   Final  . Unit division 04/20/2012 00   Final  . Status of Unit 04/20/2012 ALLOCATED   Final  . Donor AG Type  04/20/2012 NEGATIVE FOR E ANTIGEN   Final  . Transfusion Status 04/20/2012 OK TO TRANSFUSE   Final  . Crossmatch Result 04/20/2012 COMPATIBLE   Final  . WBC 04/21/2012 7.7  4.0 - 10.5 K/uL Final  . RBC 04/21/2012 3.20* 3.87 - 5.11 MIL/uL Final  . Hemoglobin 04/21/2012 9.3* 12.0 - 15.0 g/dL Final  . HCT 06/19/7251 28.4* 36.0 - 46.0 % Final  . MCV 04/21/2012 88.8  78.0 - 100.0 fL Final  . MCH 04/21/2012 29.1  26.0 - 34.0 pg Final  . MCHC 04/21/2012 32.7  30.0 - 36.0 g/dL Final  . RDW 66/44/0347 13.4  11.5 - 15.5 % Final  . Platelets 04/21/2012 180  150 - 400 K/uL Final  . Sodium 04/21/2012 134* 135 - 145 mEq/L Final  . Potassium 04/21/2012 3.9  3.5 - 5.1 mEq/L Final  . Chloride 04/21/2012 100  96 - 112 mEq/L Final  . CO2 04/21/2012 28  19 - 32 mEq/L Final  . Glucose, Bld 04/21/2012 133* 70 - 99 mg/dL Final  . BUN 42/59/5638 10  6 - 23 mg/dL Final  . Creatinine, Ser 04/21/2012 0.64  0.50 - 1.10 mg/dL Final  . Calcium 75/64/3329 8.2* 8.4 - 10.5 mg/dL Final  . GFR calc non Af Amer 04/21/2012 88* >90 mL/min Final  . GFR calc Af Amer 04/21/2012 >90  >90 mL/min Final   Comment:  The eGFR has been calculated                          using the CKD EPI equation.                          This calculation has not been                          validated in all clinical                          situations.                          eGFR's persistently                          <90 mL/min signify                          possible Chronic Kidney Disease.  . WBC 04/22/2012 7.0  4.0 - 10.5 K/uL Final  . RBC 04/22/2012 2.75* 3.87 - 5.11 MIL/uL Final  . Hemoglobin 04/22/2012 8.1* 12.0 - 15.0 g/dL Final  . HCT 16/03/9603 24.5* 36.0 - 46.0 % Final  . MCV 04/22/2012 89.1  78.0 - 100.0 fL Final  . MCH 04/22/2012 29.5  26.0 - 34.0 pg Final  . MCHC 04/22/2012 33.1  30.0 - 36.0 g/dL Final  . RDW 54/02/8118 13.5  11.5 - 15.5 % Final  . Platelets 04/22/2012 162  150 - 400 K/uL  Final  . Sodium 04/22/2012 136  135 - 145 mEq/L Final  . Potassium 04/22/2012 3.7  3.5 - 5.1 mEq/L Final  . Chloride 04/22/2012 104  96 - 112 mEq/L Final  . CO2 04/22/2012 25  19 - 32 mEq/L Final  . Glucose, Bld 04/22/2012 159* 70 - 99 mg/dL Final  . BUN 14/78/2956 9  6 - 23 mg/dL Final  . Creatinine, Ser 04/22/2012 0.54  0.50 - 1.10 mg/dL Final  . Calcium 21/30/8657 8.2* 8.4 - 10.5 mg/dL Final  . GFR calc non Af Amer 04/22/2012 >90  >90 mL/min Final  . GFR calc Af Amer 04/22/2012 >90  >90 mL/min Final   Comment:                                 The eGFR has been calculated                          using the CKD EPI equation.                          This calculation has not been                          validated in all clinical                          situations.                          eGFR's persistently                          <  90 mL/min signify                          possible Chronic Kidney Disease.  . WBC 04/23/2012 6.8  4.0 - 10.5 K/uL Final  . RBC 04/23/2012 2.44* 3.87 - 5.11 MIL/uL Final  . Hemoglobin 04/23/2012 7.1* 12.0 - 15.0 g/dL Final  . HCT 78/29/5621 21.7* 36.0 - 46.0 % Final  . MCV 04/23/2012 88.9  78.0 - 100.0 fL Final  . MCH 04/23/2012 29.1  26.0 - 34.0 pg Final  . MCHC 04/23/2012 32.7  30.0 - 36.0 g/dL Final  . RDW 30/86/5784 13.6  11.5 - 15.5 % Final  . Platelets 04/23/2012 179  150 - 400 K/uL Final  . Order Confirmation 04/23/2012 ORDER PROCESSED BY BLOOD BANK   Final  Hospital Outpatient Visit on 04/14/2012  Component Date Value Range Status  . aPTT 04/14/2012 33  24 - 37 seconds Final  . WBC 04/14/2012 4.5  4.0 - 10.5 K/uL Final  . RBC 04/14/2012 4.59  3.87 - 5.11 MIL/uL Final  . Hemoglobin 04/14/2012 13.4  12.0 - 15.0 g/dL Final  . HCT 69/62/9528 40.8  36.0 - 46.0 % Final  . MCV 04/14/2012 88.9  78.0 - 100.0 fL Final  . MCH 04/14/2012 29.2  26.0 - 34.0 pg Final  . MCHC 04/14/2012 32.8  30.0 - 36.0 g/dL Final  . RDW 41/32/4401 13.5  11.5 - 15.5 %  Final  . Platelets 04/14/2012 290  150 - 400 K/uL Final  . Sodium 04/14/2012 137  135 - 145 mEq/L Final  . Potassium 04/14/2012 4.1  3.5 - 5.1 mEq/L Final  . Chloride 04/14/2012 99  96 - 112 mEq/L Final  . CO2 04/14/2012 30  19 - 32 mEq/L Final  . Glucose, Bld 04/14/2012 85  70 - 99 mg/dL Final  . BUN 02/72/5366 21  6 - 23 mg/dL Final  . Creatinine, Ser 04/14/2012 0.66  0.50 - 1.10 mg/dL Final  . Calcium 44/08/4740 9.5  8.4 - 10.5 mg/dL Final  . Total Protein 04/14/2012 7.9  6.0 - 8.3 g/dL Final  . Albumin 59/56/3875 3.8  3.5 - 5.2 g/dL Final  . AST 64/33/2951 18  0 - 37 U/L Final  . ALT 04/14/2012 7  0 - 35 U/L Final  . Alkaline Phosphatase 04/14/2012 54  39 - 117 U/L Final  . Total Bilirubin 04/14/2012 0.5  0.3 - 1.2 mg/dL Final  . GFR calc non Af Amer 04/14/2012 87* >90 mL/min Final  . GFR calc Af Amer 04/14/2012 >90  >90 mL/min Final   Comment:                                 The eGFR has been calculated                          using the CKD EPI equation.                          This calculation has not been                          validated in all clinical  situations.                          eGFR's persistently                          <90 mL/min signify                          possible Chronic Kidney Disease.  Marland Kitchen Prothrombin Time 04/14/2012 12.7  11.6 - 15.2 seconds Final  . INR 04/14/2012 0.96  0.00 - 1.49 Final  . Color, Urine 04/14/2012 YELLOW  YELLOW Final  . APPearance 04/14/2012 CLOUDY* CLEAR Final  . Specific Gravity, Urine 04/14/2012 1.019  1.005 - 1.030 Final  . pH 04/14/2012 6.5  5.0 - 8.0 Final  . Glucose, UA 04/14/2012 NEGATIVE  NEGATIVE mg/dL Final  . Hgb urine dipstick 04/14/2012 TRACE* NEGATIVE Final  . Bilirubin Urine 04/14/2012 NEGATIVE  NEGATIVE Final  . Ketones, ur 04/14/2012 NEGATIVE  NEGATIVE mg/dL Final  . Protein, ur 16/03/9603 NEGATIVE  NEGATIVE mg/dL Final  . Urobilinogen, UA 04/14/2012 0.2  0.0 - 1.0 mg/dL Final  .  Nitrite 54/02/8118 NEGATIVE  NEGATIVE Final  . Leukocytes, UA 04/14/2012 LARGE* NEGATIVE Final  . MRSA, PCR 04/14/2012 NEGATIVE  NEGATIVE Final  . Staphylococcus aureus 04/14/2012 NEGATIVE  NEGATIVE Final   Comment:                                 The Xpert SA Assay (FDA                          approved for NASAL specimens                          in patients over 47 years of age),                          is one component of                          a comprehensive surveillance                          program.  Test performance has                          been validated by [employer] for patients greater                          than or equal to 44 year old.                          It is not intended                          to diagnose infection nor to  guide or monitor treatment.  . Squamous Epithelial / LPF 04/14/2012 FEW* RARE Final  . WBC, UA 04/14/2012 11-20  <3 WBC/hpf Final  . RBC / HPF 04/14/2012 0-2  <3 RBC/hpf Final  . Bacteria, UA 04/14/2012 FEW* RARE Final  . Specimen Description 04/14/2012 URINE, CLEAN CATCH   Final  . Special Requests 04/14/2012 NONE   Final  . Culture  Setup Time 04/14/2012 04/14/2012 13:46   Final  . Colony Count 04/14/2012 25,000 COLONIES/ML   Final  . Culture 04/14/2012 Multiple bacterial morphotypes present, none predominant. Suggest appropriate recollection if clinically indicated.   Final  . Report Status 04/14/2012 04/15/2012 FINAL   Final     X-Rays:Dg Hip Complete Right  04/14/2012  *RADIOLOGY REPORT*  Clinical Data: Right total hip replacement  RIGHT HIP - COMPLETE 2+ VIEW  Comparison: CT hip dated 03/20/2012  Findings: Three cancellous screws transfixing a healed right femoral neck fracture.  Deformity of the right femoral head, corresponding to avascular necrosis on the recent CT.  Right hip joint is mildly narrowed.  Left hip joint is preserved.  Degenerate changes of the lower lumbar  spine  IMPRESSION: Healed right femoral neck fracture status post ORIF.  Deformity of the right femoral head, related to avascular necrosis.   Original Report Authenticated By: Charline Bills, M.D.    Dg Pelvis Portable  04/20/2012  *RADIOLOGY REPORT*  Clinical Data: Status post right hip replacement  PORTABLE PELVIS  Comparison: 03/20/2012  Findings: There is now evidence of a total hip prosthesis.  The previously seen screws have been removed. Air is noted within the surgical bed as well as a surgical drain.  No acute bony abnormality is noted.  Degenerative change in the lumbar spine is seen.  IMPRESSION: Status post right hip replacement without acute abnormality.   Original Report Authenticated By: Alcide Clever, M.D.    US Aorta  04/06/2012  *RADIOLOGY REPORT*  Clinical Data:  Palpable pulsatile abdominal mass.  ULTRASOUND OF ABDOMINAL AORTA  Technique:  Ultrasound examination of the abdominal aorta was performed to evaluate for abdominal aortic aneurysm.  Comparison: None.  Abdominal Aorta:  No aneurysm identified.        Maximum AP diameter:  2.7 cm.       Maximum TRV diameter:  2.7 cm.  IMPRESSION: No abdominal aortic aneurysm identified.   Original Report Authenticated By: Reola Calkins, M.D.    Dg Hip Portable 1 View Right  04/20/2012  *RADIOLOGY REPORT*  Clinical Data: Status post right hip replacement  PORTABLE RIGHT HIP - 1 VIEW  Comparison: None.  Findings: There are changes consistent with a total right hip replacement.  No acute bony abnormality is noted.  Air and a surgical drain are noted in the surgical bed.  IMPRESSION: Postoperative change without acute abnormality.   Original Report Authenticated By: Alcide Clever, M.D.     EKG: Orders placed during the hospital encounter of 05/14/11  . EKG     Hospital Course:  Patient was admitted to [facility] and taken to the OR and underwent the above state procedure without complications.  Patient tolerated the procedure  well and was later transferred to the recovery room and then to the orthopaedic floor for postoperative care.  They were given PO and IV analgesics for pain control following their surgery.  They were given 24 hours of postoperative antibiotics of  Anti-infectives     Start     Dose/Rate Route Frequency Ordered Stop   04/20/12 2100  ceFAZolin (ANCEF) IVPB 1 g/50 mL premix        1 g 100 mL/hr over 30 Minutes Intravenous Every 6 hours 04/20/12 1659 04/21/12 0253   04/20/12 1143   ceFAZolin (ANCEF) IVPB 2 g/50 mL premix        2 g 100 mL/hr over 30 Minutes Intravenous 60 min pre-op 04/20/12 1143 04/20/12 1409         and started on DVT prophylaxis in the form of Xarelto.   PT and OT were ordered for total hip protocol.  The patient was allowed to be WBAT with therapy. Discharge planning was consulted to help with postop disposition and equipment needs.  Patient had a tough night on the evening of surgery because her throat was sore form the tube. She started to get up OOB with therapy on day one.  Hemovac drain was pulled without difficulty.  The knee immobilizer was removed and discontinued.  Continued to work with therapy into day two and did better that day walking over 80 feet then 120 feet. She did well despite having a lower HGB on day 2 down to 8.1 from 9.3  Dressing was changed on day two and the incision was healing well.  By day three, the HGB was noted to be even lower down to 7.1.  She was seen by Dr. Lequita Halt who recommended blood.  She was given two units and then setup for transfer to [facility] later that day.  Incision was healing well.  Patient was seen in rounds and was ready to go to the SNF later that day after the blood transfusion.   Discharge Medications: Prior to Admission medications   Medication Sig Start Date End Date Taking? Authorizing Provider  acetaminophen (TYLENOL) 325 MG tablet Take 2 tablets (650 mg total) by mouth every 6 (six) hours as needed (or Fever >/=  101). 09/26/11 09/25/12 Yes Journey Ratterman Julien Girt, PA  fexofenadine (ALLEGRA) 180 MG tablet Take 180 mg by mouth daily as needed. For allergies   Yes Historical Provider, MD  sodium chloride (OCEAN) 0.65 % nasal spray Place 1 spray into the nose as needed. Allergies   Yes Historical Provider, MD  bisacodyl (DULCOLAX) 10 MG suppository Place 1 suppository (10 mg total) rectally daily as needed. 04/23/12   Anjuli Gemmill, PA  docusate sodium 100 MG CAPS Take 100 mg by mouth 2 (two) times daily. 04/23/12   Lyndi Holbein Julien Girt, PA  iron polysaccharides (NIFEREX) 150 MG capsule Take 1 capsule (150 mg total) by mouth 2 (two) times daily. 04/23/12   Montana Fassnacht Julien Girt, PA  methocarbamol (ROBAXIN) 500 MG tablet Take 1 tablet (500 mg total) by mouth every 6 (six) hours as needed. 04/23/12   Jhonathan Desroches, PA  ondansetron (ZOFRAN) 4 MG tablet Take 1 tablet (4 mg total) by mouth every 6 (six) hours as needed for nausea. 04/23/12   Willett Lefeber, PA  oxyCODONE (OXY IR/ROXICODONE) 5 MG immediate release tablet Take 1-2 tablets (5-10 mg total) by mouth every 3 (three) hours as needed. 04/23/12   Takeysha Bonk, PA  polyethylene glycol (MIRALAX / GLYCOLAX) packet Take 17 g by mouth daily as needed. 04/23/12   Powell Halbert Julien Girt, PA  rivaroxaban (XARELTO) 10 MG TABS tablet Take 1 tablet (10 mg total) by mouth daily with breakfast. Take Xarelto for two and a half more weeks, then discontinue Xarelto. Once the patient has completed the Xarelto, they may resume the 81 mg Aspirin. 04/23/12   Kameran Mcneese Julien Girt, PA  traMADol (ULTRAM) 50 MG  tablet Take 1-2 tablets (50-100 mg total) by mouth every 6 (six) hours as needed (mild pain). 04/23/12   Amilyah Nack Julien Girt, PA     Diet: Regular diet Activity:WBAT No bending hip over 90 degrees- A "L" Angle Do not cross legs Do not let foot roll inward When turning these patients a pillow should be placed between the patient's legs to prevent crossing. Patients  should have the affected knee fully extended when trying to sit or stand from all surfaces to prevent excessive hip flexion. When ambulating and turning toward the affected side the affected leg should have the toes turned out prior to moving the walker and the rest of patient's body as to prevent internal rotation/ turning in of the leg. Abduction pillows are the most effective way to prevent a patient from not crossing legs or turning toes in at rest. If an abduction pillow is not ordered placing a regular pillow length wise between the patient's legs is also an effective reminder. It is imperative that these precautions be maintained so that the surgical hip does not dislocate. Follow-up:in 2 weeks Disposition - Skilled nursing facility - [facility] Discharged Condition: good   Discharge Orders    Future Orders Please Complete By Expires   Diet general      Call MD / Call 911      Comments:   If you experience chest pain or shortness of breath, CALL 911 and be transported to the hospital emergency room.  If you develope a fever above 101 F, pus (white drainage) or increased drainage or redness at the wound, or calf pain, call your surgeon's office.   Discharge instructions      Comments:   Pick up stool softner and laxative for home. Do not submerge incision under water. May shower. Continue to use ice for pain and swelling from surgery. Hip precautions.  Total Hip Protocol.  Take Xarelto for two and a half more weeks, then discontinue Xarelto. Once the patient has completed the Xarelto, they may resume the 81 mg Aspirin.  When discharged from the skilled rehab facility, please have the facility set up the patient's Home Health Physical Therapy prior to being released.  Also provide the patient with their medications at time of release from the facility to include their pain medication, the muscle relaxants, and their blood thinner medication.  If the patient is still at the rehab  facility at time of follow up appointment, please also assist the patient in arranging follow up appointment in our office and any transportation needs.   Constipation Prevention      Comments:   Drink plenty of fluids.  Prune juice may be helpful.  You may use a stool softener, such as Colace (over the counter) 100 mg twice a day.  Use MiraLax (over the counter) for constipation as needed.   Increase activity slowly as tolerated      Patient may shower      Comments:   You may shower without a dressing once there is no drainage.  Do not wash over the wound.  If drainage remains, do not shower until drainage stops.   Weight bearing as tolerated      Driving restrictions      Comments:   No driving until released by the physician.   Lifting restrictions      Comments:   No lifting until released by the physician.   Follow the hip precautions as taught in Physical Therapy  Change dressing      Comments:   You may change your dressing dressing daily with sterile 4 x 4 inch gauze dressing and paper tape.  Do not submerge the incision under water.   TED hose      Comments:   Use stockings (TED hose) for 3 weeks on both leg(s).  You may remove them at night for sleeping.   Do not sit on low chairs, stoools or toilet seats, as it may be difficult to get up from low surfaces          Medication List     As of 04/23/2012  8:11 AM    STOP taking these medications         aspirin EC 81 MG tablet      calcium citrate-vitamin D 200-200 MG-UNIT Tabs      cholecalciferol 1000 UNITS tablet   Commonly known as: VITAMIN D      estradiol 0.075 MG/24HR   Commonly known as: VIVELLE-DOT      fish oil-omega-3 fatty acids 1000 MG capsule      TAKE these medications         acetaminophen 325 MG tablet   Commonly known as: TYLENOL   Take 2 tablets (650 mg total) by mouth every 6 (six) hours as needed (or Fever >/= 101).      bisacodyl 10 MG suppository   Commonly known as: DULCOLAX    Place 1 suppository (10 mg total) rectally daily as needed.      DSS 100 MG Caps   Take 100 mg by mouth 2 (two) times daily.      fexofenadine 180 MG tablet   Commonly known as: ALLEGRA   Take 180 mg by mouth daily as needed. For allergies      iron polysaccharides 150 MG capsule   Commonly known as: NIFEREX   Take 1 capsule (150 mg total) by mouth 2 (two) times daily.      methocarbamol 500 MG tablet   Commonly known as: ROBAXIN   Take 1 tablet (500 mg total) by mouth every 6 (six) hours as needed.      ondansetron 4 MG tablet   Commonly known as: ZOFRAN   Take 1 tablet (4 mg total) by mouth every 6 (six) hours as needed for nausea.      oxyCODONE 5 MG immediate release tablet   Commonly known as: Oxy IR/ROXICODONE   Take 1-2 tablets (5-10 mg total) by mouth every 3 (three) hours as needed.      polyethylene glycol packet   Commonly known as: MIRALAX / GLYCOLAX   Take 17 g by mouth daily as needed.      rivaroxaban 10 MG Tabs tablet   Commonly known as: XARELTO   Take 1 tablet (10 mg total) by mouth daily with breakfast. Take Xarelto for two and a half more weeks, then discontinue Xarelto.  Once the patient has completed the Xarelto, they may resume the 81 mg Aspirin.      sodium chloride 0.65 % nasal spray   Commonly known as: OCEAN   Place 1 spray into the nose as needed. Allergies      traMADol 50 MG tablet   Commonly known as: ULTRAM   Take 1-2 tablets (50-100 mg total) by mouth every 6 (six) hours as needed (mild pain).           Follow-up Information    Follow up with Loanne Drilling, MD. Schedule an appointment  as soon as possible for a visit in 2 weeks.   Contact information:   [address], [address] [place] [address] [phone]          Signed: Patrica Duel 04/23/2012, 8:11 AM

## 2012-04-23 NOTE — Progress Notes (Signed)
Physical Therapy Treatment Patient Details Name: Marie Coleman MRN: [id number] DOB: 1940-09-27 Today's Date: 04/23/2012 Time: 4696-2952 PT Time Calculation (min): 24 min  PT Assessment / Plan / Recommendation Comments on Treatment Session  Pt s/p 1 unit of blood.  Pt able to tolerate ambulation and exercises.  No dizziness with mobility.  Pt to d/c to SNF later today after 2nd unit of blood.    Follow Up Recommendations  SNF     Does the patient have the potential to tolerate intense rehabilitation     Barriers to Discharge        Equipment Recommendations  None recommended by OT;None recommended by PT    Recommendations for Other Services    Frequency     Plan Discharge plan remains appropriate;Frequency remains appropriate    Precautions / Restrictions Precautions Precautions: Fall;Posterior Hip Restrictions RLE Weight Bearing: Weight bearing as tolerated   Pertinent Vitals/Pain Pt continues to report no pain, just sore R hip.    Mobility  Bed Mobility Bed Mobility: Supine to Sit Supine to Sit: 5: Supervision Details for Bed Mobility Assistance: increased time Transfers Transfers: Sit to Stand;Stand to Sit Sit to Stand: 5: Supervision;With upper extremity assist;From bed Stand to Sit: 5: Supervision;With upper extremity assist;To chair/3-in-1 Details for Transfer Assistance: pt able to recall safe technique Ambulation/Gait Ambulation/Gait Assistance: 4: Min guard Ambulation Distance (Feet): 120 Feet Assistive device: Rolling walker Ambulation/Gait Assistance Details: pt doing well with RW, no dizziness Gait Pattern: Step-to pattern;Antalgic Gait velocity: decreased    Exercises Total Joint Exercises Ankle Circles/Pumps: AROM;Both;15 reps Quad Sets: AROM;Both;20 reps Gluteal Sets: AROM;Both;20 reps Towel Squeeze: AROM;Both;20 reps Short Arc Quad: AROM;Right;10 reps Heel Slides: Right;15 reps;AROM Hip ABduction/ADduction: AROM;Right;15 reps Straight Leg Raises:  AAROM;Right;10 reps Long Arc Quad: 10 reps;Right;AROM   PT Diagnosis:    PT Problem List:   PT Treatment Interventions:     PT Goals Acute Rehab PT Goals PT Goal: Sit to Stand - Progress: Met PT Goal: Stand to Sit - Progress: Met PT Goal: Ambulate - Progress: Progressing toward goal PT Goal: Perform Home Exercise Program - Progress: Progressing toward goal  Visit Information  Last PT Received On: 04/23/12 Assistance Needed: +1    Subjective Data  Subjective: I thought I say you in the hallway.   Cognition  Overall Cognitive Status: Appears within functional limits for tasks assessed/performed    Balance     End of Session PT - End of Session Activity Tolerance: Patient tolerated treatment well Patient left: in bed;with call bell/phone within reach;with family/visitor present   GP     Marie Coleman,KATHrine E 04/23/2012, 12:56 PM Pager: [phone]

## 2012-04-23 NOTE — Progress Notes (Signed)
Pt to be d/c to [facility] today via [facility] transport. Pt is aware in in agreement with d/c plan.   Cori Razor LCSW [phone]

## 2012-04-24 DIAGNOSIS — M87059 Idiopathic aseptic necrosis of unspecified femur: Secondary | ICD-10-CM | POA: Diagnosis not present

## 2012-04-24 DIAGNOSIS — J301 Allergic rhinitis due to pollen: Secondary | ICD-10-CM | POA: Diagnosis not present

## 2012-04-24 DIAGNOSIS — K5900 Constipation, unspecified: Secondary | ICD-10-CM | POA: Diagnosis not present

## 2012-04-24 DIAGNOSIS — D62 Acute posthemorrhagic anemia: Secondary | ICD-10-CM | POA: Diagnosis not present

## 2012-04-24 LAB — TYPE AND SCREEN
ABO/RH(D): O POS
Antibody Screen: POSITIVE
DAT, IgG: NEGATIVE
Donor AG Type: NEGATIVE
Unit division: 0

## 2012-04-24 NOTE — Progress Notes (Signed)
Clinical Social Work Department CLINICAL SOCIAL WORK PLACEMENT NOTE 04/24/2012  Patient:  Marie Coleman, Marie Coleman  Account Number:  [id number] Admit date:  04/20/2012  Clinical Social Worker:  Cori Razor, LCSW  Date/time:  04/21/2012 11:36 AM  Clinical Social Work is seeking post-discharge placement for this patient at the following level of care:   SKILLED NURSING   (*CSW will update this form in Epic as items are completed)     Patient/family provided with [facility] Department of Clinical Social Work's list of facilities offering this level of care within the geographic area requested by the patient (or if unable, by the patient's family).    Patient/family informed of their freedom to choose among providers that offer the needed level of care, that participate in Medicare, Medicaid or managed care program needed by the patient, have an available bed and are willing to accept the patient.    Patient/family informed of [facility]' ownership interest in [facility], as well as of the fact that they are under no obligation to receive care at this facility.  PASARR submitted to EDS on  PASARR number received from EDS on 05/16/2011  FL2 transmitted to all facilities in geographic area requested by pt/family on  04/21/2012 FL2 transmitted to all facilities within larger geographic area on   Patient informed that his/her managed care company has contracts with or will negotiate with  certain facilities, including the following:     Patient/family informed of bed offers received:  04/21/2012 Patient chooses bed at [facility] Physician recommends and patient chooses bed at    Patient to be transferred to [facility] on  04/23/2012 Patient to be transferred to facility by [facility]  The following physician request were entered in Epic:   Additional Comments:  Cori Razor LCSW [phone]

## 2012-05-04 DIAGNOSIS — Z96649 Presence of unspecified artificial hip joint: Secondary | ICD-10-CM | POA: Diagnosis not present

## 2012-05-04 DIAGNOSIS — M545 Low back pain, unspecified: Secondary | ICD-10-CM | POA: Diagnosis not present

## 2012-05-04 DIAGNOSIS — R42 Dizziness and giddiness: Secondary | ICD-10-CM | POA: Diagnosis not present

## 2012-05-04 DIAGNOSIS — IMO0002 Reserved for concepts with insufficient information to code with codable children: Secondary | ICD-10-CM | POA: Diagnosis not present

## 2012-05-04 DIAGNOSIS — Z4789 Encounter for other orthopedic aftercare: Secondary | ICD-10-CM | POA: Diagnosis not present

## 2012-05-04 DIAGNOSIS — M1710 Unilateral primary osteoarthritis, unspecified knee: Secondary | ICD-10-CM | POA: Diagnosis not present

## 2012-05-07 DIAGNOSIS — M545 Low back pain, unspecified: Secondary | ICD-10-CM | POA: Diagnosis not present

## 2012-05-07 DIAGNOSIS — Z4789 Encounter for other orthopedic aftercare: Secondary | ICD-10-CM | POA: Diagnosis not present

## 2012-05-07 DIAGNOSIS — IMO0002 Reserved for concepts with insufficient information to code with codable children: Secondary | ICD-10-CM | POA: Diagnosis not present

## 2012-05-07 DIAGNOSIS — R42 Dizziness and giddiness: Secondary | ICD-10-CM | POA: Diagnosis not present

## 2012-05-07 DIAGNOSIS — M1710 Unilateral primary osteoarthritis, unspecified knee: Secondary | ICD-10-CM | POA: Diagnosis not present

## 2012-05-07 DIAGNOSIS — Z96649 Presence of unspecified artificial hip joint: Secondary | ICD-10-CM | POA: Diagnosis not present

## 2012-05-08 DIAGNOSIS — IMO0002 Reserved for concepts with insufficient information to code with codable children: Secondary | ICD-10-CM | POA: Diagnosis not present

## 2012-05-08 DIAGNOSIS — M1710 Unilateral primary osteoarthritis, unspecified knee: Secondary | ICD-10-CM | POA: Diagnosis not present

## 2012-05-08 DIAGNOSIS — M545 Low back pain, unspecified: Secondary | ICD-10-CM | POA: Diagnosis not present

## 2012-05-08 DIAGNOSIS — Z4789 Encounter for other orthopedic aftercare: Secondary | ICD-10-CM | POA: Diagnosis not present

## 2012-05-08 DIAGNOSIS — R42 Dizziness and giddiness: Secondary | ICD-10-CM | POA: Diagnosis not present

## 2012-05-08 DIAGNOSIS — Z96649 Presence of unspecified artificial hip joint: Secondary | ICD-10-CM | POA: Diagnosis not present

## 2012-05-11 DIAGNOSIS — Z4789 Encounter for other orthopedic aftercare: Secondary | ICD-10-CM | POA: Diagnosis not present

## 2012-05-11 DIAGNOSIS — M1710 Unilateral primary osteoarthritis, unspecified knee: Secondary | ICD-10-CM | POA: Diagnosis not present

## 2012-05-11 DIAGNOSIS — IMO0002 Reserved for concepts with insufficient information to code with codable children: Secondary | ICD-10-CM | POA: Diagnosis not present

## 2012-05-11 DIAGNOSIS — M545 Low back pain, unspecified: Secondary | ICD-10-CM | POA: Diagnosis not present

## 2012-05-11 DIAGNOSIS — R42 Dizziness and giddiness: Secondary | ICD-10-CM | POA: Diagnosis not present

## 2012-05-11 DIAGNOSIS — Z96649 Presence of unspecified artificial hip joint: Secondary | ICD-10-CM | POA: Diagnosis not present

## 2012-05-13 DIAGNOSIS — M1710 Unilateral primary osteoarthritis, unspecified knee: Secondary | ICD-10-CM | POA: Diagnosis not present

## 2012-05-13 DIAGNOSIS — Z4789 Encounter for other orthopedic aftercare: Secondary | ICD-10-CM | POA: Diagnosis not present

## 2012-05-13 DIAGNOSIS — IMO0002 Reserved for concepts with insufficient information to code with codable children: Secondary | ICD-10-CM | POA: Diagnosis not present

## 2012-05-13 DIAGNOSIS — M545 Low back pain, unspecified: Secondary | ICD-10-CM | POA: Diagnosis not present

## 2012-05-13 DIAGNOSIS — R42 Dizziness and giddiness: Secondary | ICD-10-CM | POA: Diagnosis not present

## 2012-05-13 DIAGNOSIS — Z96649 Presence of unspecified artificial hip joint: Secondary | ICD-10-CM | POA: Diagnosis not present

## 2012-05-15 DIAGNOSIS — M545 Low back pain, unspecified: Secondary | ICD-10-CM | POA: Diagnosis not present

## 2012-05-15 DIAGNOSIS — Z4789 Encounter for other orthopedic aftercare: Secondary | ICD-10-CM | POA: Diagnosis not present

## 2012-05-15 DIAGNOSIS — Z96649 Presence of unspecified artificial hip joint: Secondary | ICD-10-CM | POA: Diagnosis not present

## 2012-05-15 DIAGNOSIS — R42 Dizziness and giddiness: Secondary | ICD-10-CM | POA: Diagnosis not present

## 2012-05-15 DIAGNOSIS — IMO0002 Reserved for concepts with insufficient information to code with codable children: Secondary | ICD-10-CM | POA: Diagnosis not present

## 2012-05-15 DIAGNOSIS — M1710 Unilateral primary osteoarthritis, unspecified knee: Secondary | ICD-10-CM | POA: Diagnosis not present

## 2012-05-27 DIAGNOSIS — Z96649 Presence of unspecified artificial hip joint: Secondary | ICD-10-CM | POA: Diagnosis not present

## 2012-07-20 DIAGNOSIS — M810 Age-related osteoporosis without current pathological fracture: Secondary | ICD-10-CM | POA: Diagnosis not present

## 2012-07-20 DIAGNOSIS — Z01419 Encounter for gynecological examination (general) (routine) without abnormal findings: Secondary | ICD-10-CM | POA: Diagnosis not present

## 2012-07-20 DIAGNOSIS — E2839 Other primary ovarian failure: Secondary | ICD-10-CM | POA: Diagnosis not present

## 2012-08-04 DIAGNOSIS — S43429A Sprain of unspecified rotator cuff capsule, initial encounter: Secondary | ICD-10-CM | POA: Diagnosis not present

## 2012-08-08 DIAGNOSIS — S43429A Sprain of unspecified rotator cuff capsule, initial encounter: Secondary | ICD-10-CM | POA: Diagnosis not present

## 2012-08-13 DIAGNOSIS — S43429A Sprain of unspecified rotator cuff capsule, initial encounter: Secondary | ICD-10-CM | POA: Diagnosis not present

## 2012-08-15 HISTORY — PX: ROTATOR CUFF REPAIR: SHX139

## 2012-09-07 DIAGNOSIS — S46819A Strain of other muscles, fascia and tendons at shoulder and upper arm level, unspecified arm, initial encounter: Secondary | ICD-10-CM | POA: Diagnosis not present

## 2012-09-07 DIAGNOSIS — S43499A Other sprain of unspecified shoulder joint, initial encounter: Secondary | ICD-10-CM | POA: Diagnosis not present

## 2012-09-07 DIAGNOSIS — M24119 Other articular cartilage disorders, unspecified shoulder: Secondary | ICD-10-CM | POA: Diagnosis not present

## 2012-09-07 DIAGNOSIS — M19019 Primary osteoarthritis, unspecified shoulder: Secondary | ICD-10-CM | POA: Diagnosis not present

## 2012-09-07 DIAGNOSIS — G8918 Other acute postprocedural pain: Secondary | ICD-10-CM | POA: Diagnosis not present

## 2012-09-07 DIAGNOSIS — M75110 Incomplete rotator cuff tear or rupture of unspecified shoulder, not specified as traumatic: Secondary | ICD-10-CM | POA: Diagnosis not present

## 2012-09-07 DIAGNOSIS — M25819 Other specified joint disorders, unspecified shoulder: Secondary | ICD-10-CM | POA: Diagnosis not present

## 2012-09-11 ENCOUNTER — Ambulatory Visit: Payer: Medicare Other | Attending: Orthopedic Surgery | Admitting: Physical Therapy

## 2012-09-11 DIAGNOSIS — M25519 Pain in unspecified shoulder: Secondary | ICD-10-CM | POA: Insufficient documentation

## 2012-09-11 DIAGNOSIS — IMO0001 Reserved for inherently not codable concepts without codable children: Secondary | ICD-10-CM | POA: Insufficient documentation

## 2012-09-18 ENCOUNTER — Ambulatory Visit: Payer: Medicare Other | Attending: Orthopedic Surgery | Admitting: Physical Therapy

## 2012-09-18 DIAGNOSIS — M25519 Pain in unspecified shoulder: Secondary | ICD-10-CM | POA: Insufficient documentation

## 2012-09-18 DIAGNOSIS — IMO0001 Reserved for inherently not codable concepts without codable children: Secondary | ICD-10-CM | POA: Insufficient documentation

## 2012-09-24 ENCOUNTER — Ambulatory Visit: Payer: Medicare Other | Admitting: Physical Therapy

## 2012-09-24 DIAGNOSIS — M25519 Pain in unspecified shoulder: Secondary | ICD-10-CM | POA: Diagnosis not present

## 2012-09-24 DIAGNOSIS — IMO0001 Reserved for inherently not codable concepts without codable children: Secondary | ICD-10-CM | POA: Diagnosis not present

## 2012-09-29 ENCOUNTER — Ambulatory Visit: Payer: Medicare Other | Admitting: Physical Therapy

## 2012-09-29 DIAGNOSIS — IMO0001 Reserved for inherently not codable concepts without codable children: Secondary | ICD-10-CM | POA: Diagnosis not present

## 2012-09-29 DIAGNOSIS — M25519 Pain in unspecified shoulder: Secondary | ICD-10-CM | POA: Diagnosis not present

## 2012-10-06 ENCOUNTER — Ambulatory Visit: Payer: Medicare Other | Admitting: Physical Therapy

## 2012-10-06 DIAGNOSIS — M25519 Pain in unspecified shoulder: Secondary | ICD-10-CM | POA: Diagnosis not present

## 2012-10-06 DIAGNOSIS — IMO0001 Reserved for inherently not codable concepts without codable children: Secondary | ICD-10-CM | POA: Diagnosis not present

## 2012-10-06 DIAGNOSIS — Z4889 Encounter for other specified surgical aftercare: Secondary | ICD-10-CM | POA: Diagnosis not present

## 2012-10-08 ENCOUNTER — Ambulatory Visit: Payer: Medicare Other | Admitting: Physical Therapy

## 2012-10-08 DIAGNOSIS — M25519 Pain in unspecified shoulder: Secondary | ICD-10-CM | POA: Diagnosis not present

## 2012-10-08 DIAGNOSIS — IMO0001 Reserved for inherently not codable concepts without codable children: Secondary | ICD-10-CM | POA: Diagnosis not present

## 2012-10-12 ENCOUNTER — Ambulatory Visit: Payer: Medicare Other | Admitting: Physical Therapy

## 2012-10-12 DIAGNOSIS — M25519 Pain in unspecified shoulder: Secondary | ICD-10-CM | POA: Diagnosis not present

## 2012-10-12 DIAGNOSIS — IMO0001 Reserved for inherently not codable concepts without codable children: Secondary | ICD-10-CM | POA: Diagnosis not present

## 2012-10-15 ENCOUNTER — Ambulatory Visit: Payer: Medicare Other | Admitting: Physical Therapy

## 2012-10-16 ENCOUNTER — Ambulatory Visit: Payer: Medicare Other | Attending: Orthopedic Surgery | Admitting: Physical Therapy

## 2012-10-16 DIAGNOSIS — M25519 Pain in unspecified shoulder: Secondary | ICD-10-CM | POA: Insufficient documentation

## 2012-10-16 DIAGNOSIS — IMO0001 Reserved for inherently not codable concepts without codable children: Secondary | ICD-10-CM | POA: Insufficient documentation

## 2012-10-20 ENCOUNTER — Ambulatory Visit: Payer: Medicare Other | Admitting: Physical Therapy

## 2012-10-20 DIAGNOSIS — M25519 Pain in unspecified shoulder: Secondary | ICD-10-CM | POA: Diagnosis not present

## 2012-10-20 DIAGNOSIS — IMO0001 Reserved for inherently not codable concepts without codable children: Secondary | ICD-10-CM | POA: Diagnosis not present

## 2012-10-22 ENCOUNTER — Ambulatory Visit: Payer: Medicare Other | Admitting: Physical Therapy

## 2012-10-22 DIAGNOSIS — M25519 Pain in unspecified shoulder: Secondary | ICD-10-CM | POA: Diagnosis not present

## 2012-10-22 DIAGNOSIS — IMO0001 Reserved for inherently not codable concepts without codable children: Secondary | ICD-10-CM | POA: Diagnosis not present

## 2012-10-27 ENCOUNTER — Ambulatory Visit: Payer: Medicare Other | Admitting: Physical Therapy

## 2012-10-27 DIAGNOSIS — M25519 Pain in unspecified shoulder: Secondary | ICD-10-CM | POA: Diagnosis not present

## 2012-10-27 DIAGNOSIS — IMO0001 Reserved for inherently not codable concepts without codable children: Secondary | ICD-10-CM | POA: Diagnosis not present

## 2012-10-29 ENCOUNTER — Ambulatory Visit: Payer: Medicare Other | Admitting: Physical Therapy

## 2012-10-30 ENCOUNTER — Ambulatory Visit: Payer: Medicare Other | Admitting: Physical Therapy

## 2012-10-30 DIAGNOSIS — M25519 Pain in unspecified shoulder: Secondary | ICD-10-CM | POA: Diagnosis not present

## 2012-10-30 DIAGNOSIS — IMO0001 Reserved for inherently not codable concepts without codable children: Secondary | ICD-10-CM | POA: Diagnosis not present

## 2012-11-02 ENCOUNTER — Ambulatory Visit: Payer: Medicare Other | Admitting: Physical Therapy

## 2012-11-10 ENCOUNTER — Ambulatory Visit: Payer: Medicare Other | Admitting: Physical Therapy

## 2012-11-10 DIAGNOSIS — M25519 Pain in unspecified shoulder: Secondary | ICD-10-CM | POA: Diagnosis not present

## 2012-11-10 DIAGNOSIS — IMO0001 Reserved for inherently not codable concepts without codable children: Secondary | ICD-10-CM | POA: Diagnosis not present

## 2012-11-17 ENCOUNTER — Ambulatory Visit: Payer: Medicare Other | Admitting: Physical Therapy

## 2012-11-24 ENCOUNTER — Ambulatory Visit: Payer: Medicare Other | Attending: Orthopedic Surgery | Admitting: Physical Therapy

## 2012-11-24 DIAGNOSIS — M25519 Pain in unspecified shoulder: Secondary | ICD-10-CM | POA: Insufficient documentation

## 2012-11-24 DIAGNOSIS — IMO0001 Reserved for inherently not codable concepts without codable children: Secondary | ICD-10-CM | POA: Diagnosis not present

## 2012-12-03 ENCOUNTER — Ambulatory Visit: Payer: Medicare Other | Admitting: Physical Therapy

## 2012-12-03 DIAGNOSIS — M25519 Pain in unspecified shoulder: Secondary | ICD-10-CM | POA: Diagnosis not present

## 2012-12-03 DIAGNOSIS — IMO0001 Reserved for inherently not codable concepts without codable children: Secondary | ICD-10-CM | POA: Diagnosis not present

## 2012-12-04 ENCOUNTER — Ambulatory Visit: Payer: Medicare Other | Admitting: Physical Therapy

## 2012-12-22 ENCOUNTER — Ambulatory Visit: Payer: Medicare Other | Attending: Orthopedic Surgery | Admitting: Physical Therapy

## 2012-12-22 DIAGNOSIS — M25519 Pain in unspecified shoulder: Secondary | ICD-10-CM | POA: Diagnosis not present

## 2012-12-22 DIAGNOSIS — IMO0001 Reserved for inherently not codable concepts without codable children: Secondary | ICD-10-CM | POA: Insufficient documentation

## 2012-12-29 ENCOUNTER — Ambulatory Visit: Payer: Medicare Other | Admitting: Physical Therapy

## 2012-12-31 ENCOUNTER — Other Ambulatory Visit: Payer: Self-pay | Admitting: Dermatology

## 2012-12-31 ENCOUNTER — Ambulatory Visit: Payer: Medicare Other | Admitting: Physical Therapy

## 2012-12-31 DIAGNOSIS — Z85828 Personal history of other malignant neoplasm of skin: Secondary | ICD-10-CM | POA: Diagnosis not present

## 2012-12-31 DIAGNOSIS — L819 Disorder of pigmentation, unspecified: Secondary | ICD-10-CM | POA: Diagnosis not present

## 2012-12-31 DIAGNOSIS — L570 Actinic keratosis: Secondary | ICD-10-CM | POA: Diagnosis not present

## 2012-12-31 DIAGNOSIS — D485 Neoplasm of uncertain behavior of skin: Secondary | ICD-10-CM | POA: Diagnosis not present

## 2012-12-31 DIAGNOSIS — D1801 Hemangioma of skin and subcutaneous tissue: Secondary | ICD-10-CM | POA: Diagnosis not present

## 2012-12-31 DIAGNOSIS — L821 Other seborrheic keratosis: Secondary | ICD-10-CM | POA: Diagnosis not present

## 2013-01-05 ENCOUNTER — Ambulatory Visit: Payer: Medicare Other | Admitting: Physical Therapy

## 2013-01-05 DIAGNOSIS — Z4789 Encounter for other orthopedic aftercare: Secondary | ICD-10-CM | POA: Diagnosis not present

## 2013-01-07 ENCOUNTER — Ambulatory Visit: Payer: Medicare Other | Admitting: Physical Therapy

## 2013-01-12 ENCOUNTER — Ambulatory Visit: Payer: Medicare Other | Admitting: Physical Therapy

## 2013-01-15 ENCOUNTER — Ambulatory Visit: Payer: Medicare Other | Attending: Orthopedic Surgery | Admitting: Physical Therapy

## 2013-01-15 DIAGNOSIS — IMO0001 Reserved for inherently not codable concepts without codable children: Secondary | ICD-10-CM | POA: Diagnosis not present

## 2013-01-15 DIAGNOSIS — M25519 Pain in unspecified shoulder: Secondary | ICD-10-CM | POA: Insufficient documentation

## 2013-01-19 ENCOUNTER — Ambulatory Visit: Payer: Medicare Other | Admitting: Physical Therapy

## 2013-02-09 ENCOUNTER — Encounter: Payer: Medicare Other | Admitting: Physical Therapy

## 2013-03-08 DIAGNOSIS — H11159 Pinguecula, unspecified eye: Secondary | ICD-10-CM | POA: Diagnosis not present

## 2013-03-08 DIAGNOSIS — H2510 Age-related nuclear cataract, unspecified eye: Secondary | ICD-10-CM | POA: Diagnosis not present

## 2013-03-08 DIAGNOSIS — H43399 Other vitreous opacities, unspecified eye: Secondary | ICD-10-CM | POA: Diagnosis not present

## 2013-03-16 DIAGNOSIS — Z23 Encounter for immunization: Secondary | ICD-10-CM | POA: Diagnosis not present

## 2013-06-21 DIAGNOSIS — R079 Chest pain, unspecified: Secondary | ICD-10-CM | POA: Diagnosis not present

## 2013-06-22 ENCOUNTER — Ambulatory Visit
Admission: RE | Admit: 2013-06-22 | Discharge: 2013-06-22 | Disposition: A | Discharge status: Home / Self Care | Payer: Medicare Other | Source: Ambulatory Visit | Attending: Family Medicine | Admitting: Family Medicine

## 2013-06-22 ENCOUNTER — Other Ambulatory Visit: Payer: Self-pay | Admitting: Family Medicine

## 2013-06-22 DIAGNOSIS — R0781 Pleurodynia: Secondary | ICD-10-CM

## 2013-06-22 DIAGNOSIS — R079 Chest pain, unspecified: Secondary | ICD-10-CM | POA: Diagnosis not present

## 2013-07-02 ENCOUNTER — Other Ambulatory Visit: Payer: Self-pay | Admitting: Dermatology

## 2013-07-02 DIAGNOSIS — Z85828 Personal history of other malignant neoplasm of skin: Secondary | ICD-10-CM | POA: Diagnosis not present

## 2013-07-02 DIAGNOSIS — L570 Actinic keratosis: Secondary | ICD-10-CM | POA: Diagnosis not present

## 2013-07-02 DIAGNOSIS — L821 Other seborrheic keratosis: Secondary | ICD-10-CM | POA: Diagnosis not present

## 2013-07-02 DIAGNOSIS — D485 Neoplasm of uncertain behavior of skin: Secondary | ICD-10-CM | POA: Diagnosis not present

## 2013-11-04 DIAGNOSIS — L570 Actinic keratosis: Secondary | ICD-10-CM | POA: Diagnosis not present

## 2013-11-04 DIAGNOSIS — Z85828 Personal history of other malignant neoplasm of skin: Secondary | ICD-10-CM | POA: Diagnosis not present

## 2013-11-04 DIAGNOSIS — L821 Other seborrheic keratosis: Secondary | ICD-10-CM | POA: Diagnosis not present

## 2014-01-20 DIAGNOSIS — Z79890 Hormone replacement therapy (postmenopausal): Secondary | ICD-10-CM | POA: Diagnosis not present

## 2014-01-20 DIAGNOSIS — M810 Age-related osteoporosis without current pathological fracture: Secondary | ICD-10-CM | POA: Diagnosis not present

## 2014-01-20 DIAGNOSIS — Z780 Asymptomatic menopausal state: Secondary | ICD-10-CM | POA: Diagnosis not present

## 2014-01-20 DIAGNOSIS — Z01419 Encounter for gynecological examination (general) (routine) without abnormal findings: Secondary | ICD-10-CM | POA: Diagnosis not present

## 2014-02-02 ENCOUNTER — Other Ambulatory Visit: Payer: Self-pay | Admitting: Dermatology

## 2014-02-02 DIAGNOSIS — D485 Neoplasm of uncertain behavior of skin: Secondary | ICD-10-CM | POA: Diagnosis not present

## 2014-02-02 DIAGNOSIS — L819 Disorder of pigmentation, unspecified: Secondary | ICD-10-CM | POA: Diagnosis not present

## 2014-02-02 DIAGNOSIS — D2370 Other benign neoplasm of skin of unspecified lower limb, including hip: Secondary | ICD-10-CM | POA: Diagnosis not present

## 2014-02-02 DIAGNOSIS — D239 Other benign neoplasm of skin, unspecified: Secondary | ICD-10-CM | POA: Diagnosis not present

## 2014-02-02 DIAGNOSIS — Z85828 Personal history of other malignant neoplasm of skin: Secondary | ICD-10-CM | POA: Diagnosis not present

## 2014-02-02 DIAGNOSIS — L821 Other seborrheic keratosis: Secondary | ICD-10-CM | POA: Diagnosis not present

## 2014-02-02 DIAGNOSIS — L570 Actinic keratosis: Secondary | ICD-10-CM | POA: Diagnosis not present

## 2014-02-02 DIAGNOSIS — C44319 Basal cell carcinoma of skin of other parts of face: Secondary | ICD-10-CM | POA: Diagnosis not present

## 2014-02-17 DIAGNOSIS — Z85828 Personal history of other malignant neoplasm of skin: Secondary | ICD-10-CM | POA: Diagnosis not present

## 2014-02-17 DIAGNOSIS — C44319 Basal cell carcinoma of skin of other parts of face: Secondary | ICD-10-CM | POA: Diagnosis not present

## 2014-04-09 DIAGNOSIS — Z23 Encounter for immunization: Secondary | ICD-10-CM | POA: Diagnosis not present

## 2014-04-13 DIAGNOSIS — H02105 Unspecified ectropion of left lower eyelid: Secondary | ICD-10-CM | POA: Diagnosis not present

## 2014-04-13 DIAGNOSIS — H2513 Age-related nuclear cataract, bilateral: Secondary | ICD-10-CM | POA: Diagnosis not present

## 2014-05-27 DIAGNOSIS — Z0000 Encounter for general adult medical examination without abnormal findings: Secondary | ICD-10-CM | POA: Diagnosis not present

## 2014-05-27 DIAGNOSIS — Z13220 Encounter for screening for lipoid disorders: Secondary | ICD-10-CM | POA: Diagnosis not present

## 2014-07-26 DIAGNOSIS — L570 Actinic keratosis: Secondary | ICD-10-CM | POA: Diagnosis not present

## 2014-07-26 DIAGNOSIS — Z85828 Personal history of other malignant neoplasm of skin: Secondary | ICD-10-CM | POA: Diagnosis not present

## 2014-08-15 DIAGNOSIS — Z0000 Encounter for general adult medical examination without abnormal findings: Secondary | ICD-10-CM | POA: Diagnosis not present

## 2014-08-15 DIAGNOSIS — Z23 Encounter for immunization: Secondary | ICD-10-CM | POA: Diagnosis not present

## 2014-08-15 DIAGNOSIS — S66911A Strain of unspecified muscle, fascia and tendon at wrist and hand level, right hand, initial encounter: Secondary | ICD-10-CM | POA: Diagnosis not present

## 2014-08-15 DIAGNOSIS — S63501A Unspecified sprain of right wrist, initial encounter: Secondary | ICD-10-CM | POA: Diagnosis not present

## 2014-08-25 DIAGNOSIS — M810 Age-related osteoporosis without current pathological fracture: Secondary | ICD-10-CM | POA: Diagnosis not present

## 2014-08-25 DIAGNOSIS — Z1231 Encounter for screening mammogram for malignant neoplasm of breast: Secondary | ICD-10-CM | POA: Diagnosis not present

## 2015-02-22 DIAGNOSIS — Z780 Asymptomatic menopausal state: Secondary | ICD-10-CM | POA: Diagnosis not present

## 2015-02-22 DIAGNOSIS — Z79890 Hormone replacement therapy (postmenopausal): Secondary | ICD-10-CM | POA: Diagnosis not present

## 2015-03-25 DIAGNOSIS — Z23 Encounter for immunization: Secondary | ICD-10-CM | POA: Diagnosis not present

## 2015-04-12 DIAGNOSIS — L820 Inflamed seborrheic keratosis: Secondary | ICD-10-CM | POA: Diagnosis not present

## 2015-04-12 DIAGNOSIS — D1801 Hemangioma of skin and subcutaneous tissue: Secondary | ICD-10-CM | POA: Diagnosis not present

## 2015-04-12 DIAGNOSIS — Z85828 Personal history of other malignant neoplasm of skin: Secondary | ICD-10-CM | POA: Diagnosis not present

## 2015-04-12 DIAGNOSIS — D225 Melanocytic nevi of trunk: Secondary | ICD-10-CM | POA: Diagnosis not present

## 2015-04-12 DIAGNOSIS — L814 Other melanin hyperpigmentation: Secondary | ICD-10-CM | POA: Diagnosis not present

## 2015-04-26 ENCOUNTER — Other Ambulatory Visit: Payer: Self-pay | Admitting: Gastroenterology

## 2015-04-26 DIAGNOSIS — K621 Rectal polyp: Secondary | ICD-10-CM | POA: Diagnosis not present

## 2015-04-26 DIAGNOSIS — Z1211 Encounter for screening for malignant neoplasm of colon: Secondary | ICD-10-CM | POA: Diagnosis not present

## 2015-04-26 DIAGNOSIS — K640 First degree hemorrhoids: Secondary | ICD-10-CM | POA: Diagnosis not present

## 2015-06-25 ENCOUNTER — Emergency Department (HOSPITAL_COMMUNITY)
Admission: EM | Admit: 2015-06-25 | Discharge: 2015-06-25 | Disposition: A | Discharge status: Home / Self Care | Payer: Medicare Other | Attending: Emergency Medicine | Admitting: Emergency Medicine

## 2015-06-25 ENCOUNTER — Encounter (HOSPITAL_COMMUNITY): Payer: Self-pay | Admitting: *Deleted

## 2015-06-25 ENCOUNTER — Emergency Department (HOSPITAL_COMMUNITY): Payer: Medicare Other

## 2015-06-25 DIAGNOSIS — Z7951 Long term (current) use of inhaled steroids: Secondary | ICD-10-CM | POA: Insufficient documentation

## 2015-06-25 DIAGNOSIS — Z79899 Other long term (current) drug therapy: Secondary | ICD-10-CM | POA: Insufficient documentation

## 2015-06-25 DIAGNOSIS — H9202 Otalgia, left ear: Secondary | ICD-10-CM | POA: Diagnosis not present

## 2015-06-25 DIAGNOSIS — Z7982 Long term (current) use of aspirin: Secondary | ICD-10-CM | POA: Diagnosis not present

## 2015-06-25 DIAGNOSIS — H5712 Ocular pain, left eye: Secondary | ICD-10-CM | POA: Diagnosis not present

## 2015-06-25 DIAGNOSIS — M1990 Unspecified osteoarthritis, unspecified site: Secondary | ICD-10-CM | POA: Insufficient documentation

## 2015-06-25 DIAGNOSIS — H938X2 Other specified disorders of left ear: Secondary | ICD-10-CM | POA: Insufficient documentation

## 2015-06-25 DIAGNOSIS — R51 Headache: Secondary | ICD-10-CM | POA: Diagnosis present

## 2015-06-25 DIAGNOSIS — M810 Age-related osteoporosis without current pathological fracture: Secondary | ICD-10-CM | POA: Insufficient documentation

## 2015-06-25 DIAGNOSIS — R519 Headache, unspecified: Secondary | ICD-10-CM

## 2015-06-25 LAB — SEDIMENTATION RATE: Sed Rate: 10 mm/hr (ref 0–22)

## 2015-06-25 LAB — CBC WITH DIFFERENTIAL/PLATELET
BASOS ABS: 0 10*3/uL (ref 0.0–0.1)
Basophils Relative: 1 %
EOS PCT: 1 %
Eosinophils Absolute: 0 10*3/uL (ref 0.0–0.7)
HCT: 40.9 % (ref 36.0–46.0)
Hemoglobin: 13.2 g/dL (ref 12.0–15.0)
LYMPHS PCT: 31 %
Lymphs Abs: 1.5 10*3/uL (ref 0.7–4.0)
MCH: 30.1 pg (ref 26.0–34.0)
MCHC: 32.3 g/dL (ref 30.0–36.0)
MCV: 93.2 fL (ref 78.0–100.0)
MONO ABS: 0.3 10*3/uL (ref 0.1–1.0)
Monocytes Relative: 5 %
Neutro Abs: 3.2 10*3/uL (ref 1.7–7.7)
Neutrophils Relative %: 62 %
PLATELETS: 258 10*3/uL (ref 150–400)
RBC: 4.39 MIL/uL (ref 3.87–5.11)
RDW: 13 % (ref 11.5–15.5)
WBC: 5 10*3/uL (ref 4.0–10.5)

## 2015-06-25 LAB — BASIC METABOLIC PANEL
Anion gap: 8 (ref 5–15)
BUN: 23 mg/dL — AB (ref 6–20)
CO2: 30 mmol/L (ref 22–32)
Calcium: 9.6 mg/dL (ref 8.9–10.3)
Chloride: 102 mmol/L (ref 101–111)
Creatinine, Ser: 0.95 mg/dL (ref 0.44–1.00)
GFR calc Af Amer: >60 mL/min (ref >60)
GFR, EST NON AFRICAN AMERICAN: 58 mL/min — AB (ref >60)
GLUCOSE: 83 mg/dL (ref 65–99)
POTASSIUM: 4 mmol/L (ref 3.5–5.1)
Sodium: 140 mmol/L (ref 135–145)

## 2015-06-25 MED ORDER — PROPARACAINE HCL 0.5 % OP SOLN
1.0000 [drp] | Freq: Once | OPHTHALMIC | Status: AC
  Administered 2015-06-25: 1 [drp] via OPHTHALMIC
  Filled 2015-06-25: qty 15

## 2015-06-25 NOTE — ED Notes (Signed)
PT DISCHARGED. INSTRUCTIONS GIVEN. AAOX3. PT IN NO APPARENT DISTRESS OR PAIN. THE OPPORTUNITY TO ASK QUESTIONS WAS PROVIDED. 

## 2015-06-25 NOTE — ED Notes (Signed)
Pt reported feeling head is full. Pt denies dizziness/lightheadedness, visual disturbances, nausea, LOC or being hit in the head. PA at bedside.

## 2015-06-25 NOTE — Discharge Instructions (Signed)

## 2015-06-25 NOTE — ED Provider Notes (Signed)
CSN: [id number]     Arrival date & time 06/25/15  1247 History   First MD Initiated Contact with Patient 06/25/15 1313     Chief Complaint  Patient presents with  . Headache     (Consider location/radiation/quality/duration/timing/severity/associated sxs/prior Treatment) Patient is a 75 y.o. female presenting with headaches.  Headache    Marie Coleman is a 75 y.o. female with PMH significant for vertigo, arthritis, seasonal allergies who presents with 3 week hx of sporadic (3 episodes lasting < 20 seconds), severe left temporal pain.  Associated symptoms include left ear fullness and pain behind her left eye.  Denies fever, dizziness, weakness, difficulty walking, blurred vision, diplopia, CP, SOB, cough, sore throat, rhinorrhea, lacrimation, N/V/D, or abdominal pain.  Nothing like this has happened before.  No head trauma/injury.  No hx of headaches/migraines.   Past Medical History  Diagnosis Date  . Vertigo   . Arthritis     OA AND PAIN LEFT KNEE AND PAIN IN SHOULDERS, HANDS, LOWER BACK  . Osteoporosis   . Seasonal allergies    Past Surgical History  Procedure Laterality Date  . Hip pinning,cannulated  05/15/2011    Procedure: CANNULATED HIP PINNING;  Surgeon: Tobi Bastos;  Location: [facility];  Service: Orthopedics;  Laterality: Right;  Cannulated Right Hip Pinning   (c-arm)   . Abdominal hysterectomy  1977  . Surgery for neck spurs 1980's    . Lumbar surgery for hnp 1990's    . Orif left wrist  1990's    . Left rotator cuff repair jan 2012    . Total knee arthroplasty  09/23/2011    Procedure: TOTAL KNEE ARTHROPLASTY;  Surgeon: Gearlean Alf, MD;  Location: [facility];  Service: Orthopedics;  Laterality: Left;  . Total hip revision  04/20/2012    Procedure: TOTAL HIP REVISION;  Surgeon: Gearlean Alf, MD;  Location: [facility];  Service: Orthopedics;  Laterality: Right;  CONVERSION OF  PREVIOUS HIP SURGERY   No family history on file. Social History  Substance Use Topics  .  Smoking status: Never Smoker   . Smokeless tobacco: Never Used  . Alcohol Use: Yes     Comment: OCCAS   OB History    No data available     Review of Systems  Neurological: Positive for headaches.   All other systems negative unless otherwise stated in HPI    Allergies  Sulfa antibiotics  Home Medications   Prior to Admission medications   Medication Sig Start Date End Date Taking? Authorizing Provider  amoxicillin (AMOXIL) 500 MG capsule Take 2,000 mg by mouth as directed. Take 2041m one hour prior to appointment   Yes Historical Provider, MD  aspirin 81 MG chewable tablet Chew 81 mg by mouth daily.   Yes Historical Provider, MD  Bioflavonoid Products (VITAMIN C) CHEW Chew 2 tablets by mouth daily.   Yes Historical Provider, MD  calcium carbonate (TUMS - DOSED IN MG ELEMENTAL CALCIUM) 500 MG chewable tablet Chew 1 tablet by mouth daily as needed for indigestion or heartburn.   Yes Historical Provider, MD  Calcium-Vitamin A-Vitamin D (LIQUID CALCIUM PO) Take 15 mLs by mouth daily.   Yes Historical Provider, MD  FLUZONE HIGH-DOSE 0.5 ML SUSY Inject 1 Dose as directed once. 03/25/15  Yes Historical Provider, MD  Multiple Vitamin (MULTIVITAMIN WITH MINERALS) TABS tablet Take 1 tablet by mouth daily.   Yes Historical Provider, MD  sodium chloride (OCEAN) 0.65 % nasal spray Place 1 spray into  the nose as needed. Allergies   Yes Historical Provider, MD  Triamcinolone Acetonide (NASACORT ALLERGY 24HR NA) Place 1 spray into the nose daily as needed (allergies).   Yes Historical Provider, MD  acetaminophen (TYLENOL) 500 MG tablet Take 500 mg by mouth daily as needed for moderate pain or headache. Reported on 06/25/2015    Historical Provider, MD   BP 129/81 mmHg  Pulse 75  Temp(Src) 98.3 F (36.8 C) (Oral)  Resp 16  SpO2 99% Physical Exam  Constitutional: She is oriented to person, place, and time. She appears well-developed and well-nourished.  HENT:  Head: Normocephalic and  atraumatic. Head is without raccoon's eyes, without Battle's sign, without abrasion, without contusion, without laceration, without right periorbital erythema and without left periorbital erythema.    Right Ear: Tympanic membrane and ear canal normal. There is tenderness. No drainage. No foreign bodies. No mastoid tenderness. Tympanic membrane is not injected, not perforated, not erythematous, not retracted and not bulging. No middle ear effusion. No hemotympanum.  Left Ear: Tympanic membrane and ear canal normal. There is tenderness. No drainage. No foreign bodies. No mastoid tenderness. Tympanic membrane is not injected, not perforated, not erythematous, not retracted and not bulging.  No middle ear effusion. No hemotympanum.  Nose: Right sinus exhibits no maxillary sinus tenderness and no frontal sinus tenderness. Left sinus exhibits maxillary sinus tenderness (mild) and frontal sinus tenderness (mild).  Mouth/Throat: Uvula is midline, oropharynx is clear and moist and mucous membranes are normal. No oropharyngeal exudate, posterior oropharyngeal edema or posterior oropharyngeal erythema.  Temporal artery is not enlarged or prominent.   Eyes: Conjunctivae are normal. Pupils are equal, round, and reactive to light.  IOP OS 16.  Neck: Normal range of motion. Neck supple.  Cardiovascular: Normal rate, regular rhythm and normal heart sounds.   No murmur heard. Pulmonary/Chest: Effort normal and breath sounds normal. No accessory muscle usage or stridor. No respiratory distress. She has no wheezes. She has no rhonchi. She has no rales.  Abdominal: Soft. Bowel sounds are normal. She exhibits no distension. There is no tenderness.  Musculoskeletal: Normal range of motion.  Lymphadenopathy:    She has no cervical adenopathy.  Neurological: She is alert and oriented to person, place, and time.  Mental Status:   AOx3.  Speech clear without dysarthria. Cranial Nerves:  I-not tested  II-PERRLA  III,  IV, VI-EOMs intact  V-temporal and masseter strength intact  VII-symmetrical facial movements intact, no facial droop  VIII-hearing grossly intact bilaterally  IX, X-gag intact  XI-strength of sternomastoid and trapezius muscles 5/5  XII-tongue midline Motor:   Good muscle bulk and tone  Strength 5/5 bilaterally in upper and lower extremities   Cerebellar--RAMs, finger to nose intact  Romberg--maintains balance with eyes closed  Casual and tandem gait normal without ataxia  No pronator drift Sensory:  Intact in upper and lower extremities   Skin: Skin is warm and dry.  Psychiatric: She has a normal mood and affect. Her behavior is normal.    ED Course  Procedures (including critical care time) Labs Review Labs Reviewed  BASIC METABOLIC PANEL - Abnormal; Notable for the following:    BUN 23 (*)    GFR calc non Af Amer 58 (*)    All other components within normal limits  CBC WITH DIFFERENTIAL/PLATELET  SEDIMENTATION RATE    Imaging Review Ct Head Wo Contrast  06/25/2015  CLINICAL DATA:  75 year old female with left temporal headache EXAM: CT HEAD WITHOUT CONTRAST CT MAXILLOFACIAL WITHOUT  CONTRAST TECHNIQUE: Multidetector CT imaging of the head and maxillofacial structures were performed using the standard protocol without intravenous contrast. Multiplanar CT image reconstructions of the maxillofacial structures were also generated. COMPARISON:  MRI of the brain 02/01/2008 FINDINGS: CT HEAD FINDINGS Negative for acute intracranial hemorrhage, acute infarction, mass, mass effect, hydrocephalus or midline shift. Gray-white differentiation is preserved throughout. No focal soft tissue or calvarial abnormality. Globes and orbits are symmetric and unremarkable bilaterally. Normal aeration of the mastoid air cells and visualized paranasal sinuses. CT MAXILLOFACIAL FINDINGS Normally aerated paranasal sinuses and mastoid air cells. No evidence of facial fracture or lesion. No focal soft tissue  abnormality. Streak artifact from multiple dental amalgams limits evaluation of the hard palate and mouth. Unremarkable appearance of the salivary glands. IMPRESSION: CT HEAD 1. Negative CT FACE 1. Negative Electronically Signed   By: Jacqulynn Cadet M.D.   On: 06/25/2015 14:34   Ct Maxillofacial Wo Cm  06/25/2015  CLINICAL DATA:  75 year old female with left temporal headache EXAM: CT HEAD WITHOUT CONTRAST CT MAXILLOFACIAL WITHOUT CONTRAST TECHNIQUE: Multidetector CT imaging of the head and maxillofacial structures were performed using the standard protocol without intravenous contrast. Multiplanar CT image reconstructions of the maxillofacial structures were also generated. COMPARISON:  MRI of the brain 02/01/2008 FINDINGS: CT HEAD FINDINGS Negative for acute intracranial hemorrhage, acute infarction, mass, mass effect, hydrocephalus or midline shift. Gray-white differentiation is preserved throughout. No focal soft tissue or calvarial abnormality. Globes and orbits are symmetric and unremarkable bilaterally. Normal aeration of the mastoid air cells and visualized paranasal sinuses. CT MAXILLOFACIAL FINDINGS Normally aerated paranasal sinuses and mastoid air cells. No evidence of facial fracture or lesion. No focal soft tissue abnormality. Streak artifact from multiple dental amalgams limits evaluation of the hard palate and mouth. Unremarkable appearance of the salivary glands. IMPRESSION: CT HEAD 1. Negative CT FACE 1. Negative Electronically Signed   By: Jacqulynn Cadet M.D.   On: 06/25/2015 14:34   I have personally reviewed and evaluated these images and lab results as part of my medical decision-making.   EKG Interpretation None      MDM   Final diagnoses:  Nonintractable headache, unspecified chronicity pattern, unspecified headache type    Patient presents with left temporal/facial sporadic (3 episodes), short (<20 sec), random severe headaches over the past 3 weeks.  No lacrimation,  rhinorrhea, visual disturbance, gait abnormalities, weakness, dizziness, CP, SOB, abdominal pain or fever.  VSS, NAD.  On exam, no focal neurological deficits, heart RRR, lungs CTAB, abdomen soft and benign.  IOP OS 16.  TMs clear bilaterally.  Mild left maxillary and frontal tenderness.  Temporal region TTP.  Temporal artery is not prominent or enlarged.  DDx includes GCA, primary headache, sinusitis, glaucoma.  Will obtain ESR, BMP, CBC, CT max/fac and CT head.  CBC, BMP unremarkable.   ESR 10. CT head and max/fac unremarkable.  Negative for acute hemorrhage, infarct, mass, hydrocephalus.  Labs reassuring, vitals stable.  Patient is asymptomatic at this time.  Recommend zyrtec QD and ibuprofen 800 mg TID PRN.Evaluation does not show pathology requiring ongoing emergent intervention or admission. Pt is hemodynamically stable and mentating appropriately. Discussed findings/results and plan with patient/guardian, who agrees with plan. All questions answered. Return precautions discussed and outpatient follow up given.   Case has been discussed with and seen by Dr. Audie Pinto who agrees with the above plan for discharge.      Gloriann Loan, PA-C 06/25/15 1603  Leonard Schwartz, MD 07/05/15 [phone]

## 2015-06-25 NOTE — ED Notes (Signed)
Patient states she has had 3 episodes of intense left temporal pain over last 3 weeks.  Patient states the first time it happened, the pain began intensely and lasted @ 3 seconds.  Patient states she has had 2 similar episodes since the first time, both lasting <20 seconds each.  The most recent episode was @40  minutes PTA.  Patient states these episodes were not accompanied by dizziness or weakness, but the pain was debilitating.  Patient denies blurred vision with these episodes, but states she has a sense of fullness of left ear and behind left eye.  Patient has hx of sinus pain/pressure and associates the sense of fullness with that history.  Patient takes Nasacort as needed.  Patient denies chest pain, N/V and fever.  Patient has had 81 mg chewable baby aspirin today - which she takes daily.  Patient denies cardiac history or CVA.

## 2015-06-27 DIAGNOSIS — R51 Headache: Secondary | ICD-10-CM | POA: Diagnosis not present

## 2015-06-27 DIAGNOSIS — G43009 Migraine without aura, not intractable, without status migrainosus: Secondary | ICD-10-CM | POA: Diagnosis not present

## 2015-07-05 DIAGNOSIS — R51 Headache: Secondary | ICD-10-CM | POA: Diagnosis not present

## 2015-07-05 DIAGNOSIS — K30 Functional dyspepsia: Secondary | ICD-10-CM | POA: Diagnosis not present

## 2015-07-17 DIAGNOSIS — H5213 Myopia, bilateral: Secondary | ICD-10-CM | POA: Diagnosis not present

## 2015-07-17 DIAGNOSIS — H02105 Unspecified ectropion of left lower eyelid: Secondary | ICD-10-CM | POA: Diagnosis not present

## 2015-07-17 DIAGNOSIS — R51 Headache: Secondary | ICD-10-CM | POA: Diagnosis not present

## 2015-08-16 DIAGNOSIS — Z0000 Encounter for general adult medical examination without abnormal findings: Secondary | ICD-10-CM | POA: Diagnosis not present

## 2015-10-16 DIAGNOSIS — Z1231 Encounter for screening mammogram for malignant neoplasm of breast: Secondary | ICD-10-CM | POA: Diagnosis not present

## 2016-03-14 DIAGNOSIS — Z01419 Encounter for gynecological examination (general) (routine) without abnormal findings: Secondary | ICD-10-CM | POA: Diagnosis not present

## 2016-03-14 DIAGNOSIS — Z780 Asymptomatic menopausal state: Secondary | ICD-10-CM | POA: Diagnosis not present

## 2016-03-14 DIAGNOSIS — Z6820 Body mass index (BMI) 20.0-20.9, adult: Secondary | ICD-10-CM | POA: Diagnosis not present

## 2016-03-14 DIAGNOSIS — N952 Postmenopausal atrophic vaginitis: Secondary | ICD-10-CM | POA: Diagnosis not present

## 2016-04-10 DIAGNOSIS — Z23 Encounter for immunization: Secondary | ICD-10-CM | POA: Diagnosis not present

## 2016-06-19 DIAGNOSIS — L814 Other melanin hyperpigmentation: Secondary | ICD-10-CM | POA: Diagnosis not present

## 2016-06-19 DIAGNOSIS — L723 Sebaceous cyst: Secondary | ICD-10-CM | POA: Diagnosis not present

## 2016-06-19 DIAGNOSIS — D225 Melanocytic nevi of trunk: Secondary | ICD-10-CM | POA: Diagnosis not present

## 2016-06-19 DIAGNOSIS — L438 Other lichen planus: Secondary | ICD-10-CM | POA: Diagnosis not present

## 2016-06-19 DIAGNOSIS — Z85828 Personal history of other malignant neoplasm of skin: Secondary | ICD-10-CM | POA: Diagnosis not present

## 2016-06-19 DIAGNOSIS — D1801 Hemangioma of skin and subcutaneous tissue: Secondary | ICD-10-CM | POA: Diagnosis not present

## 2016-06-19 DIAGNOSIS — L821 Other seborrheic keratosis: Secondary | ICD-10-CM | POA: Diagnosis not present

## 2016-06-19 DIAGNOSIS — L570 Actinic keratosis: Secondary | ICD-10-CM | POA: Diagnosis not present

## 2016-07-18 DIAGNOSIS — Z85828 Personal history of other malignant neoplasm of skin: Secondary | ICD-10-CM | POA: Diagnosis not present

## 2016-07-18 DIAGNOSIS — L720 Epidermal cyst: Secondary | ICD-10-CM | POA: Diagnosis not present

## 2016-07-22 DIAGNOSIS — H2513 Age-related nuclear cataract, bilateral: Secondary | ICD-10-CM | POA: Diagnosis not present

## 2016-07-22 DIAGNOSIS — H02105 Unspecified ectropion of left lower eyelid: Secondary | ICD-10-CM | POA: Diagnosis not present

## 2016-07-22 DIAGNOSIS — H5213 Myopia, bilateral: Secondary | ICD-10-CM | POA: Diagnosis not present

## 2016-08-21 DIAGNOSIS — R5383 Other fatigue: Secondary | ICD-10-CM | POA: Diagnosis not present

## 2016-08-21 DIAGNOSIS — M810 Age-related osteoporosis without current pathological fracture: Secondary | ICD-10-CM | POA: Diagnosis not present

## 2016-08-21 DIAGNOSIS — Z0000 Encounter for general adult medical examination without abnormal findings: Secondary | ICD-10-CM | POA: Diagnosis not present

## 2016-09-09 DIAGNOSIS — M810 Age-related osteoporosis without current pathological fracture: Secondary | ICD-10-CM | POA: Diagnosis not present

## 2016-10-01 DIAGNOSIS — M542 Cervicalgia: Secondary | ICD-10-CM | POA: Diagnosis not present

## 2016-10-29 DIAGNOSIS — Z96641 Presence of right artificial hip joint: Secondary | ICD-10-CM | POA: Diagnosis not present

## 2016-10-29 DIAGNOSIS — M1711 Unilateral primary osteoarthritis, right knee: Secondary | ICD-10-CM | POA: Diagnosis not present

## 2016-10-29 DIAGNOSIS — Z471 Aftercare following joint replacement surgery: Secondary | ICD-10-CM | POA: Diagnosis not present

## 2017-02-07 DIAGNOSIS — N3001 Acute cystitis with hematuria: Secondary | ICD-10-CM | POA: Diagnosis not present

## 2017-02-18 DIAGNOSIS — K5901 Slow transit constipation: Secondary | ICD-10-CM | POA: Diagnosis not present

## 2017-04-02 DIAGNOSIS — Z23 Encounter for immunization: Secondary | ICD-10-CM | POA: Diagnosis not present

## 2017-04-03 DIAGNOSIS — N952 Postmenopausal atrophic vaginitis: Secondary | ICD-10-CM | POA: Diagnosis not present

## 2017-04-03 DIAGNOSIS — M810 Age-related osteoporosis without current pathological fracture: Secondary | ICD-10-CM | POA: Diagnosis not present

## 2017-04-22 DIAGNOSIS — Z1231 Encounter for screening mammogram for malignant neoplasm of breast: Secondary | ICD-10-CM | POA: Diagnosis not present

## 2017-06-17 HISTORY — PX: CATARACT EXTRACTION, BILATERAL: SHX1313

## 2017-07-21 DIAGNOSIS — H5213 Myopia, bilateral: Secondary | ICD-10-CM | POA: Diagnosis not present

## 2017-07-21 DIAGNOSIS — H2513 Age-related nuclear cataract, bilateral: Secondary | ICD-10-CM | POA: Diagnosis not present

## 2017-08-07 DIAGNOSIS — H2512 Age-related nuclear cataract, left eye: Secondary | ICD-10-CM | POA: Diagnosis not present

## 2017-08-07 DIAGNOSIS — H25812 Combined forms of age-related cataract, left eye: Secondary | ICD-10-CM | POA: Diagnosis not present

## 2017-08-25 DIAGNOSIS — Z131 Encounter for screening for diabetes mellitus: Secondary | ICD-10-CM | POA: Diagnosis not present

## 2017-08-25 DIAGNOSIS — Z136 Encounter for screening for cardiovascular disorders: Secondary | ICD-10-CM | POA: Diagnosis not present

## 2017-08-25 DIAGNOSIS — Z0000 Encounter for general adult medical examination without abnormal findings: Secondary | ICD-10-CM | POA: Diagnosis not present

## 2017-08-25 DIAGNOSIS — M810 Age-related osteoporosis without current pathological fracture: Secondary | ICD-10-CM | POA: Diagnosis not present

## 2017-08-25 DIAGNOSIS — Z13220 Encounter for screening for lipoid disorders: Secondary | ICD-10-CM | POA: Diagnosis not present

## 2017-08-28 DIAGNOSIS — H2511 Age-related nuclear cataract, right eye: Secondary | ICD-10-CM | POA: Diagnosis not present

## 2017-08-28 DIAGNOSIS — H25811 Combined forms of age-related cataract, right eye: Secondary | ICD-10-CM | POA: Diagnosis not present

## 2017-10-15 DIAGNOSIS — D225 Melanocytic nevi of trunk: Secondary | ICD-10-CM | POA: Diagnosis not present

## 2017-10-15 DIAGNOSIS — L570 Actinic keratosis: Secondary | ICD-10-CM | POA: Diagnosis not present

## 2017-10-15 DIAGNOSIS — L821 Other seborrheic keratosis: Secondary | ICD-10-CM | POA: Diagnosis not present

## 2017-10-15 DIAGNOSIS — Z85828 Personal history of other malignant neoplasm of skin: Secondary | ICD-10-CM | POA: Diagnosis not present

## 2017-10-15 DIAGNOSIS — D1801 Hemangioma of skin and subcutaneous tissue: Secondary | ICD-10-CM | POA: Diagnosis not present

## 2017-10-15 DIAGNOSIS — L814 Other melanin hyperpigmentation: Secondary | ICD-10-CM | POA: Diagnosis not present

## 2017-11-25 DIAGNOSIS — M25511 Pain in right shoulder: Secondary | ICD-10-CM | POA: Diagnosis not present

## 2017-11-25 DIAGNOSIS — M25512 Pain in left shoulder: Secondary | ICD-10-CM | POA: Diagnosis not present

## 2017-12-16 DIAGNOSIS — H02105 Unspecified ectropion of left lower eyelid: Secondary | ICD-10-CM | POA: Diagnosis not present

## 2018-01-28 DIAGNOSIS — H0011 Chalazion right upper eyelid: Secondary | ICD-10-CM | POA: Diagnosis not present

## 2018-04-01 DIAGNOSIS — Z23 Encounter for immunization: Secondary | ICD-10-CM | POA: Diagnosis not present

## 2018-04-23 DIAGNOSIS — Z1231 Encounter for screening mammogram for malignant neoplasm of breast: Secondary | ICD-10-CM | POA: Diagnosis not present

## 2018-05-05 DIAGNOSIS — Z01419 Encounter for gynecological examination (general) (routine) without abnormal findings: Secondary | ICD-10-CM | POA: Diagnosis not present

## 2018-05-05 DIAGNOSIS — Z780 Asymptomatic menopausal state: Secondary | ICD-10-CM | POA: Diagnosis not present

## 2018-05-05 DIAGNOSIS — N952 Postmenopausal atrophic vaginitis: Secondary | ICD-10-CM | POA: Diagnosis not present

## 2018-05-05 DIAGNOSIS — M810 Age-related osteoporosis without current pathological fracture: Secondary | ICD-10-CM | POA: Diagnosis not present

## 2018-08-27 DIAGNOSIS — M25512 Pain in left shoulder: Secondary | ICD-10-CM | POA: Diagnosis not present

## 2018-08-27 DIAGNOSIS — M67411 Ganglion, right shoulder: Secondary | ICD-10-CM | POA: Diagnosis not present

## 2018-08-27 DIAGNOSIS — M19011 Primary osteoarthritis, right shoulder: Secondary | ICD-10-CM | POA: Diagnosis not present

## 2018-08-27 DIAGNOSIS — M25511 Pain in right shoulder: Secondary | ICD-10-CM | POA: Diagnosis not present

## 2018-09-01 DIAGNOSIS — Z136 Encounter for screening for cardiovascular disorders: Secondary | ICD-10-CM | POA: Diagnosis not present

## 2018-09-01 DIAGNOSIS — Z0000 Encounter for general adult medical examination without abnormal findings: Secondary | ICD-10-CM | POA: Diagnosis not present

## 2018-09-01 DIAGNOSIS — M810 Age-related osteoporosis without current pathological fracture: Secondary | ICD-10-CM | POA: Diagnosis not present

## 2018-09-01 DIAGNOSIS — Z23 Encounter for immunization: Secondary | ICD-10-CM | POA: Diagnosis not present

## 2018-09-01 DIAGNOSIS — E7800 Pure hypercholesterolemia, unspecified: Secondary | ICD-10-CM | POA: Diagnosis not present

## 2018-11-04 DIAGNOSIS — Z85828 Personal history of other malignant neoplasm of skin: Secondary | ICD-10-CM | POA: Diagnosis not present

## 2018-11-04 DIAGNOSIS — L570 Actinic keratosis: Secondary | ICD-10-CM | POA: Diagnosis not present

## 2018-11-04 DIAGNOSIS — L814 Other melanin hyperpigmentation: Secondary | ICD-10-CM | POA: Diagnosis not present

## 2018-11-04 DIAGNOSIS — L821 Other seborrheic keratosis: Secondary | ICD-10-CM | POA: Diagnosis not present

## 2018-11-12 DIAGNOSIS — S00452A Superficial foreign body of left ear, initial encounter: Secondary | ICD-10-CM | POA: Diagnosis not present

## 2018-11-12 DIAGNOSIS — T169XXA Foreign body in ear, unspecified ear, initial encounter: Secondary | ICD-10-CM | POA: Diagnosis not present

## 2018-11-23 DIAGNOSIS — Z8262 Family history of osteoporosis: Secondary | ICD-10-CM | POA: Diagnosis not present

## 2018-11-23 DIAGNOSIS — M810 Age-related osteoporosis without current pathological fracture: Secondary | ICD-10-CM | POA: Diagnosis not present

## 2018-11-23 DIAGNOSIS — Z90710 Acquired absence of both cervix and uterus: Secondary | ICD-10-CM | POA: Diagnosis not present

## 2019-02-10 DIAGNOSIS — H02105 Unspecified ectropion of left lower eyelid: Secondary | ICD-10-CM | POA: Diagnosis not present

## 2019-02-10 DIAGNOSIS — Z961 Presence of intraocular lens: Secondary | ICD-10-CM | POA: Diagnosis not present

## 2019-02-10 DIAGNOSIS — H52203 Unspecified astigmatism, bilateral: Secondary | ICD-10-CM | POA: Diagnosis not present

## 2019-02-27 DIAGNOSIS — Z23 Encounter for immunization: Secondary | ICD-10-CM | POA: Diagnosis not present

## 2019-04-07 DIAGNOSIS — R311 Benign essential microscopic hematuria: Secondary | ICD-10-CM | POA: Diagnosis not present

## 2019-04-07 DIAGNOSIS — N952 Postmenopausal atrophic vaginitis: Secondary | ICD-10-CM | POA: Diagnosis not present

## 2019-04-07 DIAGNOSIS — R1030 Lower abdominal pain, unspecified: Secondary | ICD-10-CM | POA: Diagnosis not present

## 2019-05-17 DIAGNOSIS — Z1231 Encounter for screening mammogram for malignant neoplasm of breast: Secondary | ICD-10-CM | POA: Diagnosis not present

## 2019-06-21 DIAGNOSIS — M1990 Unspecified osteoarthritis, unspecified site: Secondary | ICD-10-CM | POA: Diagnosis not present

## 2019-06-21 DIAGNOSIS — R634 Abnormal weight loss: Secondary | ICD-10-CM | POA: Diagnosis not present

## 2019-06-21 DIAGNOSIS — M2550 Pain in unspecified joint: Secondary | ICD-10-CM | POA: Diagnosis not present

## 2019-11-09 DIAGNOSIS — Z0000 Encounter for general adult medical examination without abnormal findings: Secondary | ICD-10-CM | POA: Diagnosis not present

## 2019-11-09 DIAGNOSIS — Z131 Encounter for screening for diabetes mellitus: Secondary | ICD-10-CM | POA: Diagnosis not present

## 2019-11-09 DIAGNOSIS — Z136 Encounter for screening for cardiovascular disorders: Secondary | ICD-10-CM | POA: Diagnosis not present

## 2019-11-09 DIAGNOSIS — M810 Age-related osteoporosis without current pathological fracture: Secondary | ICD-10-CM | POA: Diagnosis not present

## 2019-11-13 DIAGNOSIS — L259 Unspecified contact dermatitis, unspecified cause: Secondary | ICD-10-CM | POA: Diagnosis not present

## 2019-11-14 DIAGNOSIS — L255 Unspecified contact dermatitis due to plants, except food: Secondary | ICD-10-CM | POA: Diagnosis not present

## 2020-02-16 DIAGNOSIS — M25511 Pain in right shoulder: Secondary | ICD-10-CM | POA: Diagnosis not present

## 2020-02-16 DIAGNOSIS — M25512 Pain in left shoulder: Secondary | ICD-10-CM | POA: Diagnosis not present

## 2020-02-24 ENCOUNTER — Encounter: Payer: Self-pay | Admitting: Physical Therapy

## 2020-02-24 ENCOUNTER — Other Ambulatory Visit: Payer: Self-pay

## 2020-02-24 ENCOUNTER — Ambulatory Visit: Payer: Medicare Other | Attending: Orthopedic Surgery | Admitting: Physical Therapy

## 2020-02-24 DIAGNOSIS — M25611 Stiffness of right shoulder, not elsewhere classified: Secondary | ICD-10-CM | POA: Insufficient documentation

## 2020-02-24 DIAGNOSIS — M25512 Pain in left shoulder: Secondary | ICD-10-CM | POA: Insufficient documentation

## 2020-02-24 DIAGNOSIS — M25511 Pain in right shoulder: Secondary | ICD-10-CM | POA: Diagnosis not present

## 2020-02-24 NOTE — Therapy (Signed)
Marie Coleman [address], [place], [address] Phone: [phone]   Fax:  [phone]  Physical Therapy Evaluation  Patient Details  Name: Marie Coleman MRN: [id number] Date of Birth: Jun 01, 1941 Referring Provider (PT): Carlynn Spry Date: 02/24/2020   PT End of Session - 02/24/20 0912    Visit Number 1    Date for PT Re-Evaluation 04/25/20    PT Start Time 2694    PT Stop Time 0920    PT Time Calculation (min) 36 min    Activity Tolerance Patient tolerated treatment well    Behavior During Therapy [facility] for tasks assessed/performed           Past Medical History:  Diagnosis Date   Arthritis    OA AND PAIN LEFT KNEE AND PAIN IN SHOULDERS, HANDS, LOWER BACK   Osteoporosis    Seasonal allergies    Vertigo     Past Surgical History:  Procedure Laterality Date   ABDOMINAL HYSTERECTOMY  1977   HIP PINNING,CANNULATED  05/15/2011   Procedure: CANNULATED HIP PINNING;  Surgeon: Tobi Bastos;  Location: [facility];  Service: Orthopedics;  Laterality: Right;  Cannulated Right Hip Pinning   (c-arm)    LEFT ROTATOR CUFF REPAIR JAN 2012     LUMBAR SURGERY FOR HNP 1990'S     ORIF LEFT WRIST  1990'S     SURGERY FOR NECK SPURS 1980'S     TOTAL HIP REVISION  04/20/2012   Procedure: TOTAL HIP REVISION;  Surgeon: Gearlean Alf, MD;  Location: [facility];  Service: Orthopedics;  Laterality: Right;  CONVERSION OF  PREVIOUS HIP SURGERY   TOTAL KNEE ARTHROPLASTY  09/23/2011   Procedure: TOTAL KNEE ARTHROPLASTY;  Surgeon: Gearlean Alf, MD;  Location: [facility];  Service: Orthopedics;  Laterality: Left;    There were no vitals filed for this visit.    Subjective Assessment - 02/24/20 0848    Subjective Patient has had bilaeral RC repairs about 10 years ago.  She reports that she started having some right shoulder pain about 2 years ago, had to stop playing golf.  She reports that about 3 months ago she really  started having more pain, she went to the MD.  He gave cortisone injections, she reports that tthe pain is better.    Pertinent History bilateral RC repairs    Patient Stated Goals have betterROM, less pain, do hair and dress with less issues    Currently in Pain? Yes    Pain Score 1     Pain Location Shoulder    Pain Orientation Right;Left    Pain Descriptors / Indicators Sharp;Aching    Pain Type Acute pain    Pain Onset More than a month ago    Aggravating Factors  reaching out/up. lifting, dressing and doing hair, could not carry groceries pain up to 10/10    Pain Relieving Factors rest, not moving pain was a 2/10, since the injections the pain is a 1/10 at best    Effect of Pain on Daily Activities difficulty with all ADL's              [facility] PT Assessment - 02/24/20 0001      Assessment   Medical Diagnosis bilateral shoulder pain    Referring Provider (PT) Veverly Fells    Onset Date/Surgical Date 02/17/20    Hand Dominance Right    Prior Therapy not recently      Precautions  Precautions None      Balance Screen   Has the patient fallen in the past 6 months No    Has the patient had a decrease in activity level because of a fear of falling?  No    Is the patient reluctant to leave their home because of a fear of falling?  No      Home Environment   Additional Comments does housework, has been unable to do yardwork and gardening  due to pain      Prior Function   Level of Independence Independent    Vocation Retired    Leisure no exercise      Posture/Postural Control   Posture Comments mild forward head and rounded shoulders      ROM / Strength   AROM / PROM / Strength AROM;PROM;Strength      AROM   AROM Assessment Site Shoulder    Right/Left Shoulder Right;Left    Right Shoulder Flexion 130 Degrees    Right Shoulder ABduction 120 Degrees    Right Shoulder Internal Rotation 40 Degrees    Right Shoulder External Rotation 75 Degrees    Left Shoulder Flexion 142  Degrees    Left Shoulder ABduction 140 Degrees    Left Shoulder Internal Rotation 61 Degrees    Left Shoulder External Rotation 80 Degrees      PROM   Overall PROM Comments all PROM was painful at end range    PROM Assessment Site Shoulder    Right/Left Shoulder Right;Left    Right Shoulder Flexion 150 Degrees    Right Shoulder ABduction 128 Degrees    Right Shoulder Internal Rotation 45 Degrees    Right Shoulder External Rotation 80 Degrees    Left Shoulder Flexion 155 Degrees    Left Shoulder ABduction 150 Degrees    Left Shoulder Internal Rotation 70 Degrees    Left Shoulder External Rotation 85 Degrees      Strength   Overall Strength Comments shoulders 4-/5 for all motions with some increase of pain      Palpation   Palpation comment non tender in the shoulders, has some scapular winging                      Objective measurements completed on examination: See above findings.                 PT Short Term Goals - 02/24/20 0915      PT SHORT TERM GOAL #1   Title independent with initial HEP    Time 2    Period Weeks    Status New             PT Long Term Goals - 02/24/20 0916      PT LONG TERM GOAL #1   Title dress and do hair without any pain or difficulty    Time 8    Period Weeks    Status New      PT LONG TERM GOAL #2   Title understand posture and body mechanics    Time 8    Period Weeks    Status New      PT LONG TERM GOAL #3   Title increase right shoulder AROM to = the left    Time 8    Period Weeks    Status New      PT LONG TERM GOAL #4   Title decrease pain 50% for ADL's    Time 8  Period Weeks    Status New                  Plan - 02/24/20 0912    Clinical Impression Statement Patient reports that for the past two years she has had increasing shoulder pain right > left.  Has a hx of bilateral RC repairs about 10 years ago.  She reports that recently she had to stop golf, yardwork and was unable  to reach into cabinets and carry groceries, difficulty dressing and doing hair.  She had corisone injections last week and reports much less pain.  Her ROM was fair, good strength still with pain at end ranges but pain rated a 4/10 today compared to her saying a 10/10 prior to the injecitons.  It appears that she has a lot of weakness in teh scapular area    Examination-Activity Limitations Reach Overhead;Carry;Dressing;Lift    Stability/Clinical Decision Making Stable/Uncomplicated    Clinical Decision Making Low    Rehab Potential Good    PT Frequency 2x / week    PT Duration 8 weeks    PT Treatment/Interventions ADLs/Self Care Home Management;Electrical Stimulation;Cryotherapy;Iontophoresis 4mg /ml Dexamethasone;Moist Heat;Ultrasound;Therapeutic exercise;Therapeutic activities;Neuromuscular re-education;Manual techniques;Patient/family education    PT Next Visit Plan work on scapular stability    Consulted and Agree with Plan of Care Patient           Patient will benefit from skilled therapeutic intervention in order to improve the following deficits and impairments:  Decreased range of motion, Impaired UE functional use, Increased muscle spasms, Pain, Improper body mechanics, Decreased strength, Postural dysfunction  Visit Diagnosis: Acute pain of right shoulder - Plan: PT plan of care cert/re-cert  Stiffness of right shoulder, not elsewhere classified - Plan: PT plan of care cert/re-cert  Acute pain of left shoulder - Plan: PT plan of care cert/re-cert     Problem List Patient Active Problem List   Diagnosis Date Noted   Avascular necrosis of bone of hip ([place]) 04/20/2012   Postop Transfusion  09/26/2011   Postop Hyponatremia 09/26/2011   Postop Acute blood loss anemia 09/25/2011   OA (osteoarthritis) of knee 09/23/2011   Hip fracture, right ([place]) 05/14/2011    Class: Acute    [name], PT 02/24/2020, 9:18 AM  [address] [place], [place], [address] Phone: [phone]   Fax:  [phone]  Name: OLA RAAP MRN: [id number] Date of Birth: 06/29/1940

## 2020-02-24 NOTE — Patient Instructions (Signed)
Access Code: [email or web address] URL: [email or web address] Date: 02/24/2020 Prepared by: Lum Babe  Exercises Shoulder extension with resistance - Neutral - 2 x daily - 7 x weekly - 1 sets - 10 reps - 3 hold Shoulder Extension with Resistance - Palms Forward - 2 x daily - 7 x weekly - 1 sets - 10 reps - 3 hold Standing Bilateral Low Shoulder Row with Anchored Resistance - 2 x daily - 7 x weekly - 1 sets - 10 reps - 3 hold

## 2020-02-29 ENCOUNTER — Other Ambulatory Visit: Payer: Self-pay

## 2020-02-29 ENCOUNTER — Ambulatory Visit: Payer: Medicare Other

## 2020-02-29 DIAGNOSIS — M25611 Stiffness of right shoulder, not elsewhere classified: Secondary | ICD-10-CM

## 2020-02-29 DIAGNOSIS — M25511 Pain in right shoulder: Secondary | ICD-10-CM

## 2020-02-29 DIAGNOSIS — M25512 Pain in left shoulder: Secondary | ICD-10-CM

## 2020-02-29 NOTE — Therapy (Signed)
[place] [place], [place], [address] Phone: [phone]   Fax:  [phone]  Physical Therapy Treatment  Patient Details  Name: Marie Coleman MRN: [id number] Date of Birth: 10/10/1940 Referring Provider (PT): Carlynn Spry Date: 02/29/2020   PT End of Session - 02/29/20 1625    Date for PT Re-Evaluation 04/25/20    PT Start Time 1620    PT Stop Time 1703    PT Time Calculation (min) 43 min    Activity Tolerance Patient tolerated treatment well    Behavior During Therapy [facility] for tasks assessed/performed           Past Medical History:  Diagnosis Date  . Arthritis    OA AND PAIN LEFT KNEE AND PAIN IN SHOULDERS, HANDS, LOWER BACK  . Osteoporosis   . Seasonal allergies   . Vertigo     Past Surgical History:  Procedure Laterality Date  . ABDOMINAL HYSTERECTOMY  1977  . HIP PINNING,CANNULATED  05/15/2011   Procedure: CANNULATED HIP PINNING;  Surgeon: Tobi Bastos;  Location: [facility];  Service: Orthopedics;  Laterality: Right;  Cannulated Right Hip Pinning   (c-arm)   . LEFT ROTATOR CUFF REPAIR JAN 2012    . LUMBAR SURGERY FOR HNP 1990'S    . ORIF LEFT WRIST  1990'S    . SURGERY FOR NECK SPURS 1980'S    . TOTAL HIP REVISION  04/20/2012   Procedure: TOTAL HIP REVISION;  Surgeon: Gearlean Alf, MD;  Location: [facility];  Service: Orthopedics;  Laterality: Right;  CONVERSION OF  PREVIOUS HIP SURGERY  . TOTAL KNEE ARTHROPLASTY  09/23/2011   Procedure: TOTAL KNEE ARTHROPLASTY;  Surgeon: Gearlean Alf, MD;  Location[facility];  Service: Orthopedics;  Laterality: Left;    There were no vitals filed for this visit.   Subjective Assessment - 02/29/20 1623    Subjective Pt reports she is working hard at her exercises and doing better. She experiences pain intermittently when she is in certain positions, like reaching back or up.    Pertinent History bilateral RC repairs    Patient Stated Goals have  better ROM, less pain, do hair and dress with less issues    Currently in Pain? No/denies    Pain Onset More than a month ago                             [facility] Adult PT Treatment/Exercise - 02/29/20 0001      Exercises   Exercises Shoulder      Shoulder Exercises: Supine   Other Supine Exercises Extension over yoga mat with HBH in H/L      Shoulder Exercises: Standing   External Rotation Both;10 reps;Theraband    Theraband Level (Shoulder External Rotation) Level 2 (Red)    External Rotation Limitations 3" hold    Extension Both;12 reps;Theraband    Theraband Level (Shoulder Extension) Level 2 (Red)    Extension Limitations palms fwd and neutral    Row Both;12 reps;Theraband    Theraband Level (Shoulder Row) Level 2 (Red)      Shoulder Exercises: ROM/Strengthening   UBE (Upper Arm Bike) L 3 3' fwd/3' bkwd    Nustep L 7 4 min UE/LE   some R shoulder pain around 3 min 15 seconds     Shoulder Exercises: Stretch   Other Shoulder Stretches Lunge biceps/pec S at wall,  pec 1 S in lunge                    PT Short Term Goals - 02/24/20 0915      PT SHORT TERM GOAL #1   Title independent with initial HEP    Time 2    Period Weeks    Status New             PT Long Term Goals - 02/24/20 0916      PT LONG TERM GOAL #1   Title dress and do hair without any pain or difficulty    Time 8    Period Weeks    Status New      PT LONG TERM GOAL #2   Title understand posture and body mechanics    Time 8    Period Weeks    Status New      PT LONG TERM GOAL #3   Title increase right shoulder AROM to = the left    Time 8    Period Weeks    Status New      PT LONG TERM GOAL #4   Title decrease pain 50% for ADL's    Time 8    Period Weeks    Status New                 Plan - 02/29/20 1626    Clinical Impression Statement Pt tolerated tx well with increase in postural strength and endurance. She has a lot of right shoulder hiking and  anterior translation with max correction during session. Pt fatigued by end of session with report her muscles would be sore tomorrow.    Examination-Activity Limitations Reach Overhead;Carry;Dressing;Lift    Stability/Clinical Decision Making Stable/Uncomplicated    Rehab Potential Good    PT Frequency 2x / week    PT Duration 8 weeks    PT Treatment/Interventions ADLs/Self Care Home Management;Electrical Stimulation;Cryotherapy;Iontophoresis 4mg /ml Dexamethasone;Moist Heat;Ultrasound;Therapeutic exercise;Therapeutic activities;Neuromuscular re-education;Manual techniques;Patient/family education    PT Next Visit Plan work on scapular stability    Consulted and Agree with Plan of Care Patient           Patient will benefit from skilled therapeutic intervention in order to improve the following deficits and impairments:  Decreased range of motion, Impaired UE functional use, Increased muscle spasms, Pain, Improper body mechanics, Decreased strength, Postural dysfunction  Visit Diagnosis: Acute pain of right shoulder  Stiffness of right shoulder, not elsewhere classified  Acute pain of left shoulder     Problem List Patient Active Problem List   Diagnosis Date Noted  . Avascular necrosis of bone of hip ([place]) 04/20/2012  . Postop Transfusion  09/26/2011  . Postop Hyponatremia 09/26/2011  . Postop Acute blood loss anemia 09/25/2011  . OA (osteoarthritis) of knee 09/23/2011  . Hip fracture, right ([place]) 05/14/2011    Class: Acute    Izell [place], PT, DPT 02/29/2020, 5:45 PM  [place] [address] [place], [place], [address] Phone: [phone]   Fax:  [phone]  Name: Marie Coleman MRN: [id number] Date of Birth: 02/05/1941

## 2020-03-01 DIAGNOSIS — M25572 Pain in left ankle and joints of left foot: Secondary | ICD-10-CM | POA: Diagnosis not present

## 2020-03-01 DIAGNOSIS — R0781 Pleurodynia: Secondary | ICD-10-CM | POA: Diagnosis not present

## 2020-03-01 DIAGNOSIS — M25562 Pain in left knee: Secondary | ICD-10-CM | POA: Diagnosis not present

## 2020-03-01 DIAGNOSIS — M25512 Pain in left shoulder: Secondary | ICD-10-CM | POA: Diagnosis not present

## 2020-03-06 ENCOUNTER — Encounter: Payer: Medicare Other | Admitting: Physical Therapy

## 2020-03-08 DIAGNOSIS — M25512 Pain in left shoulder: Secondary | ICD-10-CM | POA: Diagnosis not present

## 2020-03-08 DIAGNOSIS — M545 Low back pain: Secondary | ICD-10-CM | POA: Diagnosis not present

## 2020-03-08 DIAGNOSIS — R278 Other lack of coordination: Secondary | ICD-10-CM | POA: Diagnosis not present

## 2020-03-08 DIAGNOSIS — R102 Pelvic and perineal pain: Secondary | ICD-10-CM | POA: Diagnosis not present

## 2020-03-08 DIAGNOSIS — R0781 Pleurodynia: Secondary | ICD-10-CM | POA: Diagnosis not present

## 2020-03-08 DIAGNOSIS — M25572 Pain in left ankle and joints of left foot: Secondary | ICD-10-CM | POA: Diagnosis not present

## 2020-03-09 ENCOUNTER — Ambulatory Visit: Payer: Medicare Other | Admitting: Physical Therapy

## 2020-03-11 DIAGNOSIS — Z23 Encounter for immunization: Secondary | ICD-10-CM | POA: Diagnosis not present

## 2020-03-24 DIAGNOSIS — R269 Unspecified abnormalities of gait and mobility: Secondary | ICD-10-CM | POA: Diagnosis not present

## 2020-03-31 DIAGNOSIS — R269 Unspecified abnormalities of gait and mobility: Secondary | ICD-10-CM | POA: Diagnosis not present

## 2020-04-19 DIAGNOSIS — M6530 Trigger finger, unspecified finger: Secondary | ICD-10-CM | POA: Diagnosis not present

## 2020-04-19 DIAGNOSIS — M1990 Unspecified osteoarthritis, unspecified site: Secondary | ICD-10-CM | POA: Diagnosis not present

## 2020-04-19 DIAGNOSIS — G250 Essential tremor: Secondary | ICD-10-CM | POA: Diagnosis not present

## 2020-04-19 DIAGNOSIS — H8110 Benign paroxysmal vertigo, unspecified ear: Secondary | ICD-10-CM | POA: Diagnosis not present

## 2020-04-22 DIAGNOSIS — Z23 Encounter for immunization: Secondary | ICD-10-CM | POA: Diagnosis not present

## 2020-06-29 DIAGNOSIS — M810 Age-related osteoporosis without current pathological fracture: Secondary | ICD-10-CM | POA: Diagnosis not present

## 2020-06-29 DIAGNOSIS — Z01419 Encounter for gynecological examination (general) (routine) without abnormal findings: Secondary | ICD-10-CM | POA: Diagnosis not present

## 2020-06-29 DIAGNOSIS — Z681 Body mass index (BMI) 19 or less, adult: Secondary | ICD-10-CM | POA: Diagnosis not present

## 2020-07-25 DIAGNOSIS — H52203 Unspecified astigmatism, bilateral: Secondary | ICD-10-CM | POA: Diagnosis not present

## 2020-07-25 DIAGNOSIS — H26492 Other secondary cataract, left eye: Secondary | ICD-10-CM | POA: Diagnosis not present

## 2020-07-25 DIAGNOSIS — Z961 Presence of intraocular lens: Secondary | ICD-10-CM | POA: Diagnosis not present

## 2020-08-21 DIAGNOSIS — M5459 Other low back pain: Secondary | ICD-10-CM | POA: Diagnosis not present

## 2020-08-21 DIAGNOSIS — M25562 Pain in left knee: Secondary | ICD-10-CM | POA: Diagnosis not present

## 2020-09-25 DIAGNOSIS — M5136 Other intervertebral disc degeneration, lumbar region: Secondary | ICD-10-CM | POA: Diagnosis not present

## 2020-10-05 DIAGNOSIS — H04222 Epiphora due to insufficient drainage, left lacrimal gland: Secondary | ICD-10-CM | POA: Diagnosis not present

## 2020-10-05 DIAGNOSIS — H02135 Senile ectropion of left lower eyelid: Secondary | ICD-10-CM | POA: Diagnosis not present

## 2020-10-05 DIAGNOSIS — H02112 Cicatricial ectropion of right lower eyelid: Secondary | ICD-10-CM | POA: Diagnosis not present

## 2020-10-05 DIAGNOSIS — Z961 Presence of intraocular lens: Secondary | ICD-10-CM | POA: Diagnosis not present

## 2020-10-05 DIAGNOSIS — H04522 Eversion of left lacrimal punctum: Secondary | ICD-10-CM | POA: Diagnosis not present

## 2020-10-05 DIAGNOSIS — H02132 Senile ectropion of right lower eyelid: Secondary | ICD-10-CM | POA: Diagnosis not present

## 2020-10-05 DIAGNOSIS — H02115 Cicatricial ectropion of left lower eyelid: Secondary | ICD-10-CM | POA: Diagnosis not present

## 2020-10-18 DIAGNOSIS — R159 Full incontinence of feces: Secondary | ICD-10-CM | POA: Diagnosis not present

## 2020-10-18 DIAGNOSIS — Z8349 Family history of other endocrine, nutritional and metabolic diseases: Secondary | ICD-10-CM | POA: Diagnosis not present

## 2020-10-27 DIAGNOSIS — Z23 Encounter for immunization: Secondary | ICD-10-CM | POA: Diagnosis not present

## 2020-11-17 DIAGNOSIS — Z1389 Encounter for screening for other disorder: Secondary | ICD-10-CM | POA: Diagnosis not present

## 2020-11-17 DIAGNOSIS — Z0000 Encounter for general adult medical examination without abnormal findings: Secondary | ICD-10-CM | POA: Diagnosis not present

## 2020-11-23 DIAGNOSIS — L570 Actinic keratosis: Secondary | ICD-10-CM | POA: Diagnosis not present

## 2020-11-23 DIAGNOSIS — Z85828 Personal history of other malignant neoplasm of skin: Secondary | ICD-10-CM | POA: Diagnosis not present

## 2020-11-30 DIAGNOSIS — Z96652 Presence of left artificial knee joint: Secondary | ICD-10-CM | POA: Diagnosis not present

## 2020-11-30 DIAGNOSIS — M25561 Pain in right knee: Secondary | ICD-10-CM | POA: Diagnosis not present

## 2020-11-30 DIAGNOSIS — M25562 Pain in left knee: Secondary | ICD-10-CM | POA: Diagnosis not present

## 2020-12-13 DIAGNOSIS — Z13220 Encounter for screening for lipoid disorders: Secondary | ICD-10-CM | POA: Diagnosis not present

## 2020-12-13 DIAGNOSIS — G250 Essential tremor: Secondary | ICD-10-CM | POA: Diagnosis not present

## 2020-12-13 DIAGNOSIS — R634 Abnormal weight loss: Secondary | ICD-10-CM | POA: Diagnosis not present

## 2020-12-13 DIAGNOSIS — M810 Age-related osteoporosis without current pathological fracture: Secondary | ICD-10-CM | POA: Diagnosis not present

## 2020-12-19 ENCOUNTER — Other Ambulatory Visit: Payer: Self-pay | Admitting: Family Medicine

## 2020-12-19 DIAGNOSIS — R634 Abnormal weight loss: Secondary | ICD-10-CM

## 2020-12-19 DIAGNOSIS — M545 Low back pain, unspecified: Secondary | ICD-10-CM | POA: Diagnosis not present

## 2020-12-25 ENCOUNTER — Ambulatory Visit: Payer: Medicare Other | Admitting: Neurology

## 2021-01-01 ENCOUNTER — Encounter: Payer: Self-pay | Admitting: Physical Therapy

## 2021-01-01 ENCOUNTER — Ambulatory Visit: Payer: Medicare Other | Attending: Orthopedic Surgery | Admitting: Physical Therapy

## 2021-01-01 ENCOUNTER — Other Ambulatory Visit: Payer: Self-pay

## 2021-01-01 DIAGNOSIS — M25561 Pain in right knee: Secondary | ICD-10-CM | POA: Insufficient documentation

## 2021-01-01 DIAGNOSIS — M6281 Muscle weakness (generalized): Secondary | ICD-10-CM | POA: Diagnosis not present

## 2021-01-01 DIAGNOSIS — R262 Difficulty in walking, not elsewhere classified: Secondary | ICD-10-CM | POA: Insufficient documentation

## 2021-01-01 DIAGNOSIS — R2681 Unsteadiness on feet: Secondary | ICD-10-CM | POA: Diagnosis not present

## 2021-01-01 DIAGNOSIS — M545 Low back pain, unspecified: Secondary | ICD-10-CM | POA: Insufficient documentation

## 2021-01-01 DIAGNOSIS — M25562 Pain in left knee: Secondary | ICD-10-CM | POA: Insufficient documentation

## 2021-01-01 NOTE — Therapy (Signed)
[place] [place], [place], [address] Phone: [phone]   Fax:  [phone]  Physical Therapy Evaluation  Patient Details  Name: Marie Coleman MRN: [id number] Date of Birth: 07/22/1940 Referring Provider (PT): Rolena Infante   Encounter Date: 01/01/2021   PT End of Session - 01/01/21 1228     Visit Number 1    Date for PT Re-Evaluation 04/03/21    PT Start Time 0830    PT Stop Time 0911    PT Time Calculation (min) 41 min    Activity Tolerance Patient tolerated treatment well    Behavior During Therapy [facility] for tasks assessed/performed             Past Medical History:  Diagnosis Date   Arthritis    OA AND PAIN LEFT KNEE AND PAIN IN SHOULDERS, HANDS, LOWER BACK   Osteoporosis    Seasonal allergies    Vertigo     Past Surgical History:  Procedure Laterality Date   ABDOMINAL HYSTERECTOMY  1977   HIP PINNING,CANNULATED  05/15/2011   Procedure: CANNULATED HIP PINNING;  Surgeon: Tobi Bastos;  [facility];  Service: Orthopedics;  Laterality: Right;  Cannulated Right Hip Pinning   (c-arm)    LEFT ROTATOR CUFF REPAIR JAN 2012     LUMBAR SURGERY FOR HNP 1990'S     ORIF LEFT WRIST  1990'S     SURGERY FOR NECK SPURS 1980'S     TOTAL HIP REVISION  04/20/2012   Procedure: TOTAL HIP REVISION;  Surgeon: Gearlean Alf, MD;  [facility];  Service: Orthopedics;  Laterality: Right;  CONVERSION OF  PREVIOUS HIP SURGERY   TOTAL KNEE ARTHROPLASTY  09/23/2011   Procedure: TOTAL KNEE ARTHROPLASTY;  Surgeon: Gearlean Alf, MD;  [facility];  Service: Orthopedics;  Laterality: Left;    There were no vitals filed for this visit.    Subjective Assessment - 01/01/21 1019     Subjective Patient reports that over the past 9 months she feels like she has had some increased LBP, knee pain and weakness, had a fall about 9 months ago, she report sthat she feels shaky, has not been able to do any yardwork.  She  reports feeling scared and a little anxious about walking, has stopped walking for exercises. X-rays show DDD and arthritis    Pertinent History bilateral RC repairs, left TKA, right hip ORIF    Patient Stated Goals feel stronger, less pain, be able to do more    Currently in Pain? Yes    Pain Score 3     Pain Location Back   knees   Pain Orientation Lower    Pain Descriptors / Indicators Aching;Spasm;Tightness    Pain Type Acute pain    Pain Onset More than a month ago    Pain Frequency Intermittent    Aggravating Factors  bending, walking, standing pain up to 8/10    Pain Relieving Factors rest can be 0/10    Effect of Pain on Daily Activities fear of walking, pain, limited ADL's                [facility] PT Assessment - 01/01/21 0001       Assessment   Medical Diagnosis LBP, weakness, poor balance, knee pain    Referring Provider (PT) Rolena Infante    Onset Date/Surgical Date 12/02/20    Hand Dominance Right    Prior Therapy not recently  Precautions   Precautions None      Balance Screen   Has the patient fallen in the past 6 months No    Has the patient had a decrease in activity level because of a fear of falling?  Yes    Is the patient reluctant to leave their home because of a fear of falling?  No      Home Environment   Additional Comments does housework, has been unable to do yardwork and gardening  due to pain and fear of falls      Prior Function   Level of Independence Independent    Vocation Retired    Leisure was walking some but stopped due to fear and knee pain      Posture/Postural Control   Posture Comments mild forward head and rounded shoulders      AROM   Overall AROM Comments Lumbar flexion decreased 50%, extensiona nd side bending decreased 75%, knee AROM 10-100 degrees flexion with some pain      Strength   Overall Strength Comments knee flexion 4-/5, knee extension 3+/5, hip flexion 4/5, abduction 4-/5      Flexibility   Soft Tissue  Assessment /Muscle Length yes    Hamstrings tight    Piriformis tight      Transfers   Comments can get up without using hands the knees collapse into valgus      Ambulation/Gait   Gait Comments no device, small slow shuffling steps, decreased arm swing on the right      Standardized Balance Assessment   Standardized Balance Assessment Timed Up and Go Test;Berg Balance Test      Berg Balance Test   Sit to Stand Able to stand without using hands and stabilize independently    Standing Unsupported Able to stand safely 2 minutes    Sitting with Back Unsupported but Feet Supported on Floor or Stool Able to sit safely and securely 2 minutes    Stand to Sit Sits safely with minimal use of hands    Transfers Able to transfer safely, minor use of hands    Standing Unsupported with Eyes Closed Able to stand 10 seconds with supervision    Standing Unsupported with Feet Together Able to place feet together independently and stand 1 minute safely    From Standing, Reach Forward with Outstretched Arm Can reach forward >12 cm safely (5")    From Standing Position, Pick up Object from Floor Able to pick up shoe safely and easily    From Standing Position, Turn to Look Behind Over each Shoulder Looks behind from both sides and weight shifts well    Turn 360 Degrees Able to turn 360 degrees safely one side only in 4 seconds or less    Standing Unsupported, Alternately Place Feet on Step/Stool Able to stand independently and safely and complete 8 steps in 20 seconds    Standing Unsupported, One Foot in Front Able to take small step independently and hold 30 seconds    Standing on One Leg Able to lift leg independently and hold 5-10 seconds    Total Score 50      Timed Up and Go Test   Normal TUG (seconds) 13                        Objective measurements completed on examination: See above findings.               PT Education -  01/01/21 1228     Education Details gave  Wms flexion exercises for the LBP    Person(s) Educated Patient    Methods Explanation;Demonstration;Handout    Comprehension Verbalized understanding              PT Short Term Goals - 01/01/21 1250       PT SHORT TERM GOAL #1   Time 2    Period Weeks    Status New               PT Long Term Goals - 01/01/21 1251       PT LONG TERM GOAL #1   Title cook a meal without pain or fatigue    Time 12    Period Weeks    Status New      PT LONG TERM GOAL #2   Title understand posture and body mechanics    Time 12    Period Weeks    Status New      PT LONG TERM GOAL #3   Title increase lumbar ROM 25%    Time 12    Period Weeks    Status New      PT LONG TERM GOAL #4   Title decrease pain 50% for ADL's    Time 12    Period Weeks    Status New      PT LONG TERM GOAL #5   Title resume walking around the block without fear    Time 12    Period Weeks    Status New                    Plan - 01/01/21 1238     Clinical Impression Statement Patient reports a fall back in the fall, she reports that she has regressed since that time, reports back pain, difficulty walking, has stopped doing any yardwork, reports a fear of falling.  She reports some difficulty shopping.  She has some knee pain.  She did okay with the balance activtiies however she is very fearful of falling and tended to want to hold onto the wall.  She has limited ROM of the knees and back.  She reports feeling weak and shaky the more she does    Stability/Clinical Decision Making Stable/Uncomplicated    Clinical Decision Making Low    Rehab Potential Good    PT Frequency 2x / week    PT Duration 12 weeks    PT Treatment/Interventions ADLs/Self Care Home Management;Electrical Stimulation;Cryotherapy;Iontophoresis 4mg /ml Dexamethasone;Moist Heat;Therapeutic exercise;Therapeutic activities;Neuromuscular re-education;Manual techniques;Patient/family education;Functional mobility training;Stair  training;Balance training    PT Next Visit Plan work on strength, balance and function    Consulted and Agree with Plan of Care Patient             Patient will benefit from skilled therapeutic intervention in order to improve the following deficits and impairments:  Decreased range of motion, Difficulty walking, Increased muscle spasms, Decreased endurance, Decreased activity tolerance, Pain, Decreased balance, Decreased mobility, Decreased strength, Postural dysfunction  Visit Diagnosis: Acute bilateral low back pain without sciatica - Plan: PT plan of care cert/re-cert  Acute pain of left knee - Plan: PT plan of care cert/re-cert  Acute pain of right knee - Plan: PT plan of care cert/re-cert  Difficulty in walking, not elsewhere classified - Plan: PT plan of care cert/re-cert  Muscle weakness (generalized) - Plan: PT plan of care cert/re-cert  Unsteadiness on feet - Plan: PT plan of care cert/re-cert  Problem List Patient Active Problem List   Diagnosis Date Noted   Avascular necrosis of bone of hip ([place]) 04/20/2012   Postop Transfusion  09/26/2011   Postop Hyponatremia 09/26/2011   Postop Acute blood loss anemia 09/25/2011   OA (osteoarthritis) of knee 09/23/2011   Hip fracture, right ([place]) 05/14/2011    Class: Acute    [name], PT 01/01/2021, 12:55 PM  [place]. [place], [place], [address] Phone: [phone]   Fax:  [phone]  Name: Marie Coleman MRN: [id number] Date of Birth: 1941/05/28

## 2021-01-02 DIAGNOSIS — H26492 Other secondary cataract, left eye: Secondary | ICD-10-CM | POA: Diagnosis not present

## 2021-01-02 DIAGNOSIS — H02105 Unspecified ectropion of left lower eyelid: Secondary | ICD-10-CM | POA: Diagnosis not present

## 2021-01-02 DIAGNOSIS — H52203 Unspecified astigmatism, bilateral: Secondary | ICD-10-CM | POA: Diagnosis not present

## 2021-01-03 ENCOUNTER — Encounter: Payer: Self-pay | Admitting: Physical Therapy

## 2021-01-03 ENCOUNTER — Other Ambulatory Visit: Payer: Self-pay

## 2021-01-03 ENCOUNTER — Ambulatory Visit: Payer: Medicare Other | Admitting: Physical Therapy

## 2021-01-03 DIAGNOSIS — M25562 Pain in left knee: Secondary | ICD-10-CM | POA: Diagnosis not present

## 2021-01-03 DIAGNOSIS — R2681 Unsteadiness on feet: Secondary | ICD-10-CM | POA: Diagnosis not present

## 2021-01-03 DIAGNOSIS — M6281 Muscle weakness (generalized): Secondary | ICD-10-CM

## 2021-01-03 DIAGNOSIS — R262 Difficulty in walking, not elsewhere classified: Secondary | ICD-10-CM

## 2021-01-03 DIAGNOSIS — M25561 Pain in right knee: Secondary | ICD-10-CM

## 2021-01-03 DIAGNOSIS — M545 Low back pain, unspecified: Secondary | ICD-10-CM | POA: Diagnosis not present

## 2021-01-03 NOTE — Therapy (Signed)
[name] [place], [place], [address] Phone: [phone]   Fax:  [phone]  Physical Therapy Treatment  Patient Details  Name: Marie Coleman MRN: [id number] Date of Birth: [birth year] Referring Provider (PT): Rolena Infante   Encounter Date: 01/03/2021   PT End of Session - 01/03/21 0922     Visit Number 2    Date for PT Re-Evaluation 04/03/21    PT Start Time 0843    PT Stop Time 0925    PT Time Calculation (min) 42 min    Activity Tolerance Patient tolerated treatment well    Behavior During Therapy [facility] for tasks assessed/performed             Past Medical History:  Diagnosis Date   Arthritis    OA AND PAIN LEFT KNEE AND PAIN IN SHOULDERS, HANDS, LOWER BACK   Osteoporosis    Seasonal allergies    Vertigo     Past Surgical History:  Procedure Laterality Date   ABDOMINAL HYSTERECTOMY  1977   HIP PINNING,CANNULATED  05/15/2011   Procedure: CANNULATED HIP PINNING;  Surgeon: Tobi Bastos;  [facility];  Service: Orthopedics;  Laterality: Right;  Cannulated Right Hip Pinning   (c-arm)    LEFT ROTATOR CUFF REPAIR JAN 2012     LUMBAR SURGERY FOR HNP 1990'S     ORIF LEFT WRIST  1990'S     SURGERY FOR NECK SPURS 1980'S     TOTAL HIP REVISION  04/20/2012   Procedure: TOTAL HIP REVISION;  Surgeon: Gearlean Alf, MD;  [facility];  Service: Orthopedics;  Laterality: Right;  CONVERSION OF  PREVIOUS HIP SURGERY   TOTAL KNEE ARTHROPLASTY  09/23/2011   Procedure: TOTAL KNEE ARTHROPLASTY;  Surgeon: Gearlean Alf, MD;  [facility];  Service: Orthopedics;  Laterality: Left;    There were no vitals filed for this visit.   Subjective Assessment - 01/03/21 0846     Subjective A little tired , tired the HEP 1x    Currently in Pain? Yes    Pain Score 3     Pain Location Back                               [facility] Adult PT Treatment/Exercise - 01/03/21 0001       High Level  Balance   High Level Balance Comments alternating foot cone toe touches, on airex ball toss, on airex 6" tope touches      Exercises   Exercises Lumbar      Lumbar Exercises: Stretches   Passive Hamstring Stretch Right;Left;3 reps;20 seconds    Piriformis Stretch Right;Left;3 reps;20 seconds      Lumbar Exercises: Aerobic   Nustep level 4 x 6 minutes      Lumbar Exercises: Machines for Strengthening   Cybex Lumbar Extension 5# 2x10    Cybex Knee Extension 15# 2x10      Lumbar Exercises: Standing   Other Standing Lumbar Exercises red tband rows and extension 2x10 a lot of cues needed      Lumbar Exercises: Supine   Other Supine Lumbar Exercises feet on ball K2C, trunk rotation, small bridge and isometric abs                      PT Short Term Goals - 01/01/21 1250       PT SHORT TERM GOAL #1  Time 2    Period Weeks    Status New               PT Long Term Goals - 01/01/21 1251       PT LONG TERM GOAL #1   Title cook a meal without pain or fatigue    Time 12    Period Weeks    Status New      PT LONG TERM GOAL #2   Title understand posture and body mechanics    Time 12    Period Weeks    Status New      PT LONG TERM GOAL #3   Title increase lumbar ROM 25%    Time 12    Period Weeks    Status New      PT LONG TERM GOAL #4   Title decrease pain 50% for ADL's    Time 12    Period Weeks    Status New      PT LONG TERM GOAL #5   Title resume walking around the block without fear    Time 12    Period Weeks    Status New                   Plan - 01/03/21 1607     Clinical Impression Statement Initiated strength, and balance activities, she fatigues easily with the strength, needs a lot of cues to get TKE and activate the core as well as for posture.  HS are very tight, the airex standing did bother her    PT Next Visit Plan work on strength, balance and function    Consulted and Agree with Plan of Care Patient              Patient will benefit from skilled therapeutic intervention in order to improve the following deficits and impairments:  Decreased range of motion, Difficulty walking, Increased muscle spasms, Decreased endurance, Decreased activity tolerance, Pain, Decreased balance, Decreased mobility, Decreased strength, Postural dysfunction  Visit Diagnosis: Acute bilateral low back pain without sciatica  Acute pain of left knee  Acute pain of right knee  Difficulty in walking, not elsewhere classified  Muscle weakness (generalized)  Unsteadiness on feet     Problem List Patient Active Problem List   Diagnosis Date Noted   Avascular necrosis of bone of hip ([place]) 04/20/2012   Postop Transfusion  09/26/2011   Postop Hyponatremia 09/26/2011   Postop Acute blood loss anemia 09/25/2011   OA (osteoarthritis) of knee 09/23/2011   Hip fracture, right ([place]) 05/14/2011    Class: Acute    [name], PT 01/03/2021, 9:25 AM  [place]. [place], [place], [address] Phone: [phone]   Fax:  [phone]  Name: Marie Coleman MRN: [id number] Date of Birth: Nov 26, 1940

## 2021-01-08 ENCOUNTER — Encounter: Payer: Self-pay | Admitting: Physical Therapy

## 2021-01-08 ENCOUNTER — Ambulatory Visit: Payer: Medicare Other | Admitting: Physical Therapy

## 2021-01-08 ENCOUNTER — Other Ambulatory Visit: Payer: Self-pay

## 2021-01-08 DIAGNOSIS — R262 Difficulty in walking, not elsewhere classified: Secondary | ICD-10-CM

## 2021-01-08 DIAGNOSIS — M25561 Pain in right knee: Secondary | ICD-10-CM | POA: Diagnosis not present

## 2021-01-08 DIAGNOSIS — M6281 Muscle weakness (generalized): Secondary | ICD-10-CM

## 2021-01-08 DIAGNOSIS — M545 Low back pain, unspecified: Secondary | ICD-10-CM | POA: Diagnosis not present

## 2021-01-08 DIAGNOSIS — M25562 Pain in left knee: Secondary | ICD-10-CM

## 2021-01-08 DIAGNOSIS — R2681 Unsteadiness on feet: Secondary | ICD-10-CM

## 2021-01-08 NOTE — Therapy (Signed)
[place]. [place], [place], [address] Phone: [phone]   Fax:  [phone]  Physical Therapy Treatment  Patient Details  Name: Marie Coleman MRN: [id number] Date of Birth: [birth year] Referring Provider (PT): Rolena Infante   Encounter Date: 01/08/2021   PT End of Session - 01/08/21 0929     Visit Number 3    Date for PT Re-Evaluation 04/03/21    PT Start Time 0843    PT Stop Time 0928    PT Time Calculation (min) 45 min    Activity Tolerance Patient tolerated treatment well    Behavior During Therapy [facility] for tasks assessed/performed             Past Medical History:  Diagnosis Date   Arthritis    OA AND PAIN LEFT KNEE AND PAIN IN SHOULDERS, HANDS, LOWER BACK   Osteoporosis    Seasonal allergies    Vertigo     Past Surgical History:  Procedure Laterality Date   ABDOMINAL HYSTERECTOMY  1977   HIP PINNING,CANNULATED  05/15/2011   Procedure: CANNULATED HIP PINNING;  Surgeon: Tobi Bastos;  Location: [facility];  Service: Orthopedics;  Laterality: Right;  Cannulated Right Hip Pinning   (c-arm)    LEFT ROTATOR CUFF REPAIR JAN 2012     LUMBAR SURGERY FOR HNP 1990'S     ORIF LEFT WRIST  1990'S     SURGERY FOR NECK SPURS 1980'S     TOTAL HIP REVISION  04/20/2012   Procedure: TOTAL HIP REVISION;  Surgeon: Gearlean Alf, MD;  Location: [facility];  Service: Orthopedics;  Laterality: Right;  CONVERSION OF  PREVIOUS HIP SURGERY   TOTAL KNEE ARTHROPLASTY  09/23/2011   Procedure: TOTAL KNEE ARTHROPLASTY;  Surgeon: Gearlean Alf, MD;  [facility];  Service: Orthopedics;  Laterality: Left;    There were no vitals filed for this visit.   Subjective Assessment - 01/08/21 0847     Subjective I am sore kind of all over    Currently in Pain? Yes    Pain Score 3     Pain Location Back    Pain Orientation Lower    Pain Descriptors / Indicators Sore    Aggravating Factors  bending and walking                                [facility] Adult PT Treatment/Exercise - 01/08/21 0001       High Level Balance   High Level Balance Activities Negotitating around obstacles;Negotiating over obstacles    High Level Balance Comments alternating foot cone toe touches, on airex ball toss, on airex 6" toe touches      Lumbar Exercises: Stretches   Passive Hamstring Stretch Right;Left;3 reps;20 seconds    Piriformis Stretch Right;Left;3 reps;20 seconds      Lumbar Exercises: Aerobic   Nustep level 4 x 6 minutes      Lumbar Exercises: Machines for Strengthening   Cybex Lumbar Extension 5# 2x10    Cybex Knee Extension 15# 2x10    Leg Press 20# 2x120      Lumbar Exercises: Standing   Other Standing Lumbar Exercises red tband rows and extension 2x10 a lot of cues needed      Lumbar Exercises: Supine   Other Supine Lumbar Exercises feet on ball K2C, trunk rotation, small bridge and isometric abs  PT Short Term Goals - 01/08/21 1049       PT SHORT TERM GOAL #1   Title independent with initial HEP    Status Achieved               PT Long Term Goals - 01/01/21 1251       PT LONG TERM GOAL #1   Title cook a meal without pain or fatigue    Time 12    Period Weeks    Status New      PT LONG TERM GOAL #2   Title understand posture and body mechanics    Time 12    Period Weeks    Status New      PT LONG TERM GOAL #3   Title increase lumbar ROM 25%    Time 12    Period Weeks    Status New      PT LONG TERM GOAL #4   Title decrease pain 50% for ADL's    Time 12    Period Weeks    Status New      PT LONG TERM GOAL #5   Title resume walking around the block without fear    Time 12    Period Weeks    Status New                   Plan - 01/08/21 1048     Clinical Impression Statement I continued to add strengtha nd balance, she reports feeling very fearful of the balance but really did well, is very hesitant.  LE's are  very weak    PT Next Visit Plan work on strength, balance and function    Consulted and Agree with Plan of Care Patient             Patient will benefit from skilled therapeutic intervention in order to improve the following deficits and impairments:  Decreased range of motion, Difficulty walking, Increased muscle spasms, Decreased endurance, Decreased activity tolerance, Pain, Decreased balance, Decreased mobility, Decreased strength, Postural dysfunction  Visit Diagnosis: Acute bilateral low back pain without sciatica  Acute pain of left knee  Acute pain of right knee  Difficulty in walking, not elsewhere classified  Muscle weakness (generalized)  Unsteadiness on feet     Problem List Patient Active Problem List   Diagnosis Date Noted   Avascular necrosis of bone of hip ([place]) 04/20/2012   Postop Transfusion  09/26/2011   Postop Hyponatremia 09/26/2011   Postop Acute blood loss anemia 09/25/2011   OA (osteoarthritis) of knee 09/23/2011   Hip fracture, right ([place]) 05/14/2011    Class: Acute    [name], PT 01/08/2021, 11:47 AM  [address]. [place], [place], [address] Phone: [phone]   Fax:  [phone]  Name: Marie Coleman MRN: [id number] Date of Birth: [birth year]

## 2021-01-09 ENCOUNTER — Ambulatory Visit
Admission: RE | Admit: 2021-01-09 | Discharge: 2021-01-09 | Disposition: A | Discharge status: Home / Self Care | Payer: Medicare Other | Source: Ambulatory Visit | Attending: Family Medicine | Admitting: Family Medicine

## 2021-01-09 DIAGNOSIS — N281 Cyst of kidney, acquired: Secondary | ICD-10-CM | POA: Diagnosis not present

## 2021-01-09 DIAGNOSIS — R634 Abnormal weight loss: Secondary | ICD-10-CM

## 2021-01-09 DIAGNOSIS — M47816 Spondylosis without myelopathy or radiculopathy, lumbar region: Secondary | ICD-10-CM | POA: Diagnosis not present

## 2021-01-09 DIAGNOSIS — I700 Atherosclerosis of aorta: Secondary | ICD-10-CM | POA: Diagnosis not present

## 2021-01-09 DIAGNOSIS — F419 Anxiety disorder, unspecified: Secondary | ICD-10-CM | POA: Diagnosis not present

## 2021-01-09 MED ORDER — IOPAMIDOL (ISOVUE-300) INJECTION 61%
100.0000 mL | Freq: Once | INTRAVENOUS | Status: AC | PRN
  Administered 2021-01-09: 100 mL via INTRAVENOUS

## 2021-01-10 ENCOUNTER — Ambulatory Visit: Payer: Medicare Other | Admitting: Physical Therapy

## 2021-01-24 ENCOUNTER — Encounter: Payer: Self-pay | Admitting: Physical Therapy

## 2021-01-24 ENCOUNTER — Other Ambulatory Visit: Payer: Self-pay

## 2021-01-24 ENCOUNTER — Ambulatory Visit: Payer: Medicare Other | Attending: Orthopedic Surgery | Admitting: Physical Therapy

## 2021-01-24 DIAGNOSIS — R262 Difficulty in walking, not elsewhere classified: Secondary | ICD-10-CM | POA: Diagnosis not present

## 2021-01-24 DIAGNOSIS — M25562 Pain in left knee: Secondary | ICD-10-CM | POA: Diagnosis not present

## 2021-01-24 DIAGNOSIS — M545 Low back pain, unspecified: Secondary | ICD-10-CM | POA: Diagnosis not present

## 2021-01-24 DIAGNOSIS — M25561 Pain in right knee: Secondary | ICD-10-CM | POA: Insufficient documentation

## 2021-01-24 DIAGNOSIS — R2681 Unsteadiness on feet: Secondary | ICD-10-CM | POA: Insufficient documentation

## 2021-01-24 DIAGNOSIS — M6281 Muscle weakness (generalized): Secondary | ICD-10-CM | POA: Insufficient documentation

## 2021-01-24 DIAGNOSIS — Z111 Encounter for screening for respiratory tuberculosis: Secondary | ICD-10-CM | POA: Diagnosis not present

## 2021-01-24 NOTE — Therapy (Signed)
[place]. [place], [place], [address] Phone: [phone]   Fax:  [phone]  Physical Therapy Treatment  Patient Details  Name: NISHTHA LAURAIN MRN: [id number] Date of Birth: 1941/05/10 Referring Provider (PT): Rolena Infante   Encounter Date: 01/24/2021   PT End of Session - 01/24/21 0912     Visit Number 4    Date for PT Re-Evaluation 04/03/21    PT Start Time [id number]    PT Stop Time 0940    PT Time Calculation (min) 56 min             Past Medical History:  Diagnosis Date   Arthritis    OA AND PAIN LEFT KNEE AND PAIN IN SHOULDERS, HANDS, LOWER BACK   Osteoporosis    Seasonal allergies    Vertigo     Past Surgical History:  Procedure Laterality Date   ABDOMINAL HYSTERECTOMY  1977   HIP PINNING,CANNULATED  05/15/2011   Procedure: CANNULATED HIP PINNING;  Surgeon: Tobi Bastos;  [facility];  Service: Orthopedics;  Laterality: Right;  Cannulated Right Hip Pinning   (c-arm)    LEFT ROTATOR CUFF REPAIR JAN 2012     LUMBAR SURGERY FOR HNP 1990'S     ORIF LEFT WRIST  1990'S     SURGERY FOR NECK SPURS 1980'S     TOTAL HIP REVISION  04/20/2012   Procedure: TOTAL HIP REVISION;  Surgeon: Gearlean Alf, MD;  [facility];  Service: Orthopedics;  Laterality: Right;  CONVERSION OF  PREVIOUS HIP SURGERY   TOTAL KNEE ARTHROPLASTY  09/23/2011   Procedure: TOTAL KNEE ARTHROPLASTY;  Surgeon: Gearlean Alf, MD;  [facility];  Service: Orthopedics;  Laterality: Left;    There were no vitals filed for this visit.   Subjective Assessment - 01/24/21 0847     Subjective I am having more tightness and pain in the low back, I have been doing the exercises    Currently in Pain? Yes    Pain Score 3     Pain Location Back    Pain Orientation Lower    Pain Descriptors / Indicators Aching;Discomfort    Aggravating Factors  standing, bending, walking                               [facility]  Adult PT Treatment/Exercise - 01/24/21 0001       Lumbar Exercises: Stretches   Passive Hamstring Stretch Right;Left;3 reps;20 seconds    Lower Trunk Rotation 4 reps;20 seconds    Piriformis Stretch Right;Left;3 reps;20 seconds      Lumbar Exercises: Aerobic   Nustep level 5 x 6 minutes      Lumbar Exercises: Machines for Strengthening   Cybex Lumbar Extension 5# 2x10    Cybex Knee Extension 15# 2x10    Leg Press 20# 2x120    Other Lumbar Machine Exercise 5# row and extension      Lumbar Exercises: Standing   Other Standing Lumbar Exercises 8# farmer carry      Lumbar Exercises: Supine   Other Supine Lumbar Exercises feet on ball K2C, trunk rotation, small bridge and isometric abs      Modalities   Modalities Electrical Stimulation;Moist Heat      Moist Heat Therapy   Number Minutes Moist Heat 10 Minutes    Moist Heat Location Lumbar Spine      Electrical Stimulation  Electrical Stimulation Location lumbar area    Electrical Stimulation Action IFC    Electrical Stimulation Parameters supine    Electrical Stimulation Goals Pain                      PT Short Term Goals - 01/08/21 1049       PT SHORT TERM GOAL #1   Title independent with initial HEP    Status Achieved               PT Long Term Goals - 01/24/21 0914       PT LONG TERM GOAL #1   Title cook a meal without pain or fatigue      PT LONG TERM GOAL #2   Title understand posture and body mechanics    Status On-going                   Plan - 01/24/21 0912     Clinical Impression Statement Patient had a little more c/o soreness and tightness in the lumbar area, I finished today with the modalities to see if this would help, she reports doing the HEP but also reports may be overdoing it.  He LE's are very tight    PT Next Visit Plan work on strength, balance and function, see if back pain improves             Patient will benefit from skilled therapeutic intervention  in order to improve the following deficits and impairments:  Decreased range of motion, Difficulty walking, Increased muscle spasms, Decreased endurance, Decreased activity tolerance, Pain, Decreased balance, Decreased mobility, Decreased strength, Postural dysfunction  Visit Diagnosis: Acute bilateral low back pain without sciatica  Acute pain of left knee  Acute pain of right knee  Difficulty in walking, not elsewhere classified  Muscle weakness (generalized)  Unsteadiness on feet     Problem List Patient Active Problem List   Diagnosis Date Noted   Avascular necrosis of bone of hip ([place]) 04/20/2012   Postop Transfusion  09/26/2011   Postop Hyponatremia 09/26/2011   Postop Acute blood loss anemia 09/25/2011   OA (osteoarthritis) of knee 09/23/2011   Hip fracture, right ([place]) 05/14/2011    Class: Acute    [name], PT 01/24/2021, 9:24 AM  [place]. [place], [place], [address] Phone: [phone]   Fax:  [phone]  Name: CLEONA WOLLMAN MRN: [id number] Date of Birth: May 16, 1941

## 2021-01-30 ENCOUNTER — Encounter: Payer: Self-pay | Admitting: Physical Therapy

## 2021-01-30 ENCOUNTER — Other Ambulatory Visit: Payer: Self-pay

## 2021-01-30 ENCOUNTER — Ambulatory Visit: Payer: Medicare Other | Admitting: Physical Therapy

## 2021-01-30 DIAGNOSIS — M25561 Pain in right knee: Secondary | ICD-10-CM | POA: Diagnosis not present

## 2021-01-30 DIAGNOSIS — M545 Low back pain, unspecified: Secondary | ICD-10-CM | POA: Diagnosis not present

## 2021-01-30 DIAGNOSIS — R262 Difficulty in walking, not elsewhere classified: Secondary | ICD-10-CM | POA: Diagnosis not present

## 2021-01-30 DIAGNOSIS — R2681 Unsteadiness on feet: Secondary | ICD-10-CM

## 2021-01-30 DIAGNOSIS — M25562 Pain in left knee: Secondary | ICD-10-CM

## 2021-01-30 DIAGNOSIS — M6281 Muscle weakness (generalized): Secondary | ICD-10-CM

## 2021-01-30 NOTE — Therapy (Signed)
[address]. [place], [place], [address] Phone: [phone]   Fax:  [phone]  Physical Therapy Treatment  Patient Details  Name: Marie Coleman MRN: [id number] Date of Birth: 04/24/1941 Referring Provider (PT): Rolena Infante   Encounter Date: 01/30/2021   PT End of Session - 01/30/21 0926     Visit Number 5    Date for PT Re-Evaluation 04/03/21    PT Start Time 0845    PT Stop Time 0928    PT Time Calculation (min) 43 min    Activity Tolerance Patient tolerated treatment well    Behavior During Therapy [facility] for tasks assessed/performed             Past Medical History:  Diagnosis Date   Arthritis    OA AND PAIN LEFT KNEE AND PAIN IN SHOULDERS, HANDS, LOWER BACK   Osteoporosis    Seasonal allergies    Vertigo     Past Surgical History:  Procedure Laterality Date   ABDOMINAL HYSTERECTOMY  1977   HIP PINNING,CANNULATED  05/15/2011   Procedure: CANNULATED HIP PINNING;  Surgeon: Tobi Bastos;  [facility];  Service: Orthopedics;  Laterality: Right;  Cannulated Right Hip Pinning   (c-arm)    LEFT ROTATOR CUFF REPAIR JAN 2012     LUMBAR SURGERY FOR HNP 1990'S     ORIF LEFT WRIST  1990'S     SURGERY FOR NECK SPURS 1980'S     TOTAL HIP REVISION  04/20/2012   Procedure: TOTAL HIP REVISION;  Surgeon: Gearlean Alf, MD;  [facility];  Service: Orthopedics;  Laterality: Right;  CONVERSION OF  PREVIOUS HIP SURGERY   TOTAL KNEE ARTHROPLASTY  09/23/2011   Procedure: TOTAL KNEE ARTHROPLASTY;  Surgeon: Gearlean Alf, MD;  [facility];  Service: Orthopedics;  Laterality: Left;    There were no vitals filed for this visit.   Subjective Assessment - 01/30/21 0849     Subjective Just stiff and tight and moving slowly today    Currently in Pain? No/denies                               [facility] Adult PT Treatment/Exercise - 01/30/21 0001       Ambulation/Gait   Gait Comments 1 lap  around the back building trying to maintain a good pace, some shuffling      High Level Balance   High Level Balance Activities Negotitating around obstacles;Negotiating over obstacles    High Level Balance Comments alternating foot cone toe touches, on airex ball toss, on airex 6" toe touches, airex balance beam tandem and then side stepping      Lumbar Exercises: Aerobic   Recumbent Bike level 3.5 x 6 minutes    Nustep level 5 x 6 minutes      Lumbar Exercises: Machines for Strengthening   Cybex Lumbar Extension 5# 2x10    Cybex Knee Extension 15# 2x10    Other Lumbar Machine Exercise 5# row and extension      Lumbar Exercises: Supine   Other Supine Lumbar Exercises feet on ball K2C, trunk rotation, small bridge and isometric abs                      PT Short Term Goals - 01/08/21 1049       PT SHORT TERM GOAL #1   Title independent with initial HEP  Status Achieved               PT Long Term Goals - 01/30/21 0929       PT LONG TERM GOAL #1   Title cook a meal without pain or fatigue    Status On-going      PT LONG TERM GOAL #2   Title understand posture and body mechanics    Status On-going      PT LONG TERM GOAL #3   Title increase lumbar ROM 25%    Status Partially Met      PT LONG TERM GOAL #4   Title decrease pain 50% for ADL's    Status Partially Met                   Plan - 01/30/21 0927     Clinical Impression Statement Patient did great with the balance, we did the long walk around the back building with some shuffle steps and cues to keep pace.  Right knee had pain with the bike and with the leg extnesion    PT Next Visit Plan continue to work on [employer], music and Agree with Plan of Care Patient             Patient will benefit from skilled therapeutic intervention in order to improve the following deficits and impairments:  Decreased range of motion, Difficulty walking, Increased muscle spasms,  Decreased endurance, Decreased activity tolerance, Pain, Decreased balance, Decreased mobility, Decreased strength, Postural dysfunction  Visit Diagnosis: Acute bilateral low back pain without sciatica  Acute pain of left knee  Acute pain of right knee  Difficulty in walking, not elsewhere classified  Muscle weakness (generalized)  Unsteadiness on feet     Problem List Patient Active Problem List   Diagnosis Date Noted   Avascular necrosis of bone of hip ([place]) 04/20/2012   Postop Transfusion  09/26/2011   Postop Hyponatremia 09/26/2011   Postop Acute blood loss anemia 09/25/2011   OA (osteoarthritis) of knee 09/23/2011   Hip fracture, right ([place]) 05/14/2011    Class: Acute    [name], PT 01/30/2021, 9:30 AM  [place]. [place], [place], [address] Phone: [phone]   Fax:  [phone]  Name: Marie Coleman MRN: [id number] Date of Birth: 1941/04/04

## 2021-02-01 ENCOUNTER — Other Ambulatory Visit: Payer: Self-pay

## 2021-02-01 ENCOUNTER — Encounter: Payer: Self-pay | Admitting: Physical Therapy

## 2021-02-01 ENCOUNTER — Ambulatory Visit: Payer: Medicare Other | Admitting: Physical Therapy

## 2021-02-01 DIAGNOSIS — M545 Low back pain, unspecified: Secondary | ICD-10-CM | POA: Diagnosis not present

## 2021-02-01 DIAGNOSIS — R262 Difficulty in walking, not elsewhere classified: Secondary | ICD-10-CM | POA: Diagnosis not present

## 2021-02-01 DIAGNOSIS — R2681 Unsteadiness on feet: Secondary | ICD-10-CM | POA: Diagnosis not present

## 2021-02-01 DIAGNOSIS — M25562 Pain in left knee: Secondary | ICD-10-CM | POA: Diagnosis not present

## 2021-02-01 DIAGNOSIS — M6281 Muscle weakness (generalized): Secondary | ICD-10-CM

## 2021-02-01 DIAGNOSIS — M25561 Pain in right knee: Secondary | ICD-10-CM | POA: Diagnosis not present

## 2021-02-01 NOTE — Therapy (Signed)
[place]. [place], [place], [address] Phone: [phone]   Fax:  [phone]  Physical Therapy Treatment  Patient Details  Name: Marie Coleman MRN: [id number] Date of Birth: 27-Nov-1940 Referring Provider (PT): Rolena Infante   Encounter Date: 02/01/2021   PT End of Session - 02/01/21 0927     Visit Number 6    Date for PT Re-Evaluation 04/03/21    PT Start Time 0848    PT Stop Time 0926    PT Time Calculation (min) 38 min    Activity Tolerance Patient tolerated treatment well    Behavior During Therapy [facility] for tasks assessed/performed             Past Medical History:  Diagnosis Date   Arthritis    OA AND PAIN LEFT KNEE AND PAIN IN SHOULDERS, HANDS, LOWER BACK   Osteoporosis    Seasonal allergies    Vertigo     Past Surgical History:  Procedure Laterality Date   ABDOMINAL HYSTERECTOMY  1977   HIP PINNING,CANNULATED  05/15/2011   Procedure: CANNULATED HIP PINNING;  Surgeon: Tobi Bastos;  [facility];  Service: Orthopedics;  Laterality: Right;  Cannulated Right Hip Pinning   (c-arm)    LEFT ROTATOR CUFF REPAIR JAN 2012     LUMBAR SURGERY FOR HNP 1990'S     ORIF LEFT WRIST  1990'S     SURGERY FOR NECK SPURS 1980'S     TOTAL HIP REVISION  04/20/2012   Procedure: TOTAL HIP REVISION;  Surgeon: Gearlean Alf, MD;  [facility];  Service: Orthopedics;  Laterality: Right;  CONVERSION OF  PREVIOUS HIP SURGERY   TOTAL KNEE ARTHROPLASTY  09/23/2011   Procedure: TOTAL KNEE ARTHROPLASTY;  Surgeon: Gearlean Alf, MD;  [facility];  Service: Orthopedics;  Laterality: Left;    There were no vitals filed for this visit.   Subjective Assessment - 02/01/21 0850     Subjective Feeling good.    Pertinent History bilateral RC repairs, left TKA, right hip ORIF    Patient Stated Goals feel stronger, less pain, be able to do more    Currently in Pain? Yes    Pain Score 3     Pain Location Knee    Pain  Orientation Right    Pain Descriptors / Indicators Sore    Pain Type Chronic pain                               [facility] Adult PT Treatment/Exercise - 02/01/21 0001       Neuro Re-ed    Neuro Re-ed Details  R/L step up/back on foam 10x without UE support      Lumbar Exercises: Stretches   Gastroc Stretch Right;Left;1 rep;30 seconds    Gastroc Stretch Limitations runner's stretch at stairs   cues for form   Other Lumbar Stretch Exercise staggered R/L hip flexor stretch 30" each   cueing for positioning     Lumbar Exercises: Aerobic   Nustep level 5 x 6 minutes (UEs/LEs)      Lumbar Exercises: Standing   Other Standing Lumbar Exercises R/L hip abduction and extension with yellow loop around ankles 10x each      Lumbar Exercises: Seated   Other Seated Lumbar Exercises STS with yellow medball at chest 10x   cues for set up and B hip ABD     Shoulder  Exercises: Standing   Row Strengthening;Both;10 reps;Theraband    Theraband Level (Shoulder Row) Level 2 (Red);Level 3 (Green)   2x10     Shoulder Exercises: Stretch   Other Shoulder Stretches R/L pec stretch in doorway 30" each                    PT Education - 02/01/21 0927     Education Details update to HEP- advised to discontinue if it exaccerbates pain    Person(s) Educated Patient    Methods Explanation;Demonstration;Tactile cues;Verbal cues;Handout    Comprehension Verbalized understanding;Returned demonstration              PT Short Term Goals - 01/08/21 1049       PT SHORT TERM GOAL #1   Title independent with initial HEP    Status Achieved               PT Long Term Goals - 01/30/21 0929       PT LONG TERM GOAL #1   Title cook a meal without pain or fatigue    Status On-going      PT LONG TERM GOAL #2   Title understand posture and body mechanics    Status On-going      PT LONG TERM GOAL #3   Title increase lumbar ROM 25%    Status Partially Met      PT LONG  TERM GOAL #4   Title decrease pain 50% for ADL's    Status Partially Met                   Plan - 02/01/21 0928     Clinical Impression Statement Patient without new complaints this AM. Performed STS transfers with additional weight with cues for set up and B hip abduction. Dynamic balance challenges were provided on compliant surfaces. Patient intermittent catching toes on foam d/t poor foot clearance which improved with cues. Gastroc and hip flexor stretching was performed to improve foot clearance and posture, d/t tendency for patient to hold crouched posture in standing. Postural strengthening was performed with consistent cueing for upright trunk and scapular retraction. Entirety of session was well-tolerated. Patient without complaints at end of session and is progressing well towards goals.    PT Treatment/Interventions ADLs/Self Care Home Management;Electrical Stimulation;Cryotherapy;Iontophoresis 49m/ml Dexamethasone;Moist Heat;Therapeutic exercise;Therapeutic activities;Neuromuscular re-education;Manual techniques;Patient/family education;Functional mobility training;Stair training;Balance training    PT Next Visit Plan continue to work on sGarment/textile technologistand Agree with Plan of Care Patient             Patient will benefit from skilled therapeutic intervention in order to improve the following deficits and impairments:  Decreased range of motion, Difficulty walking, Increased muscle spasms, Decreased endurance, Decreased activity tolerance, Pain, Decreased balance, Decreased mobility, Decreased strength, Postural dysfunction  Visit Diagnosis: Acute bilateral low back pain without sciatica  Acute pain of left knee  Acute pain of right knee  Difficulty in walking, not elsewhere classified  Muscle weakness (generalized)  Unsteadiness on feet     Problem List Patient Active Problem List   Diagnosis Date Noted   [place] of  [place] 04/20[place]2012   Postop Transfusion  09/26/2011   Postop Hyponatremia 09/26/2011   Postop Acute blood loss anemia 09/25/2011   OA (osteoarthritis) of knee 09/23/2011   [place] right [place] 05/14/2011    Class: Acute     YJanene Harvey PT, DPT 02/01/21 9:30 AM  [place]. [place], [place], [address] Phone: [phone]   Fax:  [phone]  Name: Marie Coleman MRN: [id number] Date of Birth: 1941/05/06

## 2021-02-06 ENCOUNTER — Other Ambulatory Visit: Payer: Self-pay

## 2021-02-06 ENCOUNTER — Ambulatory Visit: Payer: Medicare Other | Admitting: Physical Therapy

## 2021-02-06 ENCOUNTER — Encounter: Payer: Self-pay | Admitting: Physical Therapy

## 2021-02-06 DIAGNOSIS — R2681 Unsteadiness on feet: Secondary | ICD-10-CM

## 2021-02-06 DIAGNOSIS — M25561 Pain in right knee: Secondary | ICD-10-CM

## 2021-02-06 DIAGNOSIS — R262 Difficulty in walking, not elsewhere classified: Secondary | ICD-10-CM

## 2021-02-06 DIAGNOSIS — M6281 Muscle weakness (generalized): Secondary | ICD-10-CM

## 2021-02-06 DIAGNOSIS — M545 Low back pain, unspecified: Secondary | ICD-10-CM | POA: Diagnosis not present

## 2021-02-06 DIAGNOSIS — M25562 Pain in left knee: Secondary | ICD-10-CM | POA: Diagnosis not present

## 2021-02-06 NOTE — Therapy (Signed)
[place] [place], [place], [address] Phone: [phone]   Fax:  [phone]  Physical Therapy Treatment  Patient Details  Name: Marie Coleman MRN: [id number] Date of Birth: [birth year] Referring Provider (PT): Rolena Infante   Encounter Date: 02/06/2021   PT End of Session - 02/06/21 0923     Visit Number 7    Date for PT Re-Evaluation 04/03/21    PT Start Time 0845    PT Stop Time 0928    PT Time Calculation (min) 43 min    Activity Tolerance Patient tolerated treatment well    Behavior During Therapy [facility] for tasks assessed/performed             Past Medical History:  Diagnosis Date   Arthritis    OA AND PAIN LEFT KNEE AND PAIN IN SHOULDERS, HANDS, LOWER BACK   Osteoporosis    Seasonal allergies    Vertigo     Past Surgical History:  Procedure Laterality Date   ABDOMINAL HYSTERECTOMY  1977   HIP PINNING,CANNULATED  05/15/2011   Procedure: CANNULATED HIP PINNING;  Surgeon: Tobi Bastos;  Location: [facility];  Service: Orthopedics;  Laterality: Right;  Cannulated Right Hip Pinning   (c-arm)    LEFT ROTATOR CUFF REPAIR JAN 2012     LUMBAR SURGERY FOR HNP 1990'S     ORIF LEFT WRIST  1990'S     SURGERY FOR NECK SPURS 1980'S     TOTAL HIP REVISION  04/20/2012   Procedure: TOTAL HIP REVISION;  Surgeon: Gearlean Alf, MD;  Location: [facility];  Service: Orthopedics;  Laterality: Right;  CONVERSION OF  PREVIOUS HIP SURGERY   TOTAL KNEE ARTHROPLASTY  09/23/2011   Procedure: TOTAL KNEE ARTHROPLASTY;  Surgeon: Gearlean Alf, MD;  Location: [facility];  Service: Orthopedics;  Laterality: Left;    There were no vitals filed for this visit.   Subjective Assessment - 02/06/21 0855     Subjective I am shuffling a little today    Currently in Pain? No/denies                               [facility] Adult PT Treatment/Exercise - 02/06/21 0001       Ambulation/Gait   Gait Comments 1 lap around the back  building trying to maintain a good pace, some shuffling      High Level Balance   High Level Balance Comments on airex reaching, cone toe taps, ball toss and then tandem stance ball toss, walking ball toss all directions, solid surface tandem stance ball toss, airex balance beam tandem and side stepping      Lumbar Exercises: Machines for Strengthening   Cybex Lumbar Extension 5# 2x10    Cybex Knee Extension 20# 3x10    Leg Press 20# 2x120    Other Lumbar Machine Exercise 10# row and 15# lats 2x10      Lumbar Exercises: Seated   Other Seated Lumbar Exercises STS with yellow medball at chest 10x                      PT Short Term Goals - 01/08/21 1049       PT SHORT TERM GOAL #1   Title independent with initial HEP    Status Achieved               PT Long Term Goals - 02/06/21  0925       PT LONG TERM GOAL #1   Title cook a meal without pain or fatigue    Status On-going      PT LONG TERM GOAL #2   Title understand posture and body mechanics    Status Partially Met                   Plan - 02/06/21 0923     Clinical Impression Statement I am adding exerciess for strength and function and balance, she is doing well, difficulty with narrow BOS.  Having some medial right knee pain with some of the sit to stand activities, started talking with her about her future HEP    PT Next Visit Plan continue to work on Garment/textile technologist and Agree with Plan of Care Patient             Patient will benefit from skilled therapeutic intervention in order to improve the following deficits and impairments:  Decreased range of motion, Difficulty walking, Increased muscle spasms, Decreased endurance, Decreased activity tolerance, Pain, Decreased balance, Decreased mobility, Decreased strength, Postural dysfunction  Visit Diagnosis: Acute bilateral low back pain without sciatica  Acute pain of left knee  Acute pain of right knee  Difficulty  in walking, not elsewhere classified  Muscle weakness (generalized)  Unsteadiness on feet     Problem List Patient Active Problem List   Diagnosis Date Noted   Avascular necrosis of bone of hip ([place]) 04/20/2012   Postop Transfusion  09/26/2011   Postop Hyponatremia 09/26/2011   Postop Acute blood loss anemia 09/25/2011   OA (osteoarthritis) of knee 09/23/2011   Hip fracture, right ([place]) 05/14/2011    Class: Acute    [name], PT 02/06/2021, 9:26 AM  [address] [place] [place], [place], [address] Phone: [phone]   Fax:  [phone]  Name: Marie Coleman MRN: [id number] Date of Birth: 13-Nov-1940

## 2021-02-07 DIAGNOSIS — M5459 Other low back pain: Secondary | ICD-10-CM | POA: Diagnosis not present

## 2021-02-08 ENCOUNTER — Ambulatory Visit: Payer: Medicare Other | Admitting: Physical Therapy

## 2021-02-12 ENCOUNTER — Encounter: Payer: Self-pay | Admitting: Physical Therapy

## 2021-02-12 ENCOUNTER — Ambulatory Visit: Payer: Medicare Other | Admitting: Physical Therapy

## 2021-02-12 ENCOUNTER — Other Ambulatory Visit: Payer: Self-pay

## 2021-02-12 DIAGNOSIS — M25561 Pain in right knee: Secondary | ICD-10-CM | POA: Diagnosis not present

## 2021-02-12 DIAGNOSIS — M545 Low back pain, unspecified: Secondary | ICD-10-CM

## 2021-02-12 DIAGNOSIS — R636 Underweight: Secondary | ICD-10-CM | POA: Diagnosis not present

## 2021-02-12 DIAGNOSIS — R2681 Unsteadiness on feet: Secondary | ICD-10-CM | POA: Diagnosis not present

## 2021-02-12 DIAGNOSIS — R262 Difficulty in walking, not elsewhere classified: Secondary | ICD-10-CM | POA: Diagnosis not present

## 2021-02-12 DIAGNOSIS — M810 Age-related osteoporosis without current pathological fracture: Secondary | ICD-10-CM | POA: Diagnosis not present

## 2021-02-12 DIAGNOSIS — M6281 Muscle weakness (generalized): Secondary | ICD-10-CM

## 2021-02-12 DIAGNOSIS — M25562 Pain in left knee: Secondary | ICD-10-CM | POA: Diagnosis not present

## 2021-02-12 DIAGNOSIS — M1990 Unspecified osteoarthritis, unspecified site: Secondary | ICD-10-CM | POA: Diagnosis not present

## 2021-02-12 NOTE — Therapy (Signed)
[address]. [place], [place], [address] Phone: [phone]   Fax:  [phone]  Physical Therapy Treatment  Patient Details  Name: Marie Coleman MRN: [id number] Date of Birth: Nov 06, 1940 Referring Provider (PT): Rolena Infante   Encounter Date: 02/12/2021   PT End of Session - 02/12/21 0927     Visit Number 8    Date for PT Re-Evaluation 04/03/21    PT Start Time 6073    PT Stop Time 0928    PT Time Calculation (min) 44 min    Activity Tolerance Patient tolerated treatment well    Behavior During Therapy [facility] for tasks assessed/performed             Past Medical History:  Diagnosis Date   Arthritis    OA AND PAIN LEFT KNEE AND PAIN IN SHOULDERS, HANDS, LOWER BACK   Osteoporosis    Seasonal allergies    Vertigo     Past Surgical History:  Procedure Laterality Date   ABDOMINAL HYSTERECTOMY  1977   HIP PINNING,CANNULATED  05/15/2011   Procedure: CANNULATED HIP PINNING;  Surgeon: Tobi Bastos;  [facility]: [facility];  Service: Orthopedics;  Laterality: Right;  Cannulated Right Hip Pinning   (c-arm)    LEFT ROTATOR CUFF REPAIR JAN 2012     LUMBAR SURGERY FOR HNP 1990'S     ORIF LEFT WRIST  1990'S     SURGERY FOR NECK SPURS 1980'S     TOTAL HIP REVISION  04/20/2012   Procedure: TOTAL HIP REVISION;  Surgeon: Gearlean Alf, MD;  Location: [facility];  Service: Orthopedics;  Laterality: Right;  CONVERSION OF  PREVIOUS HIP SURGERY   TOTAL KNEE ARTHROPLASTY  09/23/2011   Procedure: TOTAL KNEE ARTHROPLASTY;  Surgeon: Gearlean Alf, MD;  [facility]: [facility];  Service: Orthopedics;  Laterality: Left;    There were no vitals filed for this visit.   Subjective Assessment - 02/12/21 0850     Subjective My right knee is hurting, I cancelled a day last week due to the pain    Currently in Pain? Yes    Pain Score 0-No pain    Pain Location Knee    Pain Orientation Right;Medial    Pain Descriptors / Indicators Aching;Sore     Aggravating Factors  get up and walk, weight bearing pain up to 7/10                               [facility] Adult PT Treatment/Exercise - 02/12/21 0001       Lumbar Exercises: Machines for Strengthening   Other Lumbar Machine Exercise 5# shoulder extension on the pulley    Other Lumbar Machine Exercise 10# row and 15# lats 2x10      Lumbar Exercises: Seated   Other Seated Lumbar Exercises red tband ankle PF/DF      Lumbar Exercises: Supine   Bridge with [employer] 20 reps;1 second    Bridge with clamshell 20 reps;1 second    Other Supine Lumbar Exercises SAQ 3# 2x10 bilaterallyt    Other Supine Lumbar Exercises feet on ball K2C, trunk rotation, small bridge and isometric abs      Lumbar Exercises: Sidelying   Clam Both;20 reps;1 second    Clam Limitations 3#                      PT Short Term Goals - 01/08/21  [place] #1   Title independent with initial HEP    Status Achieved               PT Long Term Goals - 02/06/21 0925       PT LONG TERM GOAL #1   Title cook a meal without pain or fatigue    Status On-going      PT LONG TERM GOAL #2   Title understand posture and body mechanics    Status Partially Met                   Plan - 02/12/21 0927     Clinical Impression Statement due to knee pain we did less weight bearing and less knee activities, she is having medial right knee pain a 7/10 with weight bearing, changed up exercises to address this    PT Next Visit Plan continue to work on Garment/textile technologist and Agree with Plan of Care Patient             Patient will benefit from skilled therapeutic intervention in order to improve the following deficits and impairments:  Decreased range of motion, Difficulty walking, Increased muscle spasms, Decreased endurance, Decreased activity tolerance, Pain, Decreased balance, Decreased mobility, Decreased strength, Postural  dysfunction  Visit Diagnosis: Acute bilateral low back pain without sciatica  Acute pain of left knee  Acute pain of right knee  Difficulty in walking, not elsewhere classified  Muscle weakness (generalized)  Unsteadiness on feet     Problem List Patient Active Problem List   Diagnosis Date Noted   Avascular necrosis of bone of hip ([place]) 04/20/2012   Postop Transfusion  09/26/2011   Postop Hyponatremia 09/26/2011   Postop Acute blood loss anemia 09/25/2011   OA (osteoarthritis) of knee 09/23/2011   Hip fracture, right ([place]) 05/14/2011    Class: Acute    [name], PT 02/12/2021, 9:28 AM  [place]. [place], [place], [address] Phone: [phone]   Fax:  [phone]  Name: Marie Coleman MRN: [id number] Date of Birth: 02-03-1941

## 2021-02-15 ENCOUNTER — Encounter: Payer: Self-pay | Admitting: *Deleted

## 2021-02-16 ENCOUNTER — Encounter: Payer: Self-pay | Admitting: Physical Therapy

## 2021-02-16 ENCOUNTER — Other Ambulatory Visit: Payer: Self-pay

## 2021-02-16 ENCOUNTER — Ambulatory Visit: Payer: Medicare Other | Attending: Orthopedic Surgery | Admitting: Physical Therapy

## 2021-02-16 DIAGNOSIS — R2681 Unsteadiness on feet: Secondary | ICD-10-CM | POA: Diagnosis not present

## 2021-02-16 DIAGNOSIS — R262 Difficulty in walking, not elsewhere classified: Secondary | ICD-10-CM | POA: Diagnosis not present

## 2021-02-16 DIAGNOSIS — M6281 Muscle weakness (generalized): Secondary | ICD-10-CM | POA: Diagnosis not present

## 2021-02-16 DIAGNOSIS — M545 Low back pain, unspecified: Secondary | ICD-10-CM | POA: Insufficient documentation

## 2021-02-16 DIAGNOSIS — M25562 Pain in left knee: Secondary | ICD-10-CM | POA: Diagnosis not present

## 2021-02-16 DIAGNOSIS — M25561 Pain in right knee: Secondary | ICD-10-CM | POA: Diagnosis not present

## 2021-02-16 NOTE — Therapy (Signed)
[place]. [place], [place], [address] Phone: [phone]   Fax:  [phone]  Physical Therapy Treatment  Patient Details  Name: Marie Coleman MRN: [id number] Date of Birth: 25-May-1941 Referring Provider (PT): Rolena Infante   Encounter Date: 02/16/2021   PT End of Session - 02/16/21 0930     Visit Number 9    Date for PT Re-Evaluation 04/03/21    PT Start Time 0845    PT Stop Time 0930    PT Time Calculation (min) 45 min    Activity Tolerance Patient tolerated treatment well    Behavior During Therapy [facility] for tasks assessed/performed             Past Medical History:  Diagnosis Date   Arthritis    OA AND PAIN LEFT KNEE AND PAIN IN SHOULDERS, HANDS, LOWER BACK   BPV (benign positional vertigo)    Osteoporosis    Seasonal allergies    Tremor    Vertigo     Past Surgical History:  Procedure Laterality Date   ABDOMINAL HYSTERECTOMY  06/18/1975   BACK SURGERY     CATARACT EXTRACTION, BILATERAL Bilateral 2019   HIP PINNING,CANNULATED  05/15/2011   Procedure: CANNULATED HIP PINNING;  Surgeon: Tobi Bastos;  [facility]: [facility];  Service: Orthopedics;  Laterality: Right;  Cannulated Right Hip Pinning   (c-arm)    LEFT ROTATOR CUFF REPAIR JAN 2012     LUMBAR SURGERY FOR HNP 1990'S     ORIF LEFT WRIST  1990'S     ROTATOR CUFF REPAIR Right 08/2012   SURGERY FOR NECK SPURS 1980'S     TOTAL HIP REVISION  04/20/2012   Procedure: TOTAL HIP REVISION;  Surgeon: Gearlean Alf, MD;  [facility]: [facility];  Service: Orthopedics;  Laterality: Right;  CONVERSION OF  PREVIOUS HIP SURGERY   TOTAL KNEE ARTHROPLASTY  09/23/2011   Procedure: TOTAL KNEE ARTHROPLASTY;  Surgeon: Gearlean Alf, MD;  Location[facility];  Service: Orthopedics;  Laterality: Left;    There were no vitals filed for this visit.   Subjective Assessment - 02/16/21 0908     Subjective Right knee still hurting but not as bad    Aggravating Factors   different things cause the knee to hurt up to 7/10                               [facility] Adult PT Treatment/Exercise - 02/16/21 0001       High Level Balance   High Level Balance Comments on airex ball toss, head turns, eyes closed      Lumbar Exercises: Machines for Strengthening   Other Lumbar Machine Exercise 5# shoulder extension on the pulley    Other Lumbar Machine Exercise 10# row and 15# lats 2x10      Lumbar Exercises: Seated   Other Seated Lumbar Exercises red tband ankle PF/DF, HS curls      Lumbar Exercises: Supine   Bridge with [employer] 20 reps;1 second    Bridge with clamshell 20 reps;1 second    Other Supine Lumbar Exercises SAQ 3# 2x10 bilaterallyt    Other Supine Lumbar Exercises feet on ball K2C, trunk rotation, small bridge and isometric abs      Lumbar Exercises: Sidelying   Clam Both;20 reps;1 second    Clam Limitations 3#  PT Short Term Goals - 01/08/21 1049       PT SHORT TERM GOAL #1   Title independent with initial HEP    Status Achieved               PT Long Term Goals - 02/16/21 0932       PT LONG TERM GOAL #1   Title cook a meal without pain or fatigue    Status Partially Met                   Plan - 02/16/21 0930     Clinical Impression Statement Patient continues to have some knee pain, I went back to some exercises and tried to avoid anything that was causing pain, we also went back to the balance activities.  She reports long standing cartilage and arthritis issues with the knee.  She does report trying to do some walking in the yard for exercise and balance    PT Next Visit Plan continue to work on strengtrh and balance adjust according to the knee    Consulted and Agree with Plan of Care Patient             Patient will benefit from skilled therapeutic intervention in order to improve the following deficits and impairments:  Decreased range of motion,  Difficulty walking, Increased muscle spasms, Decreased endurance, Decreased activity tolerance, Pain, Decreased balance, Decreased mobility, Decreased strength, Postural dysfunction  Visit Diagnosis: Acute bilateral low back pain without sciatica  Acute pain of left knee  Acute pain of right knee  Difficulty in walking, not elsewhere classified  Muscle weakness (generalized)  Unsteadiness on feet     Problem List Patient Active Problem List   Diagnosis Date Noted   Avascular necrosis of bone of hip ([place]) 04/20/2012   Postop Transfusion  09/26/2011   Postop Hyponatremia 09/26/2011   Postop Acute blood loss anemia 09/25/2011   OA (osteoarthritis) of knee 09/23/2011   Hip fracture, right ([place]) 05/14/2011    Class: Acute    [name], PT 02/16/2021, 9:33 AM  [place] [address] [place]. [place], [place], [address] Phone: [phone]   Fax:  [phone]  Name: Marie Coleman MRN: [id number] Date of Birth: 09-22-1940

## 2021-02-20 ENCOUNTER — Encounter: Payer: Self-pay | Admitting: Neurology

## 2021-02-20 ENCOUNTER — Ambulatory Visit (INDEPENDENT_AMBULATORY_CARE_PROVIDER_SITE_OTHER): Payer: Medicare Other | Admitting: Neurology

## 2021-02-20 VITALS — BP 111/71 | HR 80 | Ht 66.0 in | Wt 109.2 lb

## 2021-02-20 DIAGNOSIS — G250 Essential tremor: Secondary | ICD-10-CM

## 2021-02-20 NOTE — Progress Notes (Deleted)
Subjective:    Patient ID: Marie Coleman is a 80 y.o. female.  HPI {Common ambulatory SmartLinks:[id number]}  Review of Systems  Neurological:        Tremors.   Objective:  Neurological Exam  Physical Exam  Assessment:   ***  Plan:   ***

## 2021-02-20 NOTE — Patient Instructions (Addendum)
You have a rather mild tremor currently of both hands.  It is likely hereditary. I do not see any signs or symptoms of parkinson's like disease or what we call parkinsonism.   For your tremor, I would not recommend any new medication for fear of side effects.  But you can certainly try the low-dose atenolol that Dr. Radene Ou prescribed last year.  Since you have never tried it, it may be worth trying, she prescribed a low-dose.   Please remember, that any kind of tremor may be exacerbated by stress, anxiety, anger, nervousness, excitement, thyroid disease, dehydration, sleep deprivation, by caffeine, and low blood sugar values or blood sugar fluctuations.  I would be happy to recheck your tremor once you are on medication, please make a follow-up appointment in 4 months.

## 2021-02-20 NOTE — Progress Notes (Signed)
Subjective:    Patient ID: Marie Coleman is a 80 y.o. female.  HPI    Star Age, MD, PhD [facility] [address] [place], [place] [address]  Dear Dr. Radene Ou,   I saw your patient, Marie Coleman, upon your kind request in my neurologic clinic today for initial consultation of her tremors.  Patient is accompanied by her son today.  As you know, Ms. Marie Coleman is an 80 year old right-handed woman with an underlying medical history of allergic rhinitis, vertigo, balance problem, and osteoarthritis, with status post multiple surgeries including bilateral rotator cuff repairs, neck surgery, lower back surgery, left knee replacement, and right hip replacement, status post fall with injuries in September 2021, who reports an approximately 1 year history of bilateral hand tremors.  Tremors have been more or less stable but seem to be worse when she is holding something or feeding herself or when she gets anxious or stressed out.  She does report recent stress in the past several months.  She and her husband are planning to move into [facility] independent living.  He has been diagnosed with dementia.  I reviewed your office note from 04/19/2020.  She was prescribed atenolol at the time, 25 mg strength half the pill once daily.  She reports that she never filled the prescription.  She does report issues with her balance and with intermittent vertigo and feeling dizzy.  Her son suspects that that is why she never filled the atenolol prescription but she does not remember exactly why she never tried it.  She does not drink caffeine on a day-to-day basis, typically drinks decaf coffee, 1 cup in the morning and occasional tea in the middle of the day.  She does not hydrate very well by self admission and reports that she drinks maybe 2 large cups of water per day, estimated to be a total of 32 ounces.  She does not drink alcohol regularly.  She lives with her husband, she has 2  grown children, she has no family history of Parkinson's disease.  Her father had a hand tremor.  She has 1 brother, he does not have a tremor.  She sees orthopedics on a regular basis and is scheduled to see Dr. Nelva Bush.  She had a remote brain MRI on 02/02/2008 with and without contrast for evaluation of vertigo and I reviewed the results:  IMPRESSION:  1.  No evidence of acute ischemia.  2.  Focus of hyperintensity in the right paramedian pons.  This is  nonspecific in terms of etiology.  Differential considerations  would be changes due to small vessel disease due to hypertension  and/or diabetes.  Less likely possibility of a demyelinating  process or vasculitis.  3.  No abnormal enhancement, mass effect or vascular loops seen in  the cerebellopontine angle regions.  4.  Mild mucosal thickening in the ethmoids and the frontal  sinuses.    She had a head CT and maxillofacial CT without contrast on 06/25/2015 with indication of left temporal headache and I reviewed the results: IMPRESSION: CT HEAD   1. Negative CT FACE   1. Negative  Her Past Medical History Is Significant For: Past Medical History:  Diagnosis Date   Arthritis    OA AND PAIN LEFT KNEE AND PAIN IN SHOULDERS, HANDS, LOWER BACK   BPV (benign positional vertigo)    Osteoporosis    Seasonal allergies    Tremor    Vertigo  Her Past Surgical History Is Significant For: Past Surgical History:  Procedure Laterality Date   ABDOMINAL HYSTERECTOMY  06/18/1975   BACK SURGERY     CATARACT EXTRACTION, BILATERAL Bilateral 2019   HIP PINNING,CANNULATED  05/15/2011   Procedure: CANNULATED HIP PINNING;  Surgeon: Tobi Bastos;  Location: [facility];  Service: Orthopedics;  Laterality: Right;  Cannulated Right Hip Pinning   (c-arm)    LEFT ROTATOR CUFF REPAIR JAN 2012     LUMBAR SURGERY FOR HNP 1990'S     ORIF LEFT WRIST  1990'S     ROTATOR CUFF REPAIR Right 08/2012   SURGERY FOR NECK SPURS 1980'S     TOTAL HIP REVISION   04/20/2012   Procedure: TOTAL HIP REVISION;  Surgeon: Gearlean Alf, MD;  Location: [facility];  Service: Orthopedics;  Laterality: Right;  CONVERSION OF  PREVIOUS HIP SURGERY   TOTAL KNEE ARTHROPLASTY  09/23/2011   Procedure: TOTAL KNEE ARTHROPLASTY;  Surgeon: Gearlean Alf, MD;  Location: [facility];  Service: Orthopedics;  Laterality: Left;    Her Family History Is Significant For: Family History  Problem Relation Age of Onset   Dementia Mother    Heart attack Father    Tremor Neg Hx     Her Social History Is Significant For: Social History   Socioeconomic History   Marital status: Married    Spouse name: Not on file   Number of children: 1   Years of education: Not on file   Highest education level: Not on file  Occupational History   Not on file  Tobacco Use   Smoking status: Never   Smokeless tobacco: Never  Vaping Use   Vaping Use: Never used  Substance and Sexual Activity   Alcohol use: Yes    Comment: OCCAS mixed drink   Drug use: No   Sexual activity: Not on file  Other Topics Concern   Not on file  Social History Narrative   Caffeine [employer]   Social Determinants of Health   Financial Resource Strain: Not on file  Food Insecurity: Not on file  Transportation Needs: Not on file  Physical Activity: Not on file  Stress: Not on file  Social Connections: Not on file    Her Allergies Are:  Allergies  Allergen Reactions   Sulfa Antibiotics Hives  :   Her Current Medications Are:  Outpatient Encounter Medications as of 02/20/2021  Medication Sig   acetaminophen (TYLENOL) 500 MG tablet Take 500 mg by mouth daily as needed for moderate pain or headache. Reported on 06/25/2015   aspirin 81 MG chewable tablet Chew 81 mg by mouth daily.   Bioflavonoid Products (VITAMIN C) CHEW Chew 2 tablets by mouth daily.   calcium carbonate (TUMS - DOSED IN MG ELEMENTAL CALCIUM) 500 MG chewable tablet Chew 1 tablet by mouth daily as needed for indigestion or heartburn.    Calcium-Vitamin A-Vitamin D (LIQUID CALCIUM PO) Take 15 mLs by mouth daily.   FLUZONE HIGH-DOSE 0.5 ML SUSY Inject 1 Dose as directed once.   ibandronate (BONIVA) 150 MG tablet Boniva   Multiple Vitamin (MULTIVITAMIN WITH MINERALS) TABS tablet Take 1 tablet by mouth daily.   sodium chloride (OCEAN) 0.65 % nasal spray Place 1 spray into the nose as needed. Allergies   Triamcinolone Acetonide (NASACORT ALLERGY 24HR NA) Place 1 spray into the nose daily as needed (allergies).   [DISCONTINUED] amoxicillin (AMOXIL) 500 MG capsule Take 2,000 mg by mouth as directed. Take '2000mg'$  one hour prior to  appointment   [DISCONTINUED] warfarin (COUMADIN) 5 MG tablet Take 1 tablet (5 mg total) by mouth one time only at 6 PM.   No facility-administered encounter medications on file as of 02/20/2021.  :   Review of Systems:  Out of a complete 14 point review of systems, all are reviewed and negative with the exception of these symptoms as listed below:   Review of Systems  Neurological:        Pt is here for tremors  Pt states that her tremors are early in the morning. Pt states that tremors are in hands and some whole body. Pt states it can be difficult for her to write .     Objective:  Neurological Exam  Physical Exam Physical Examination:   Vitals:   02/20/21 1000  BP: 111/71  Pulse: 80   General Examination: The patient is a very pleasant 80 y.o. female in no acute distress. She appears well-developed and well-nourished and well groomed.   HEENT: Normocephalic, atraumatic, pupils are equal, round and reactive to light, extraocular tracking is good without limitation to gaze excursion or nystagmus noted. Hearing is grossly intact. Face is symmetric with normal facial animation. Speech is clear with no dysarthria noted. There is no hypophonia. There is no lip, neck/head, jaw or voice tremor. Neck is supple with full range of passive and active motion. There are no carotid bruits on auscultation.  Oropharynx exam reveals: mild mouth dryness, adequate dental hygiene. Tongue protrudes centrally and palate elevates symmetrically.   Chest: Clear to auscultation without wheezing, rhonchi or crackles noted.  Heart: S1+S2+0, regular and normal without murmurs, rubs or gallops noted.   Abdomen: Soft, non-tender and non-distended with normal bowel sounds appreciated on auscultation.  Extremities: There is no pitting edema in the distal lower extremities bilaterally.   Skin: Warm and dry without trophic changes noted.  Varicose veins noted distal lower extremities bilaterally, right more than left.  Mild discoloration of the toes bilaterally.  Musculoskeletal: exam reveals mild medial right knee swelling, mild decrease in range of motion in both shoulders, mild upper body tilt to the right, possible mild scoliosis.  Arthritic changes in the hands.    Neurologically:  Mental status: The patient is awake, alert and oriented in all 4 spheres. Her immediate and remote memory, attention, language skills and fund of knowledge are appropriate. There is no evidence of aphasia, agnosia, apraxia or anomia. Speech is clear with normal prosody and enunciation. Thought process is linear. Mood is normal and affect is normal.  Cranial nerves II - XII are as described above under HEENT exam.  Motor exam: Normal bulk, strength and tone is noted. There is no resting tremor, she has a very mild bilateral upper extremity right more than left postural tremor, no significant action tremor, no intention tremor.    On 02/20/2021: On Archimedes spiral drawing she has no significant trembling with the right hand, mild insecurity with the left hand, handwriting is legible, on the smaller side, not particularly tremulous though.  Romberg is not tested due to safety concerns, reflexes are 1+ in the upper extremities, trace in the right knee, absent in the left knee, absent in both ankles.  Toes are downgoing bilaterally.  Fine  motor skills with finger taps and hand movements and rapid alternating patting are normal bilaterally.  Foot taps and foot agility normal for age bilaterally.   Cerebellar testing: No dysmetria or intention tremor.  Finger-to-nose without problem bilaterally.  There is no truncal  or gait ataxia.  Sensory exam: intact to light touch in the upper and lower extremities.  Gait, station and balance: She stands without difficulty.  She has a fairly age-appropriate posture, slight upper body tilt to the right and possible scoliosis which appears to be mild.  She walks with preserved arm swing, no shuffling, no limp.   Assessment and Plan:   In summary, SHAKEETA MUSICK is a very pleasant 80 y.o.-year old female with an underlying medical history of allergic rhinitis, vertigo, balance problem, and osteoarthritis, with status post multiple surgeries including bilateral rotator cuff repairs, neck surgery, lower back surgery, left knee replacement, and right hip replacement, status post fall with injuries in September 2021, who presents for evaluation of her hand tremors of approximately 1 years duration.  On examination, she has a mild bilateral upper extremity postural tremor, no resting tremor, no evidence of parkinsonism.  She is largely reassured today.  We talked about common tremor triggers and hereditary tremors quite a bit today.  Given that she has a family history of tremors in her father, she may have a mild form of essential tremor.  We talked about exacerbation of tremors with certain conditions including dehydration, sleep deprivation and stress.  She does admit to having stress in the recent past.  She never tried the low-dose atenolol, she is encouraged to try it, she had a prescription through your office.  She is advised again about the possible common side effects including low blood pressure and pulse, lightheadedness, dizziness, sleepiness.  She is advised to follow-up in this office in about 4 months,  we also talked about a possible alternative treatment in the form of Mysoline but I would not favor this medication for her due to higher side effect risk.  She is agreeable to trying the low-dose atenolol with the prescription she has.  She had a follow-up appointment with your office in late August and I was able to review her instructions.  She is encouraged to call with any interim questions or concerns and we will plan to reevaluate her in about 4 months in this office.  I answered all her questions today and the patient and her son were in agreement.   Thank you very much for allowing me to participate in the care of this nice patient. If I can be of any further assistance to you please do not hesitate to call me at [phone].  Sincerely,   Star Age, MD, PhD

## 2021-02-21 ENCOUNTER — Other Ambulatory Visit: Payer: Self-pay

## 2021-02-21 ENCOUNTER — Ambulatory Visit: Payer: Medicare Other | Admitting: Physical Therapy

## 2021-02-21 ENCOUNTER — Encounter: Payer: Self-pay | Admitting: Physical Therapy

## 2021-02-21 DIAGNOSIS — R2681 Unsteadiness on feet: Secondary | ICD-10-CM

## 2021-02-21 DIAGNOSIS — M25561 Pain in right knee: Secondary | ICD-10-CM

## 2021-02-21 DIAGNOSIS — M25562 Pain in left knee: Secondary | ICD-10-CM | POA: Diagnosis not present

## 2021-02-21 DIAGNOSIS — R262 Difficulty in walking, not elsewhere classified: Secondary | ICD-10-CM

## 2021-02-21 DIAGNOSIS — M545 Low back pain, unspecified: Secondary | ICD-10-CM | POA: Diagnosis not present

## 2021-02-21 DIAGNOSIS — M6281 Muscle weakness (generalized): Secondary | ICD-10-CM | POA: Diagnosis not present

## 2021-02-21 NOTE — Therapy (Signed)
[facility]. [place], [place], [address] Phone: [phone]   Fax:  [phone] Progress Note Reporting Period 02/23/2021 to 02/21/21 for the first 10 visits   See note below for Objective Data and Assessment of Progress/Goals.     Physical Therapy Treatment  Patient Details  Name: Marie Coleman MRN: [id number] Date of Birth: Oct 23, 1940 Referring Provider (PT): Rolena Infante   Encounter Date: 02/21/2021   PT End of Session - 02/21/21 0924     Visit Number 10    Date for PT Re-Evaluation 04/03/21    PT Start Time 0844    PT Stop Time 0927    PT Time Calculation (min) 43 min    Activity Tolerance Patient tolerated treatment well    Behavior During Therapy WFL for tasks assessed/performed             Past Medical History:  Diagnosis Date   Arthritis    OA AND PAIN LEFT KNEE AND PAIN IN SHOULDERS, HANDS, LOWER BACK   BPV (benign positional vertigo)    Osteoporosis    Seasonal allergies    Tremor    Vertigo     Past Surgical History:  Procedure Laterality Date   ABDOMINAL HYSTERECTOMY  06/18/1975   BACK SURGERY     CATARACT EXTRACTION, BILATERAL Bilateral 2019   HIP PINNING,CANNULATED  05/15/2011   Procedure: CANNULATED HIP PINNING;  Surgeon: Tobi Bastos;  Location: [facility];  Service: Orthopedics;  Laterality: Right;  Cannulated Right Hip Pinning   (c-arm)    LEFT ROTATOR CUFF REPAIR JAN 2012     LUMBAR SURGERY FOR HNP 1990'S     ORIF LEFT WRIST  1990'S     ROTATOR CUFF REPAIR Right 08/2012   SURGERY FOR NECK SPURS 1980'S     TOTAL HIP REVISION  04/20/2012   Procedure: TOTAL HIP REVISION;  Surgeon: Gearlean Alf, MD;  Location: [facility];  Service: Orthopedics;  Laterality: Right;  CONVERSION OF  PREVIOUS HIP SURGERY   TOTAL KNEE ARTHROPLASTY  09/23/2011   Procedure: TOTAL KNEE ARTHROPLASTY;  Surgeon: Gearlean Alf, MD;  Location: [facility];  Service: Orthopedics;  Laterality: Left;    There were no vitals  filed for this visit.   Subjective Assessment - 02/21/21 0851     Subjective Patient reports that the right medial knee is still really hurting with walking and standing, reports that she feels like she needs to not stress the knee    Currently in Pain? Yes    Pain Score 0-No pain    Pain Location Knee   back pain 0/10 at rest, 5/10 with ADL's   Pain Orientation Right;Medial    Pain Descriptors / Indicators Aching    Aggravating Factors  walking, each step on the right 7/10                               [facility] Adult PT Treatment/Exercise - 02/21/21 0001       High Level Balance   High Level Balance Comments on airex ball toss, head turns, eyes closed, airex balance beam side stepping and tandem, eyes closed with head turns      Lumbar Exercises: Stretches   Passive Hamstring Stretch Right;Left;3 reps;20 seconds    Piriformis Stretch Right;Left;3 reps;20 seconds    Gastroc Stretch Right;Left;5 reps;20 seconds      Lumbar Exercises: Seated   Other Seated Lumbar  Exercises red tband ankle PF/DF, HS curls    Other Seated Lumbar Exercises red tband rows and extension      Lumbar Exercises: Supine   Other Supine Lumbar Exercises feet on ball K2C, trunk rotation, small bridge and isometric abs      Lumbar Exercises: Sidelying   Clam Both;20 reps;1 second    Clam Limitations 3#                  Upper Extremity Functional Index Score :   /80     PT Short Term Goals - 01/08/21 1049       PT SHORT TERM GOAL #1   Title independent with initial HEP    Status Achieved               PT Long Term Goals - 02/16/21 0932       PT LONG TERM GOAL #1   Title cook a meal without pain or fatigue    Status Partially Met                   Plan - 02/21/21 0924     Clinical Impression Statement Really working on treatment to avoid the stress on teh knee, standing and knee activity of weight bearing increases pain, She is very tight in the  calves and the HS.  She will have an MRI this weekend on her back.  We will try to continue to add exercises but avoid the knee pain if possible, may decrease to 1x/week and see what MRI says    PT Next Visit Plan continue to work on strengtrh and balance adjust according to the knee    Consulted and Agree with Plan of Care Patient             Patient will benefit from skilled therapeutic intervention in order to improve the following deficits and impairments:  Decreased range of motion, Difficulty walking, Increased muscle spasms, Decreased endurance, Decreased activity tolerance, Pain, Decreased balance, Decreased mobility, Decreased strength, Postural dysfunction  Visit Diagnosis: Acute bilateral low back pain without sciatica  Acute pain of left knee  Acute pain of right knee  Difficulty in walking, not elsewhere classified  Muscle weakness (generalized)  Unsteadiness on feet     Problem List Patient Active Problem List   Diagnosis Date Noted   Avascular necrosis of bone of hip ([place]) 04/20/2012   Postop Transfusion  09/26/2011   Postop Hyponatremia 09/26/2011   Postop Acute blood loss anemia 09/25/2011   OA (osteoarthritis) of knee 09/23/2011   Hip fracture, right ([place]) 05/14/2011    Class: Acute    Sumner Boast, PT 02/21/2021, 9:27 AM  [facility]. [place], [place], [address] Phone: [phone]   Fax:  [phone]  Name: Marie Coleman MRN: [id number] Date of Birth: 1940/06/22

## 2021-02-23 ENCOUNTER — Ambulatory Visit: Payer: Medicare Other | Admitting: Physical Therapy

## 2021-02-23 ENCOUNTER — Other Ambulatory Visit: Payer: Self-pay

## 2021-02-23 ENCOUNTER — Encounter: Payer: Self-pay | Admitting: Physical Therapy

## 2021-02-23 DIAGNOSIS — M25561 Pain in right knee: Secondary | ICD-10-CM

## 2021-02-23 DIAGNOSIS — R262 Difficulty in walking, not elsewhere classified: Secondary | ICD-10-CM | POA: Diagnosis not present

## 2021-02-23 DIAGNOSIS — M545 Low back pain, unspecified: Secondary | ICD-10-CM

## 2021-02-23 DIAGNOSIS — R2681 Unsteadiness on feet: Secondary | ICD-10-CM

## 2021-02-23 DIAGNOSIS — M6281 Muscle weakness (generalized): Secondary | ICD-10-CM

## 2021-02-23 DIAGNOSIS — M25562 Pain in left knee: Secondary | ICD-10-CM | POA: Diagnosis not present

## 2021-02-23 NOTE — Therapy (Signed)
[facility]. [place], [place], [address] Phone: [phone]   Fax:  [phone]  Physical Therapy Treatment  Patient Details  Name: Marie Coleman MRN: [id number] Date of Birth: 1940/07/13 Referring Provider (PT): Rolena Infante   Encounter Date: 02/23/2021   PT End of Session - 02/23/21 1000     Visit Number 11    Date for PT Re-Evaluation 04/03/21    PT Start Time 0844    PT Stop Time 0928    PT Time Calculation (min) 44 min    Activity Tolerance Patient tolerated treatment well    Behavior During Therapy [facility] for tasks assessed/performed             Past Medical History:  Diagnosis Date   Arthritis    OA AND PAIN LEFT KNEE AND PAIN IN SHOULDERS, HANDS, LOWER BACK   BPV (benign positional vertigo)    Osteoporosis    Seasonal allergies    Tremor    Vertigo     Past Surgical History:  Procedure Laterality Date   ABDOMINAL HYSTERECTOMY  06/18/1975   BACK SURGERY     CATARACT EXTRACTION, BILATERAL Bilateral 2019   HIP PINNING,CANNULATED  05/15/2011   Procedure: CANNULATED HIP PINNING;  Surgeon: Tobi Bastos;  Location: [facility];  Service: Orthopedics;  Laterality: Right;  Cannulated Right Hip Pinning   (c-arm)    LEFT ROTATOR CUFF REPAIR JAN 2012     LUMBAR SURGERY FOR HNP 1990'S     ORIF LEFT WRIST  1990'S     ROTATOR CUFF REPAIR Right 08/2012   SURGERY FOR NECK SPURS 1980'S     TOTAL HIP REVISION  04/20/2012   Procedure: TOTAL HIP REVISION;  Surgeon: Gearlean Alf, MD;  [facility];  Service: Orthopedics;  Laterality: Right;  CONVERSION OF  PREVIOUS HIP SURGERY   TOTAL KNEE ARTHROPLASTY  09/23/2011   Procedure: TOTAL KNEE ARTHROPLASTY;  Surgeon: Gearlean Alf, MD;  [facility];  Service: Orthopedics;  Laterality: Left;    There were no vitals filed for this visit.   Subjective Assessment - 02/23/21 0850     Subjective Taking it easy has been good for my knee, less pain yesterday and the  best i have felt today    Currently in Pain? No/denies                               [facility] Adult PT Treatment/Exercise - 02/23/21 0001       High Level Balance   High Level Balance Comments on airex ball toss, head turns, eyes closed, airex balance beam side stepping and tandem, eyes closed with head turns      Lumbar Exercises: Stretches   Passive Hamstring Stretch Right;Left;3 reps;20 seconds    Piriformis Stretch Right;Left;3 reps;20 seconds    Gastroc Stretch Right;Left;5 reps;20 seconds      Lumbar Exercises: Seated   Other Seated Lumbar Exercises red tband ankle PF/DF, HS curls    Other Seated Lumbar Exercises red tband rows and extension, ball b/n knees squeeze      Lumbar Exercises: Supine   Other Supine Lumbar Exercises feet on ball K2C, trunk rotation, small bridge and isometric abs      Lumbar Exercises: Sidelying   Clam Both;20 reps;1 second    Clam Limitations 3#  PT Short Term Goals - 01/08/21 1049       PT SHORT TERM GOAL #1   Title independent with initial HEP    Status Achieved               PT Long Term Goals - 02/23/21 1010       PT LONG TERM GOAL #1   Title cook a meal without pain or fatigue    Status Partially Met      PT LONG TERM GOAL #2   Title understand posture and body mechanics    Status Partially Met                   Plan - 02/23/21 1000     Clinical Impression Statement Doing better, less knee pain, still has worries about carry over and pain in the knee as well as balance, I think she is feeling better with this and we will look at a plan for her on her own.  Still some knee pain with activity    PT Next Visit Plan continue to work on [place] and balance adjust according to the knee    Consulted and Agree with Plan of Care Patient             Patient will benefit from skilled therapeutic intervention in order to improve the following deficits and  impairments:  Decreased range of motion, Difficulty walking, Increased muscle spasms, Decreased endurance, Decreased activity tolerance, Pain, Decreased balance, Decreased mobility, Decreased strength, Postural dysfunction  Visit Diagnosis: Acute bilateral low back pain without sciatica  Acute pain of left knee  Acute pain of right knee  Difficulty in walking, not elsewhere classified  Muscle weakness (generalized)  Unsteadiness on feet     Problem List Patient Active Problem List   Diagnosis Date Noted   Avascular necrosis of bone of hip ([place]) 04/20/2012   Postop Transfusion  09/26/2011   Postop Hyponatremia 09/26/2011   Postop Acute blood loss anemia 09/25/2011   OA (osteoarthritis) of knee 09/23/2011   Hip fracture, right ([place]) 05/14/2011    Class: Acute    Sumner Boast, PT 02/23/2021, 10:11 AM  [facility]. [place], [place], [address] Phone: [phone]   Fax:  [phone]  Name: Marie Coleman MRN: [id number] Date of Birth: December 14, 1940

## 2021-02-27 ENCOUNTER — Encounter: Payer: Self-pay | Admitting: Physical Therapy

## 2021-02-27 ENCOUNTER — Other Ambulatory Visit: Payer: Self-pay

## 2021-02-27 ENCOUNTER — Ambulatory Visit: Payer: Medicare Other | Admitting: Physical Therapy

## 2021-02-27 DIAGNOSIS — M6281 Muscle weakness (generalized): Secondary | ICD-10-CM | POA: Diagnosis not present

## 2021-02-27 DIAGNOSIS — M25561 Pain in right knee: Secondary | ICD-10-CM

## 2021-02-27 DIAGNOSIS — R262 Difficulty in walking, not elsewhere classified: Secondary | ICD-10-CM

## 2021-02-27 DIAGNOSIS — R2681 Unsteadiness on feet: Secondary | ICD-10-CM

## 2021-02-27 DIAGNOSIS — M545 Low back pain, unspecified: Secondary | ICD-10-CM | POA: Diagnosis not present

## 2021-02-27 DIAGNOSIS — M25562 Pain in left knee: Secondary | ICD-10-CM | POA: Diagnosis not present

## 2021-02-27 NOTE — Therapy (Signed)
[place]. [place], [place], [address] Phone: [phone]   Fax:  [phone]  Physical Therapy Treatment  Patient Details  Name: Marie Coleman MRN: [id number] Date of Birth: [birth year] Referring Provider (PT): Rolena Infante   Encounter Date: 02/27/2021   PT End of Session - 02/27/21 1611     Visit Number 12    Date for PT Re-Evaluation 04/03/21    PT Start Time 1528    PT Stop Time 1612    PT Time Calculation (min) 44 min    Activity Tolerance Patient tolerated treatment well    Behavior During Therapy [facility] for tasks assessed/performed             Past Medical History:  Diagnosis Date   Arthritis    OA AND PAIN LEFT KNEE AND PAIN IN SHOULDERS, HANDS, LOWER BACK   BPV (benign positional vertigo)    Osteoporosis    Seasonal allergies    Tremor    Vertigo     Past Surgical History:  Procedure Laterality Date   ABDOMINAL HYSTERECTOMY  06/18/1975   BACK SURGERY     CATARACT EXTRACTION, BILATERAL Bilateral 2019   HIP PINNING,CANNULATED  05/15/2011   Procedure: CANNULATED HIP PINNING;  Surgeon: Tobi Bastos;  Location: [facility];  Service: Orthopedics;  Laterality: Right;  Cannulated Right Hip Pinning   (c-arm)    LEFT ROTATOR CUFF REPAIR JAN 2012     LUMBAR SURGERY FOR HNP 1990'S     ORIF LEFT WRIST  1990'S     ROTATOR CUFF REPAIR Right 08/2012   SURGERY FOR NECK SPURS 1980'S     TOTAL HIP REVISION  04/20/2012   Procedure: TOTAL HIP REVISION;  Surgeon: Gearlean Alf, MD;  Location: [facility];  Service: Orthopedics;  Laterality: Right;  CONVERSION OF  PREVIOUS HIP SURGERY   TOTAL KNEE ARTHROPLASTY  09/23/2011   Procedure: TOTAL KNEE ARTHROPLASTY;  Surgeon: Gearlean Alf, MD;  Location: [facility];  Service: Orthopedics;  Laterality: Left;    There were no vitals filed for this visit.   Subjective Assessment - 02/27/21 1534     Subjective Reports that the knee is still irritated, any weightbearing still  hurts, the "things we have been doing I think are good, less pain overall and I feel better"    Currently in Pain? No/denies    Aggravating Factors  pain with walking                               [facility] Adult PT Treatment/Exercise - 02/27/21 0001       High Level Balance   High Level Balance Comments on airex ball toss, head turns, eyes closed, airex balance beam side stepping and tandem, eyes closed with head turns      Lumbar Exercises: Machines for Strengthening   Other Lumbar Machine Exercise 15# row and 15# lats 2x10      Lumbar Exercises: Seated   Other Seated Lumbar Exercises red tband ankle PF/DF, HS curls, yellow tband chest press    Other Seated Lumbar Exercises red tband rows and extension, ball b/n knees squeeze with hip ER with red tband      Lumbar Exercises: Supine   Other Supine Lumbar Exercises SAQ 3# 2x10 bilaterallyt    Other Supine Lumbar Exercises feet on ball K2C, trunk rotation, small bridge and isometric abs      Lumbar Exercises:  Sidelying   Clam Both;20 reps;1 second    Clam Limitations 3#                       PT Short Term Goals - 01/08/21 1049       PT SHORT TERM GOAL #1   Title independent with initial HEP    Status Achieved               PT Long Term Goals - 02/27/21 1614       PT LONG TERM GOAL #1   Title cook a meal without pain or fatigue    Status Partially Met                   Plan - 02/27/21 1612     Clinical Impression Statement Patient still reporting that her knees hurt but what we are doing is doing well for her overall function and safety.  She did have some arm fatigue with the exercises.  The nee hurts with weightbearing only, once she sits no pain    PT Next Visit Plan continue to work on strengtrh and balance adjust according to the knee    Consulted and Agree with Plan of Care Patient             Patient will benefit from skilled therapeutic intervention in order  to improve the following deficits and impairments:  Decreased range of motion, Difficulty walking, Increased muscle spasms, Decreased endurance, Decreased activity tolerance, Pain, Decreased balance, Decreased mobility, Decreased strength, Postural dysfunction  Visit Diagnosis: Acute bilateral low back pain without sciatica  Acute pain of left knee  Acute pain of right knee  Muscle weakness (generalized)  Difficulty in walking, not elsewhere classified  Unsteadiness on feet     Problem List Patient Active Problem List   Diagnosis Date Noted   Avascular necrosis of bone of hip ([place]) 04/20/2012   Postop Transfusion  09/26/2011   Postop Hyponatremia 09/26/2011   Postop Acute blood loss anemia 09/25/2011   OA (osteoarthritis) of knee 09/23/2011   Hip fracture, right ([place]) 05/14/2011    Class: Acute    Sumner Boast, PT 02/27/2021, 4:15 PM  [place]. [place], [place], [address] Phone: [phone]   Fax:  [phone]  Name: AYAKO TAPANES MRN: [id number] Date of Birth: 1940-07-31

## 2021-03-08 DIAGNOSIS — M5459 Other low back pain: Secondary | ICD-10-CM | POA: Diagnosis not present

## 2021-03-12 ENCOUNTER — Ambulatory Visit: Payer: Medicare Other | Admitting: Physical Therapy

## 2021-03-16 DIAGNOSIS — M5416 Radiculopathy, lumbar region: Secondary | ICD-10-CM | POA: Diagnosis not present

## 2021-03-16 DIAGNOSIS — M5136 Other intervertebral disc degeneration, lumbar region: Secondary | ICD-10-CM | POA: Diagnosis not present

## 2021-03-19 ENCOUNTER — Ambulatory Visit: Payer: Medicare Other | Admitting: Physical Therapy

## 2021-03-20 DIAGNOSIS — Z23 Encounter for immunization: Secondary | ICD-10-CM | POA: Diagnosis not present

## 2021-03-21 ENCOUNTER — Ambulatory Visit: Payer: Medicare Other | Admitting: Physical Therapy

## 2021-03-26 ENCOUNTER — Ambulatory Visit: Payer: Medicare Other | Admitting: Physical Therapy

## 2021-03-26 DIAGNOSIS — L820 Inflamed seborrheic keratosis: Secondary | ICD-10-CM | POA: Diagnosis not present

## 2021-03-26 DIAGNOSIS — D1801 Hemangioma of skin and subcutaneous tissue: Secondary | ICD-10-CM | POA: Diagnosis not present

## 2021-03-26 DIAGNOSIS — Z85828 Personal history of other malignant neoplasm of skin: Secondary | ICD-10-CM | POA: Diagnosis not present

## 2021-03-26 DIAGNOSIS — L814 Other melanin hyperpigmentation: Secondary | ICD-10-CM | POA: Diagnosis not present

## 2021-03-26 DIAGNOSIS — L821 Other seborrheic keratosis: Secondary | ICD-10-CM | POA: Diagnosis not present

## 2021-03-26 DIAGNOSIS — L570 Actinic keratosis: Secondary | ICD-10-CM | POA: Diagnosis not present

## 2021-03-28 ENCOUNTER — Ambulatory Visit: Payer: Medicare Other | Admitting: Physical Therapy

## 2021-04-18 ENCOUNTER — Telehealth: Payer: Self-pay | Admitting: Neurology

## 2021-04-18 NOTE — Telephone Encounter (Signed)
Py's son Marie Coleman called states his mother is wanting a increased dosage of the Atenolol, wanting to know if that is something Dr. Rexene Alberts would recommend. Richard requesting a call back.

## 2021-04-19 NOTE — Telephone Encounter (Signed)
Spoke with patient's son [name] (on [place]).  He states the patient started the atenolol 25 mg taking half tablet this same week that pt had office visit with Dr Rexene Alberts early September 2022.  The patient has reported to him that she does not feel the half tablet is effective and ask if it makes sense to increase the dosage to a full 25 mg.  She did not report any side effects to him.  He is asking if Dr. Rexene Alberts would prescribe this.  I let him know a message will be sent to Dr. Rexene Alberts and we will call him back.  He was very [employer] attorney.  Patient has a follow-up with Dr. Rexene Alberts on June 26, 2021.

## 2021-04-19 NOTE — Telephone Encounter (Signed)
Okay to prescribe atenolol 25 mg once daily so long as patient does not have any lightheadedness and blood pressure has not dropped, as she did have a low normal blood pressure when I saw her in September.  Please double check with the patient or her son and send a prescription for atenolol 25 mg once daily to her pharmacy, 30 days with 3 refills.

## 2021-04-20 DIAGNOSIS — E46 Unspecified protein-calorie malnutrition: Secondary | ICD-10-CM | POA: Diagnosis not present

## 2021-04-20 DIAGNOSIS — R251 Tremor, unspecified: Secondary | ICD-10-CM | POA: Diagnosis not present

## 2021-04-20 NOTE — Telephone Encounter (Signed)
Pt's son, [name] calling asking what message was sent. Could not open the MyChart to see the message.   Relayed to message sent t MyChart, he verbalized understanding to check blood pressure and pulse too make sure not too low.

## 2021-04-23 NOTE — Telephone Encounter (Signed)
LMVM for [facility] to return call re: pt last visit, Bp  and atenolol increase.  She will have nurse to call back.

## 2021-04-23 NOTE — Telephone Encounter (Signed)
Pt's son, Aliviyah Malanga (on [place]) called, PCP prescribed Atenolol increase to 25mg  per day. Would like a call back to make sure this increase is ok.   Could you please call this number [phone] instead of sending throw MyChart portal?

## 2021-04-25 NOTE — Telephone Encounter (Signed)
Nothing further needed, thank you.

## 2021-04-25 NOTE — Telephone Encounter (Signed)
[facility] called back and stated the patient saw Dr. Harrington Challenger on 04/20/2021.  Her vital signs that day were BP 117/67 and HR 74.  Dr. Harrington Challenger refilled her Atenolol 25 mg and prescribed 1 tablet/day.  Patient to call for any side effects.  He mentioned she can always go back to half tablet.  He provided patient with 90 tablets and 2 refills.  I let the assistant know I would update Dr. Rexene Alberts and we will only call back if there are any issues.  She verbalized appreciation.

## 2021-05-01 DIAGNOSIS — M25561 Pain in right knee: Secondary | ICD-10-CM | POA: Diagnosis not present

## 2021-06-05 DIAGNOSIS — Z23 Encounter for immunization: Secondary | ICD-10-CM | POA: Diagnosis not present

## 2021-06-26 ENCOUNTER — Encounter: Payer: Self-pay | Admitting: Neurology

## 2021-06-26 ENCOUNTER — Ambulatory Visit (INDEPENDENT_AMBULATORY_CARE_PROVIDER_SITE_OTHER): Payer: Medicare Other | Admitting: Neurology

## 2021-06-26 VITALS — BP 105/57 | HR 58 | Ht 66.0 in | Wt 109.4 lb

## 2021-06-26 DIAGNOSIS — R42 Dizziness and giddiness: Secondary | ICD-10-CM | POA: Diagnosis not present

## 2021-06-26 DIAGNOSIS — R251 Tremor, unspecified: Secondary | ICD-10-CM | POA: Diagnosis not present

## 2021-06-26 DIAGNOSIS — R001 Bradycardia, unspecified: Secondary | ICD-10-CM | POA: Diagnosis not present

## 2021-06-26 DIAGNOSIS — F439 Reaction to severe stress, unspecified: Secondary | ICD-10-CM | POA: Diagnosis not present

## 2021-06-26 NOTE — Patient Instructions (Addendum)
It was nice to see you both again today.  Since you have not noticed any telltale benefit from the atenolol and you have had some potential side effects including lightheadedness and feeling off balance, lower blood pressure and pulse values, I recommend that you taper off of the atenolol.  You can take half a pill for 6 to 8 days and then stop it altogether.  I recommend you follow-up with your primary care to talk about stress management and anxiety. At this juncture, I recommend you follow-up closely with your primary care.  I would be happy to see you back in this clinic on an as-needed basis. I wish you all the best with your transition to [facility] independent living.

## 2021-06-26 NOTE — Progress Notes (Signed)
Subjective:    Patient ID: Marie Coleman is a 81 y.o. female.  HPI    Interim history:   Marie Coleman is an 81 year old right-handed woman with an underlying medical history of allergic rhinitis, vertigo, balance problem, and osteoarthritis, with status post multiple surgeries including bilateral rotator cuff repairs, neck surgery, lower back surgery, left knee replacement, and right hip replacement, status post fall with injuries in September 2021, who presents for follow-up consultation of Marie Coleman hand tremors.  The patient is accompanied by Marie Coleman son again today.  I first met them on 02/20/2021 at the request of Marie Coleman primary care physician, at which time she reported an approximately 1 year history of bilateral hand tremors.  Examination was overall quite benign, we talked about tremor triggers and stress may have been a trigger, she had a prescription for atenolol 25 mg strength half a pill daily through Marie Coleman primary care physician and I encouraged Marie Coleman to try it.  They requested in the interim advice regarding the atenolol and whether it would be ok to increase it to 25 mg strength once daily.  They were encouraged to do so and Marie Coleman primary care office provided a prescription for atenolol 25 mg daily.  Today, 06/26/2021: She reports intermittent results from taking the atenolol, she continues to take 25 mg daily.  She has had significant stress, they are about to move into independent living and Marie Coleman husband has dementia.  She feels that Marie Coleman tremor has become worse in fact.  She has had some lightheadedness especially upon standing and sometimes feels off balance like she is going to fall backwards.  She has not had any falls.  She admits that she does not drink enough water, she averages about 4 cups of water on any given day, she drinks 1 cup of coffee and 1 serving of juice during the day and 1 serving of tea typically.  Primary care is about to move away, she will have to establish with a new primary care  physician as I understand.  The patient's allergies, current medications, family history, past medical history, past social history, past surgical history and problem list were reviewed and updated as appropriate.  Previously:   02/20/21: (She) reports an approximately 1 year history of bilateral hand tremors.  Tremors have been more or less stable but seem to be worse when she is holding something or feeding herself or when she gets anxious or stressed out.  She does report recent stress in the past several months.  She and Marie Coleman husband are planning to move into [facility] independent living.  He has been diagnosed with dementia.  I reviewed your office note from 04/19/2020.  She was prescribed atenolol at the time, 25 mg strength half the pill once daily.  She reports that she never filled the prescription.  She does report issues with Marie Coleman balance and with intermittent vertigo and feeling dizzy.  Marie Coleman son suspects that that is why she never filled the atenolol prescription but she does not remember exactly why she never tried it.  She does not drink caffeine on a day-to-day basis, typically drinks decaf coffee, 1 cup in the morning and occasional tea in the middle of the day.  She does not hydrate very well by self admission and reports that she drinks maybe 2 large cups of water per day, estimated to be a total of 32 ounces.  She does not drink alcohol regularly.  She lives with Marie Coleman husband, she has 2 grown  children, she has no family history of Parkinson's disease.  Marie Coleman father had a hand tremor.  She has 1 brother, he does not have a tremor.  She sees orthopedics on a regular basis and is scheduled to see Dr. Nelva Bush.  She had a remote brain MRI on 02/02/2008 with and without contrast for evaluation of vertigo and I reviewed the results:  IMPRESSION:  1.  No evidence of acute ischemia.  2.  Focus of hyperintensity in the right paramedian pons.  This is  nonspecific in terms of etiology.  Differential  considerations  would be changes due to small vessel disease due to hypertension  and/or diabetes.  Less likely possibility of a demyelinating  process or vasculitis.  3.  No abnormal enhancement, mass effect or vascular loops seen in  the cerebellopontine angle regions.  4.  Mild mucosal thickening in the ethmoids and the frontal  sinuses.     She had a head CT and maxillofacial CT without contrast on 06/25/2015 with indication of left temporal headache and I reviewed the results: IMPRESSION: CT HEAD   1. Negative CT FACE   1. Negative   Marie Coleman Past Medical History Is Significant For: Past Medical History:  Diagnosis Date   Arthritis    OA AND PAIN LEFT KNEE AND PAIN IN SHOULDERS, HANDS, LOWER BACK   BPV (benign positional vertigo)    Osteoporosis    Seasonal allergies    Tremor    Vertigo     Marie Coleman Past Surgical History Is Significant For: Past Surgical History:  Procedure Laterality Date   ABDOMINAL HYSTERECTOMY  06/18/1975   BACK SURGERY     CATARACT EXTRACTION, BILATERAL Bilateral 2019   HIP PINNING,CANNULATED  05/15/2011   Procedure: CANNULATED HIP PINNING;  Surgeon: Tobi Bastos;  [facility];  Service: Orthopedics;  Laterality: Right;  Cannulated Right Hip Pinning   (c-arm)    LEFT ROTATOR CUFF REPAIR JAN 2012     LUMBAR SURGERY FOR HNP 1990'S     ORIF LEFT WRIST  1990'S     ROTATOR CUFF REPAIR Right 08/2012   SURGERY FOR NECK SPURS 1980'S     TOTAL HIP REVISION  04/20/2012   Procedure: TOTAL HIP REVISION;  Surgeon: Gearlean Alf, MD;  Location: [facility];  Service: Orthopedics;  Laterality: Right;  CONVERSION OF  PREVIOUS HIP SURGERY   TOTAL KNEE ARTHROPLASTY  09/23/2011   Procedure: TOTAL KNEE ARTHROPLASTY;  Surgeon: Gearlean Alf, MD;  Location[facility];  Service: Orthopedics;  Laterality: Left;    Marie Coleman Family History Is Significant For: Family History  Problem Relation Age of Onset   Dementia Mother    Heart attack Father    Tremor Neg Hx     Marie Coleman  Social History Is Significant For: Social History   Socioeconomic History   Marital status: Married    Spouse name: Not on file   Number of children: 1   Years of education: Not on file   Highest education level: Not on file  Occupational History   Not on file  Tobacco Use   Smoking status: Never   Smokeless tobacco: Never  Vaping Use   Vaping Use: Never used  Substance and Sexual Activity   Alcohol use: Yes    Comment: OCCAS mixed drink   Drug use: No   Sexual activity: Not on file  Other Topics Concern   Not on file  Social History Narrative   Caffeine [employer]   Social Determinants of  Health   Financial Resource Strain: Not on file  Food Insecurity: Not on file  Transportation Needs: Not on file  Physical Activity: Not on file  Stress: Not on file  Social Connections: Not on file    Marie Coleman Allergies Are:  Allergies  Allergen Reactions   Sulfa Antibiotics Hives  :   Marie Coleman Current Medications Are:  Outpatient Encounter Medications as of 06/26/2021  Medication Sig   acetaminophen (TYLENOL) 500 MG tablet Take 500 mg by mouth daily as needed for moderate pain or headache. Reported on 06/25/2015   aspirin 81 MG chewable tablet Chew 81 mg by mouth daily.   atenolol (TENORMIN) 25 MG tablet Take 25 mg by mouth daily.   Bioflavonoid Products (VITAMIN C) CHEW Chew 2 tablets by mouth daily.   calcium carbonate (TUMS - DOSED IN MG ELEMENTAL CALCIUM) 500 MG chewable tablet Chew 1 tablet by mouth daily as needed for indigestion or heartburn.   Calcium-Vitamin A-Vitamin D (LIQUID CALCIUM PO) Take 15 mLs by mouth daily.   FLUZONE HIGH-DOSE 0.5 ML SUSY Inject 1 Dose as directed once.   ibandronate (BONIVA) 150 MG tablet Boniva   Multiple Vitamin (MULTIVITAMIN WITH MINERALS) TABS tablet Take 1 tablet by mouth daily.   sodium chloride (OCEAN) 0.65 % nasal spray Place 1 spray into the nose as needed. Allergies   Triamcinolone Acetonide (NASACORT ALLERGY 24HR NA) Place 1 spray into the nose  daily as needed (allergies).   [DISCONTINUED] warfarin (COUMADIN) 5 MG tablet Take 1 tablet (5 mg total) by mouth one time only at 6 PM.   No facility-administered encounter medications on file as of 06/26/2021.  :  Review of Systems:  Out of a complete 14 point review of systems, all are reviewed and negative with the exception of these symptoms as listed below:   Review of Systems  Neurological:        Worsening hand tremors, no improvement with taking 1 tablet atenolol.  Has increased stress/ anxiety.    Objective:  Neurological Exam  Physical Exam Physical Examination:   Vitals:   06/26/21 0841  BP: (!) 105/57  Pulse: (!) 58    General Examination: The patient is a very pleasant 81 y.o. female in no acute distress. She appears well-developed and well-nourished and well groomed.   HEENT: Normocephalic, atraumatic, pupils are equal, round and reactive to light, extraocular tracking is good without limitation to gaze excursion or nystagmus noted. Hearing is grossly intact. Face is symmetric with normal facial animation. Speech is clear with no dysarthria noted. There is no hypophonia. There is no lip, neck/head, jaw or voice tremor. Neck is supple with full range of passive and active motion. There are no carotid bruits on auscultation.    Chest: Clear to auscultation without wheezing, rhonchi or crackles noted.   Heart: S1+S2+0, regular and normal without murmurs, rubs or gallops noted.  Mild bradycardia.   Abdomen: Soft, non-tender and non-distended.   Extremities: There is no pitting edema in the distal lower extremities bilaterally.    Skin: Warm and dry without trophic changes noted.  Varicose veins noted distal lower extremities bilaterally, right more than left.  Mild discoloration of the toes bilaterally.    Musculoskeletal: exam reveals mild right knee swelling, mild decrease in range of motion in both shoulders, mild upper body tilt to the right, possible mild  scoliosis.  Arthritic changes in the hands, all stable.     Neurologically:  Mental status: The patient is awake, alert and oriented  in all 4 spheres. Marie Coleman immediate and remote memory, attention, language skills and fund of knowledge are appropriate. There is no evidence of aphasia, agnosia, apraxia or anomia. Speech is clear with normal prosody and enunciation. Thought process is linear. Mood is normal and affect is normal.  Cranial nerves II - XII are as described above under HEENT exam.  Motor exam: Normal bulk, strength and tone is noted. There is no resting tremor, she has a very mild bilateral upper extremity right more than left postural tremor, no significant action tremor, no intention tremor.     (On 02/20/2021: On Archimedes spiral drawing she has no significant trembling with the right hand, mild insecurity with the left hand, handwriting is legible, on the smaller side, not particularly tremulous though.)   Romberg is not tested due to safety concerns. Fine motor skills with finger taps and hand movements and rapid alternating patting are normal bilaterally.  Trigger finger noted in right hand. Foot taps normal for age bilaterally.   Cerebellar testing: No dysmetria or intention tremor. There is no truncal or gait ataxia.  Sensory exam: intact to light touch in the upper and lower extremities.  Gait, station and balance: She stands without difficulty.  She feels slightly lightheaded upon standing.  She has a fairly age-appropriate posture, slight upper body tilt to the right and possible scoliosis which appears to be mild.  She walks with preserved arm swing, no shuffling, no limp.  No walking aid.   Assessment and Plan:    In summary, NAELA NODAL is a very pleasant 81 year old female with an underlying medical history of allergic rhinitis, vertigo, balance problem, and osteoarthritis, with status post multiple surgeries including bilateral rotator cuff repairs, neck surgery, lower back  surgery, left knee replacement, and right hip replacement, status post fall with injuries in September 2021, who presents for follow-up consultation for tremors of over 1 years duration.  Stress and anxiety have been triggers, she may have an enhanced physiological tremor versus a mild form and late onset of essential tremor, what with Marie Coleman family history of tremors affecting Marie Coleman father.  No evidence of parkinsonism and she is reassured in that regard.  She did not have any telltale improvement with atenolol low-dose but had some side effects including lightheadedness.  She has a mildly low blood pressure today and mild bradycardia.  We mutually agreed to have Marie Coleman come off the atenolol, she is encouraged to take half a pill for 6 to 8 days and then stop it altogether.  We talked about the importance of stress management.  She is encouraged to talk to Marie Coleman primary care physician about anxiety and stress management.  She is advised to stay well-hydrated with water and increase Marie Coleman water intake.  Unfortunately, alternative treatment options for symptomatic tremor control are limited for Marie Coleman. I would not favor Mysoline for Marie Coleman due to higher side effect risk.  At this juncture, she is advised to follow-up with Marie Coleman primary care physician.  I can see Marie Coleman back in this clinic on an as-needed basis.  I answered all the questions today and the patient did Marie Coleman son were in agreement.  I spent 30 minutes in total face-to-face time and in reviewing records during pre-charting, more than 50% of which was spent in counseling and coordination of care, reviewing test results, reviewing medications and treatment regimen and/or in discussing or reviewing the diagnosis of tremor, the prognosis and treatment options. Pertinent laboratory and imaging test results that were  available during this visit with the patient were reviewed by me and considered in my medical decision making (see chart for details).

## 2021-06-30 DIAGNOSIS — J029 Acute pharyngitis, unspecified: Secondary | ICD-10-CM | POA: Diagnosis not present

## 2021-06-30 DIAGNOSIS — B370 Candidal stomatitis: Secondary | ICD-10-CM | POA: Diagnosis not present

## 2021-07-04 DIAGNOSIS — R4189 Other symptoms and signs involving cognitive functions and awareness: Secondary | ICD-10-CM | POA: Diagnosis not present

## 2021-07-04 DIAGNOSIS — G250 Essential tremor: Secondary | ICD-10-CM | POA: Diagnosis not present

## 2021-07-04 DIAGNOSIS — F411 Generalized anxiety disorder: Secondary | ICD-10-CM | POA: Diagnosis not present

## 2021-07-09 ENCOUNTER — Telehealth: Payer: Self-pay

## 2021-07-09 NOTE — Telephone Encounter (Signed)
We received a new referral for this patient of Dr Tori Milks, previously seen in 2023 for tremors. The new referral is from Dr Zadie Rhine for memory concerns. The patient is requesting a provider switch from Dr Rexene Alberts to Dr Jaynee Eagles.Per the patient, her husband see's Dr Jaynee Eagles for dementia/

## 2021-07-09 NOTE — Telephone Encounter (Signed)
Okay with me 

## 2021-07-18 DIAGNOSIS — Z1231 Encounter for screening mammogram for malignant neoplasm of breast: Secondary | ICD-10-CM | POA: Diagnosis not present

## 2021-07-27 IMAGING — CT CT ABD-PELV W/ CM
1 of 3 series · 13 of 32 positions shown, 19 images · IV contrast (APPLIED)
Comparison: None.

CLINICAL DATA: Unintentional weight loss

EXAM:
CT ABDOMEN AND PELVIS WITH CONTRAST
TECHNIQUE: Multidetector CT imaging of the abdomen and pelvis was performed
using the standard protocol following bolus administration of
intravenous contrast.
CONTRAST:  100mL Y2XLQN-YSS IOPAMIDOL (Y2XLQN-YSS) INJECTION 61%

[Series 2: abd/pelvis w/cm · axial · 0.67mm/px · z∈[-426,-81]mm · 13 of 81 slices shown, 19 images]
[im 6/81  soft-tissue]
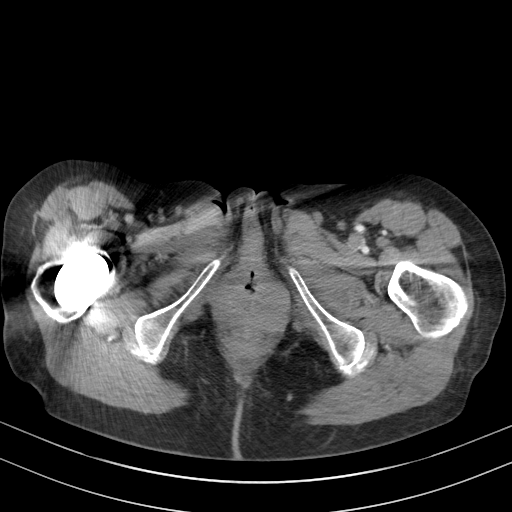
[im 6/81  bone]
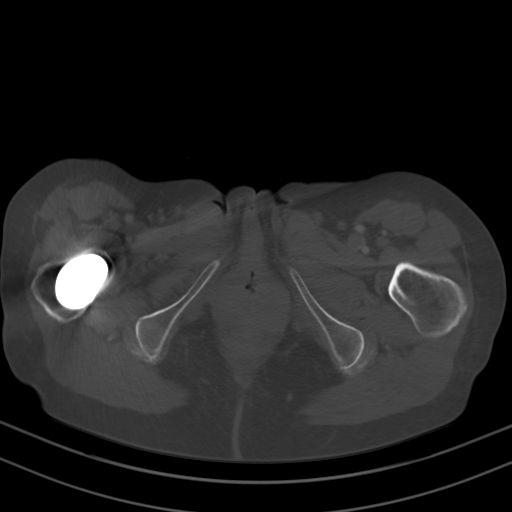
[im 11/81  soft-tissue]
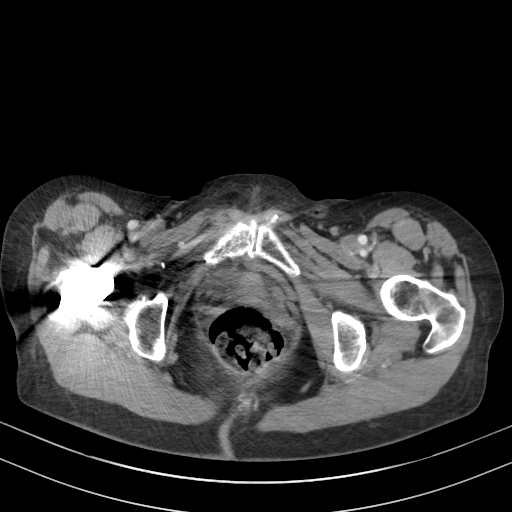
[im 17/81  soft-tissue]
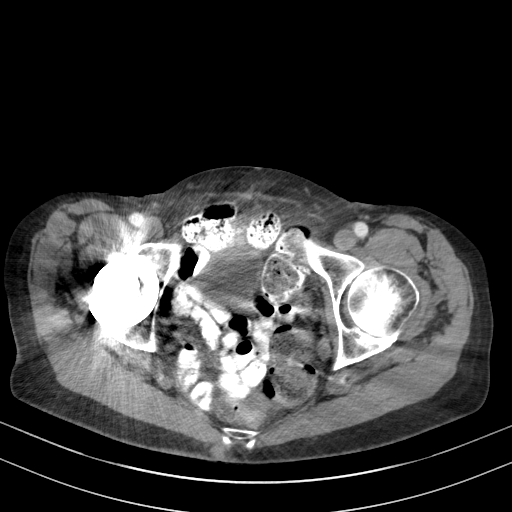
[im 22/81  soft-tissue]
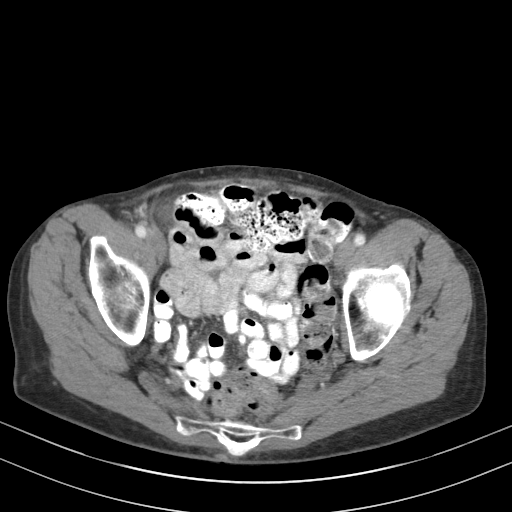
[im 27/81  soft-tissue]
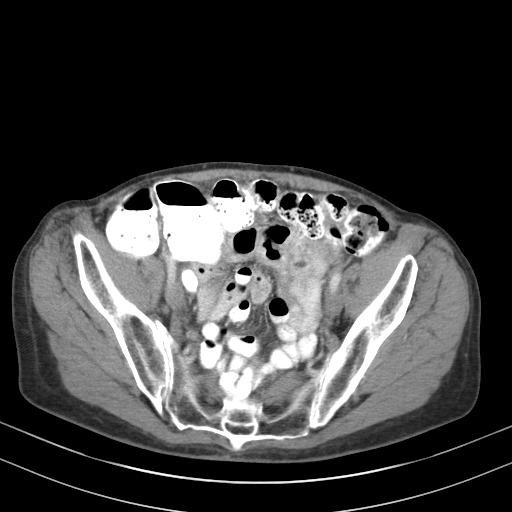
[im 33/81  soft-tissue]
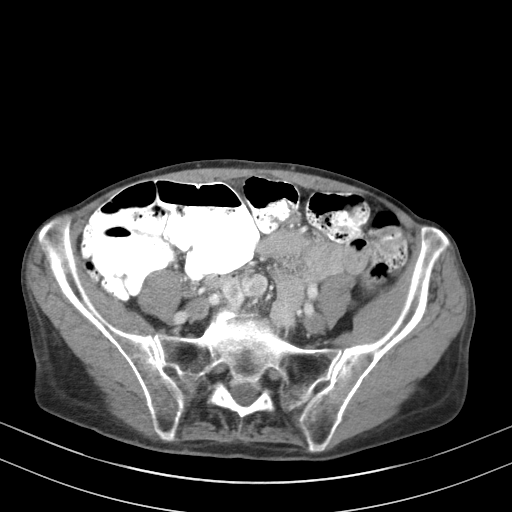
[im 43/81  soft-tissue]
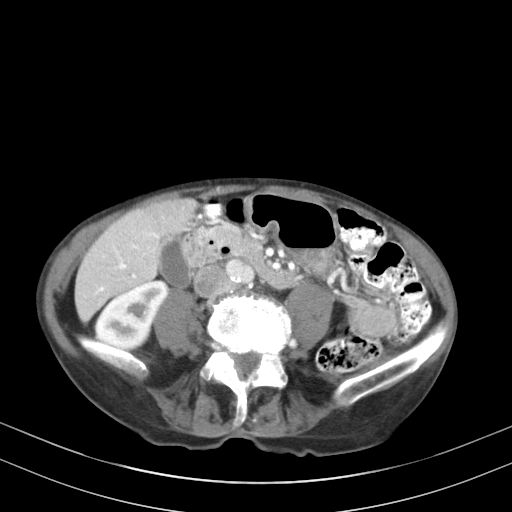
[im 49/81  soft-tissue]
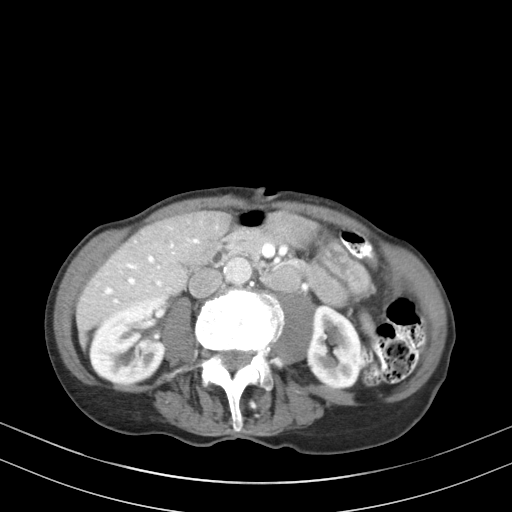
[im 54/81  soft-tissue]
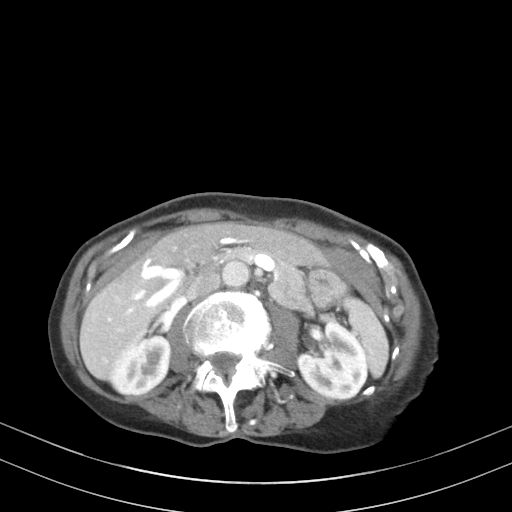
[im 54/81  bone]
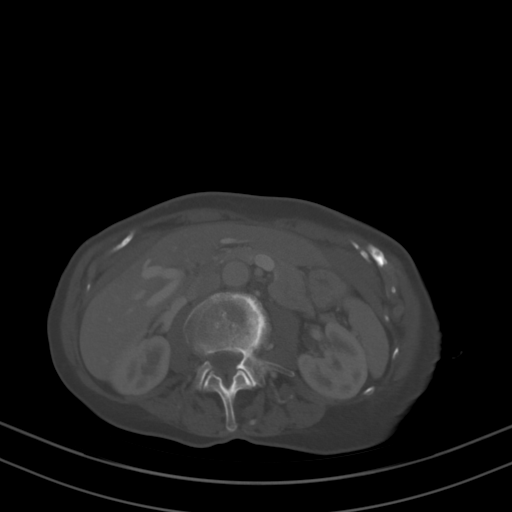
[im 59/81  soft-tissue]
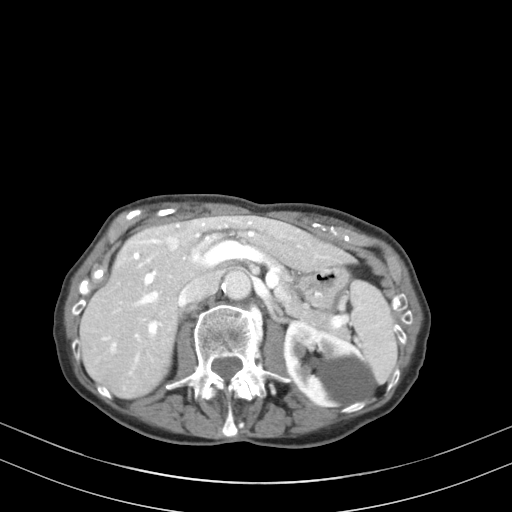
[im 59/81  lung]
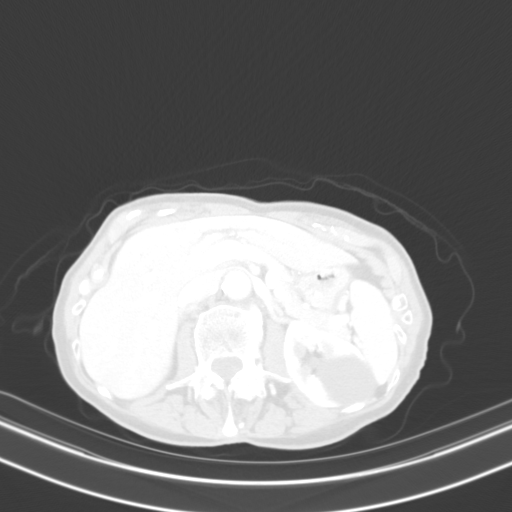
[im 65/81  soft-tissue]
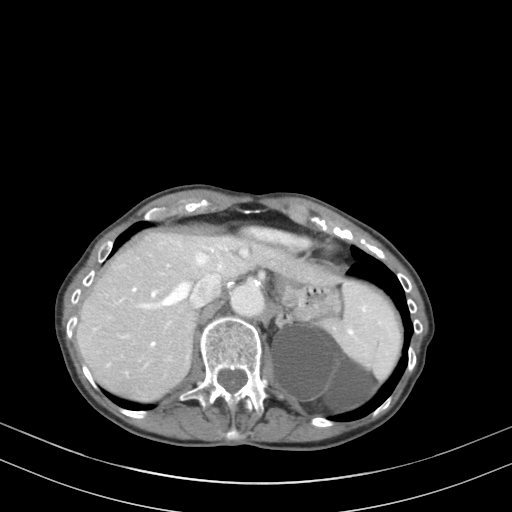
[im 65/81  lung]
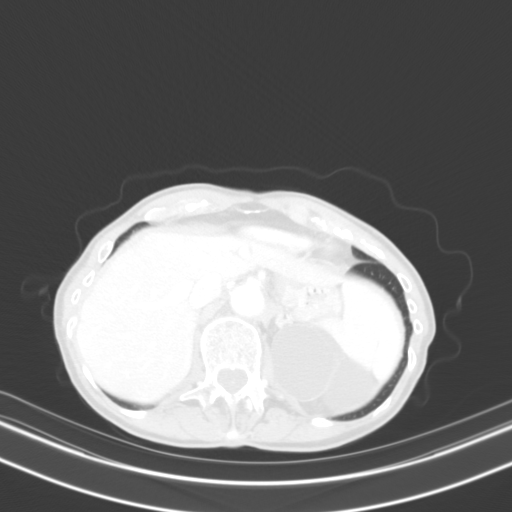
[im 70/81  soft-tissue]
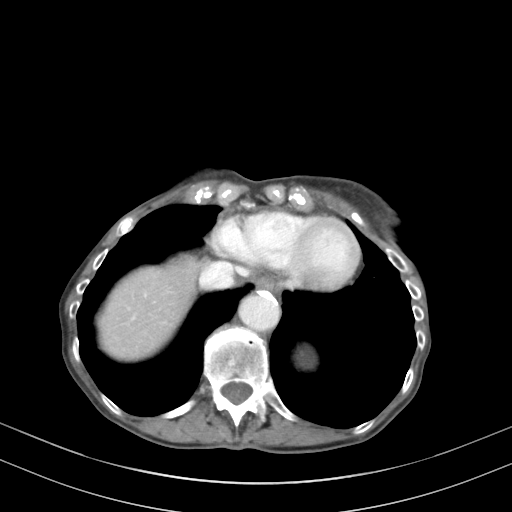
[im 70/81  lung]
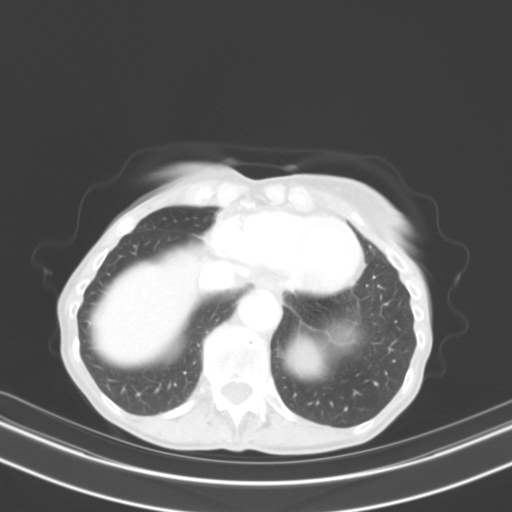
[im 75/81  soft-tissue]
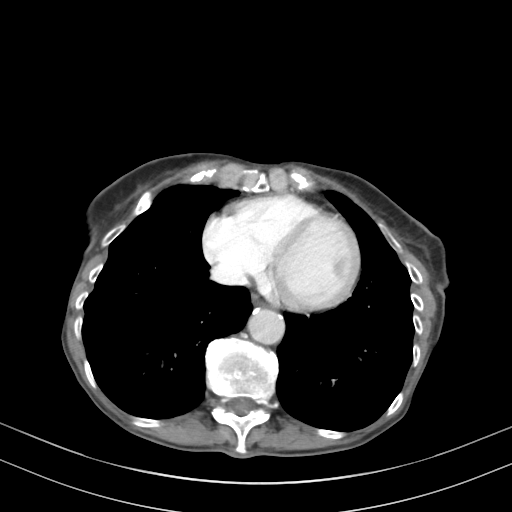
[im 75/81  lung]
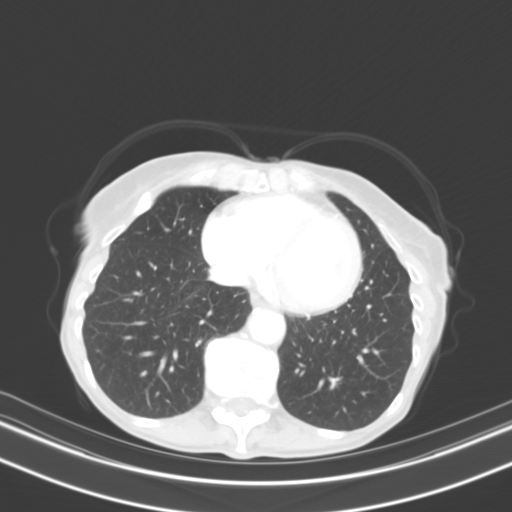

[13 of 32 positions shown; findings below may reference images not displayed]

FINDINGS: Lower chest: No confluent opacities or effusions.

Hepatobiliary: No focal hepatic abnormality. Gallbladder
unremarkable.

Pancreas: No focal abnormality or ductal dilatation.

Spleen: No focal abnormality.  Normal size.

Adrenals/Urinary Tract: 4.5 cm and 4.3 cm cysts in the left upper
pole. No stones or hydronephrosis. Adrenal glands and urinary
bladder are unremarkable.

Stomach/Bowel: Moderate stool throughout the colon. Stomach, large
and small bowel grossly unremarkable.

Vascular/Lymphatic: Scattered aortic atherosclerosis. No evidence of
aneurysm or adenopathy.

Reproductive: Prior hysterectomy.  No adnexal masses.

Other: No free fluid or free air.

Musculoskeletal: Prior right hip replacement. Degenerative changes
in the lumbar spine. No acute bony abnormality.
IMPRESSION: No acute findings in the abdomen or pelvis.

No explanation for the patient's weight loss.

Moderate stool burden throughout the colon.

## 2021-08-08 DIAGNOSIS — M25561 Pain in right knee: Secondary | ICD-10-CM | POA: Diagnosis not present

## 2021-08-08 DIAGNOSIS — M1711 Unilateral primary osteoarthritis, right knee: Secondary | ICD-10-CM | POA: Diagnosis not present

## 2021-08-22 ENCOUNTER — Encounter: Payer: Self-pay | Admitting: *Deleted

## 2021-08-23 ENCOUNTER — Ambulatory Visit (INDEPENDENT_AMBULATORY_CARE_PROVIDER_SITE_OTHER): Payer: Medicare Other | Admitting: Neurology

## 2021-08-23 ENCOUNTER — Other Ambulatory Visit: Payer: Self-pay

## 2021-08-23 ENCOUNTER — Telehealth: Payer: Self-pay | Admitting: Neurology

## 2021-08-23 ENCOUNTER — Encounter: Payer: Self-pay | Admitting: Neurology

## 2021-08-23 VITALS — BP 120/63 | HR 65 | Ht 66.0 in | Wt 106.8 lb

## 2021-08-23 DIAGNOSIS — F439 Reaction to severe stress, unspecified: Secondary | ICD-10-CM

## 2021-08-23 DIAGNOSIS — R4189 Other symptoms and signs involving cognitive functions and awareness: Secondary | ICD-10-CM | POA: Diagnosis not present

## 2021-08-23 DIAGNOSIS — E559 Vitamin D deficiency, unspecified: Secondary | ICD-10-CM | POA: Diagnosis not present

## 2021-08-23 DIAGNOSIS — E538 Deficiency of other specified B group vitamins: Secondary | ICD-10-CM | POA: Diagnosis not present

## 2021-08-23 DIAGNOSIS — F32A Depression, unspecified: Secondary | ICD-10-CM

## 2021-08-23 DIAGNOSIS — E519 Thiamine deficiency, unspecified: Secondary | ICD-10-CM | POA: Diagnosis not present

## 2021-08-23 DIAGNOSIS — F419 Anxiety disorder, unspecified: Secondary | ICD-10-CM | POA: Diagnosis not present

## 2021-08-23 DIAGNOSIS — R7983 Abnormal findings of blood amino-acid level: Secondary | ICD-10-CM

## 2021-08-23 DIAGNOSIS — R413 Other amnesia: Secondary | ICD-10-CM | POA: Diagnosis not present

## 2021-08-23 DIAGNOSIS — R630 Anorexia: Secondary | ICD-10-CM | POA: Diagnosis not present

## 2021-08-23 NOTE — Patient Instructions (Signed)
MRI of the brain ?Blood work ?Dr. Sima Matas ? ?Recommendations to prevent or slow progression of cognitive decline:  ? ?Exercise You should increase exercise 30 to 45 minutes per day at least 3 days a week although 5 to 7 would be preferred. Any type of exercise (including walking) is acceptable although a recumbent bicycle may be best if you are unsteady. Disease related apathy can be a significant roadblock to exercise and the only way to overcome this is to make it a daily routine and perhaps have a reward at the end (something your loved one loves to eat or drink perhaps) or a personal trainer coming to the home can also be very useful. In general a structured, repetitive schedule is best.  ? ?Cardiovascular Health: You should optimize all cardiovascular risk factors (blood pressure, sugar, cholesterol) as vascular disease such as strokes and heart attacks can make memory problems much worse.  ? ?Diet: Eating a heart healthy (Mediterranean) diet is also a good idea; fish and poultry instead of red meat, nuts (mostly non-peanuts), vegetables, fruits, olive oil or canola oil (instead of butter), minimal salt (use other spices to flavor foods), whole grain rice, bread, cereal and pasta and wine in moderation. ? ?General Health: Any diseases which effect your body will effect your brain such as a pneumonia, urinary infection, blood clot, heart attack or stroke. Keep contact with your primary care doctor for regular follow ups. ? ?Sleep. A good nights sleep is healthy for the brain. Seven hours is recommended. If you have insomnia or poor sleep habits see the recommendations below ? ?Tips: Structured and consistent daytime and nighttime routine, including regular wake times, bedtimes, and mealtimes, will be important for the patient to avoid confusion. Keeping frequently used items in designated places will help reduce stress from searching. If there are worries about getting lost do not let the patient leave home  unaccompanied. They might benefit from wearing an identification bracelet that will help others assist in finding home if they become lost. Information about nationwide safe return services and other helpful resources may be obtained through the Alzheimer?s Association helpline at 1800-505-686-5640. ? ?Finances, Power of Producer, television/film/video Directives: You should consider putting legal safeguards in place with regard to financial and medical decision making. While the spouse always has power of attorney for medical and financial issues in the absence of any form, you should consider what you want in case the spouse / caregiver is no longer around or capable of making decisions.  ? ?[place] : http://www.welch.com/.pdf ? ?Or Google "[facility]" AND "An Forensic scientist for [employer]" ? ?Other States: ApartmentMom.com.ee ? ?The signature on these forms should be notarized.  ? ?DRIVING:  ? ?Driving only during the day ?Drive only to familiar Locations ?Avoid driving during bad weather ? ?If you would like to be tested to see if you are driving safely, [facility] has a Clinical Driving Evaluation. To schedule an appointment call [phone].  ?            ? ? ?RESOURCES: ? ?Memory Loss: Improve your short term memory By Silvio Pate ? ?The Alzheimer's Reading Room http://www.alzheimersreadingroom.com/  ? ?The Alzheimer's Compendium http://www.alzcompend.info/ ? ?[place] www.dukefamilysupport.org 213-708-4908 ? ?Recommended resources for caregivers (All can be purchased on [employer]): ? ?1) A Caregiver's Guide to Dementia: Using Activities and Other Strategies to Prevent, Reduce and Manage Behavioral Symptoms by Osie Bond. Tyler Aas and [employer]  ? ?2) A Caregiver's Guide to [employer]  Dementia by Caleen Essex MS BSN and Gaston Islam  ? ?3)  What If It's Not Alzheimer's?: A Caregiver's Guide to Dementia by Koren Shiver (Author), Octaviano Batty (Editor) ? ?3) The 36 hour day by Rabins and Mace ? ?4) Understanding Difficult Behaviors by Merita Norton and White ? ?Online course for helping caregivers reduce stress, guilt and frustration called the Caregivers Helpbook. The website is www.powerfultoolsforcaregivers.org ? ?As a caregiver you are a Art gallery manager. Problems you face as a caregiver are usually unique to your situation and the way your loved-one's disease manifests itself. The best way to use these books is to look at the Table of Contents and read any chapters of interest or that apply to challenges you are having as a caregiver. ? ?NATIONAL RESOURCES: ?For more information on neurological disorders or research programs funded by the [employer] of Neurological Disorders and Stroke, contact the Institute?s Agricultural consultant (BRAIN) at: ?BRAIN ?[address] ?[place] [address] ?[phone] (toll-free) ?MasterBoxes.it  ?Information on dementia is also available from the following organizations: ?Alzheimer?s Disease Education and Referral ([place]) Center ?[employer] on Aging ?[address] ?[place] [address] ?[phone] (toll-free) ?DVDEnthusiasts.nl  ?Alzheimer?s Association ?[address] ?[place] [address] ?[phone] (toll-free, 24-hour helpline) ?[phone] (TDD) ?CapitalMile.co.nz  ?[facility] ?[address] ?[place] [address] ?[phone] (toll-free) ?www.alzfdn.org  ?[facility]?s [facility] ?[address] ?[place] [address] ?[phone]-([phone] ?www.alzdiscovery.org  ?Association for Frontotemporal Degeneration ?[address] ?[place] [address] ?[phone] (toll-free) ?www.theaftd.org  ?[facility] ?[address] ?[place] [address] ?[phone] (toll-free) ?www.brightfocus.org/alzheimers  ?[facility] St[facility] [place] ?[place] [address] ?[place] [address] ?[phone] ?TripBusiness.hu  ?Lewy Body Dementia Association ?[address], [place] [address] ?[phone] ?[phone] (toll-free LBD Caregiver Link) ?www.lbda.org  ?[facility] ?[place] [place] ?[place] [address] ?[phone] (toll-free) ?[phone] (TTY) ?https://carter.com/  ?National Organization for Rare Disorders ?[address] ?[place] [address] ?[phone]) (toll-free) ?www.rarediseases.org  ?The Dementias: Hope Through Research was jointly produced by the [employer] of Neurological Disorders and Stroke (NINDS) and the [employer] on Aging (NIA), both part of the [employer], the [employer] agency--supporting scientific studies that turn discovery into health. ?NINDS is the nation?s leading funder of research on the brain and nervous system. The NINDS mission is to reduce the burden of neurological disease. For more information and resources, visit MasterBoxes.it [1] or call (650)097-8563. ?NIA leads the federal government effort conducting and supporting research on aging and the health and well-being of older people. NIA?s Alzheimer?s Disease Education and Referral (ADEAR) Center offers information and publications on dementia and caregiving for families, caregivers, and professionals. For more information, visit DVDEnthusiasts.nl [2] or call (620)210-7286. ?Also available from NIA are publications and information about Alzheimer?s disease as well as the booklets Frontotemporal Disorders: Information for Patients, Families, and Caregivers and Lewy Body Dementia: Information for Patients, Families, and Professionals. ?Source URL:  SocialSpecialists.co.nz ? ?  ?

## 2021-08-23 NOTE — Progress Notes (Signed)
[facility]    Provider:  Dr Jaynee Eagles Requesting Provider: Radene Ou Bill Salinas, MD Primary Care Provider:  Aretta Nip, MD  CC:  Memory loss  HPI:  Marie Coleman is a 81 y.o. female here as requested by Rankins, Bill Salinas, MD for memory loss. PMHx arthritis, osteoporosis and arthritis, protein calorie malnutrition, anxiety, BPPV, fecl incontinence, essential tremor. She is here for memory evaluation. I reviewed Dr. Radene Ou' notes: Patient has been feeling depressed and anxious recently, was stressful to move into [facility] independent living recently, husband has Alzheimer's and her daughter has been diagnosed with cancer, husband lives with patient in new apartment, patient is a primary caregiver for her husband, she has been feeling stress when her plans for the day are changed or interrupted, they discussed anxiety medication, daughter accompanies patient and reported that she does not eat much, she is thin but her weight is stable, she was sent to [facility] neurologic for cognitive evaluation, she saw my colleague Dr. Rexene Alberts in the past for tremor, patient and daughter requested to me because I have seen patient's husband for Alzheimer's.  She is at [employer]. She is his main caregiver of her husband wit Alzheimer's. Husband is benefitting from [facility], independent living, they just moved in a few months ago, there was a lot of stress, it was a huge change.Moving was difficult for her. Here with son [name]. 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 Early on she would remember, but now her mind goes blank, both people she just met and people she has known. She still pays the bills but it is hard, because of the tremor writing slows her down, she has a financial person helping her [name], patient pays her own credit card and the club bills and is  independent. Not missing or double paying. She is taking her medication and helps her husband take his medication. She is still driving, not getting lost, still has her sense of direction. She doesn't repeat things in the same day, she uses a calendar and has good compensation skills, writing things down, keeping notes. Son provides information, there have been 1-2 occassions she has forgotten why she is going to an appointment very rarely but knew about the appointment. She has a lot of orthopaedic issues and lots of doctors to keep track of. Son says the move was very stressful and had a negative impact. Husband is a good Air cabin crew. She says people are nice and accommodating and sounds like it was a good move but it was overwhelming. Her problem solving is impaired a little bit, logistics on who is driving with who.Mother had dementia but she was 21, showing symptoms at the age of 47. She also had an Aunt on her father's side with dementia. Mother could not recognize people in the end.   Reviewed notes, labs and imaging from outside physicians, which showed:  CT head 2017: FINDINGS: reviewed image and agree CT HEAD FINDINGS   Negative for acute intracranial hemorrhage, acute infarction, mass, mass effect, hydrocephalus or midline shift. Gray-white differentiation is preserved throughout. No focal soft tissue or calvarial abnormality. Globes and orbits are symmetric and unremarkable bilaterally. Normal aeration of the mastoid air cells and visualized paranasal sinuses.  Review of Systems: Patient complains of symptoms per HPI as well as the following symptoms memory loss. Pertinent negatives and positives per  HPI. All others negative.   Social History   Socioeconomic History   Marital status: Married    Spouse name: Not on file   Number of children: 1   Years of education: Not on file   Highest education level: Not on file  Occupational History   Not on file  Tobacco Use   Smoking status:  Never   Smokeless tobacco: Never  Vaping Use   Vaping Use: Never used  Substance and Sexual Activity   Alcohol use: Yes    Comment: OCCAS mixed drink   Drug use: No   Sexual activity: Not on file  Other Topics Concern   Not on file  Social History Narrative   Caffeine Pepsi   Social Determinants of Health   Financial Resource Strain: Not on file  Food Insecurity: Not on file  Transportation Needs: Not on file  Physical Activity: Not on file  Stress: Not on file  Social Connections: Not on file  Intimate Partner Violence: Not on file    Family History  Problem Relation Age of Onset   Dementia Mother    Heart attack Father    Tremor Neg Hx     Past Medical History:  Diagnosis Date   Arthritis    OA AND PAIN LEFT KNEE AND PAIN IN SHOULDERS, HANDS, LOWER BACK   BPV (benign positional vertigo)    Osteoporosis    Seasonal allergies    Tremor    Vertigo     Patient Active Problem List   Diagnosis Date Noted   Cognitive decline 08/27/2021   Avascular necrosis of bone of hip ([place]) 04/20/2012   Postop Transfusion  09/26/2011   Postop Hyponatremia 09/26/2011   Postop Acute blood loss anemia 09/25/2011   OA (osteoarthritis) of knee 09/23/2011   Hip fracture, right ([place]) 05/14/2011    Past Surgical History:  Procedure Laterality Date   ABDOMINAL HYSTERECTOMY  06/18/1975   BACK SURGERY     CATARACT EXTRACTION, BILATERAL Bilateral 2019   HIP PINNING,CANNULATED  05/15/2011   Procedure: CANNULATED HIP PINNING;  Surgeon: Tobi Bastos;  Location: [facility];  Service: Orthopedics;  Laterality: Right;  Cannulated Right Hip Pinning   (c-arm)    LEFT ROTATOR CUFF REPAIR JAN 2012     LUMBAR SURGERY FOR HNP 1990'S     ORIF LEFT WRIST  1990'S     ROTATOR CUFF REPAIR Right 08/2012   SURGERY FOR NECK SPURS 1980'S     TOTAL HIP REVISION  04/20/2012   Procedure: TOTAL HIP REVISION;  Surgeon: Gearlean Alf, MD;  Location: [facility];  Service: Orthopedics;  Laterality: Right;   CONVERSION OF  PREVIOUS HIP SURGERY   TOTAL KNEE ARTHROPLASTY  09/23/2011   Procedure: TOTAL KNEE ARTHROPLASTY;  Surgeon: Gearlean Alf, MD;  Location: [facility];  Service: Orthopedics;  Laterality: Left;    Current Outpatient Medications  Medication Sig Dispense Refill   acetaminophen (TYLENOL) 500 MG tablet Take 500 mg by mouth daily as needed for moderate pain or headache. Reported on 06/25/2015     aspirin 81 MG chewable tablet Chew 81 mg by mouth daily.     atenolol (TENORMIN) 25 MG tablet Take 25 mg by mouth daily.     calcium carbonate (TUMS - DOSED IN MG ELEMENTAL CALCIUM) 500 MG chewable tablet Chew 1 tablet by mouth daily as needed for indigestion or heartburn.     Calcium-Vitamin A-Vitamin D (LIQUID CALCIUM PO) Take 15 mLs by mouth daily.  Multiple Vitamin (MULTIVITAMIN WITH MINERALS) TABS tablet Take 1 tablet by mouth daily.     sodium chloride (OCEAN) 0.65 % nasal spray Place 1 spray into the nose as needed. Allergies     No current facility-administered medications for this visit.    Allergies as of 08/23/2021 - Review Complete 08/23/2021  Allergen Reaction Noted   Sulfa antibiotics Hives 05/14/2011    Vitals: BP 120/63    Pulse 65    Ht 5' 6"  (1.676 m)    Wt 106 lb 12.8 oz (48.4 kg)    BMI 17.24 kg/m  Last Weight:  Wt Readings from Last 1 Encounters:  08/23/21 106 lb 12.8 oz (48.4 kg)   Last Height:   Ht Readings from Last 1 Encounters:  08/23/21 5' 6"  (1.676 m)     Physical exam: Exam: Gen: NAD, conversant, well nourised, well groomed                     CV: RRR, no MRG. No Carotid Bruits. No peripheral edema, warm, nontender Eyes: Conjunctivae clear without exudates or hemorrhage  Neuro: Detailed Neurologic Exam  Speech:    Speech is normal; fluent and spontaneous with normal comprehension.  Cognition:  MMSE - Mini Mental State Exam 08/23/2021  Orientation to time 5  Orientation to Place 5  Registration 3  Attention/ Calculation 2  Recall 2   Language- name 2 objects 2  Language- repeat 1  Language- follow 3 step command 3  Language- read & follow direction 1  Write a sentence 1  Copy design 0  Total score 25       The patient is oriented to person, place, and time;     recent and remote memory intact;     language fluent;     normal attention, concentration,     fund of knowledge Cranial Nerves:    The pupils are equal, round, and reactive to light. Attempted, pupils too small to visualize. Visual fields are full to finger confrontation. Extraocular movements are intact. Trigeminal sensation is intact and the muscles of mastication are normal. The face is symmetric. The palate elevates in the midline. Hearing intact. Voice is normal. Shoulder shrug is normal. The tongue has normal motion without fasciculations.   Coordination:    Normal finger to nose and heel to shin. Normal rapid alternating movements.   Gait:    Heel-toe and tandem gait are normal.   Motor Observation:    No asymmetry, no atrophy, and no involuntary movements noted. Tone:    Normal muscle tone.    Posture:    Posture is normal. normal erect    Strength:    Strength is V/V in the upper and lower limbs.      Sensation: intact to LT     Reflex Exam:  DTR's:    Deep tendon reflexes in the upper and lower extremities are symetrical  bilaterally.   Toes:    The toes are downgoing bilaterally.   Clonus:    Clonus is absent.    Assessment/Plan:  Lovely 81 y.o. female here as requested by Rankins, Bill Salinas, MD for memory loss. PMHx arthritis, osteoporosis and arthritis, protein calorie malnutrition, anxiety, BPPV, fecl incontinence, essential tremor. She is here for memory evaluation.  She is under a tremendous amount of stress, just moved to [place] facility, her husband has Alzheimer's and she is his primary caregiver. Will send to formal neuropsychiatric testing to evaluate for dementia vs pseudodementia due to  anxiety, depression,  stress.  MRI of the brain for reversible causes of dementia  Blood work today  Will have her return to clinic when work up is complete  Encouraged her to seek therapy and psychiatry for her depression and stress  Orders Placed This Encounter  Procedures   MR BRAIN W WO CONTRAST   CBC with Differential/Platelets   Comprehensive metabolic panel   N53 and Folate Panel   Methylmalonic acid, serum   Vitamin B1   Vitamin D, 25-hydroxy   TSH   T4, Free   Ambulatory referral to Neuropsychology   No orders of the defined types were placed in this encounter.   Cc: Rankins, Bill Salinas, MD,  Rankins, Bill Salinas, MD  Sarina Ill, MD  [facility] [address] [place], [place] [address]  Phone [phone] Fax [phone]  I spent 45 minutes of face-to-face and non-face-to-face time with patient on the  1. Cognitive decline   2. Anxiety   3. Stress   4. Decreased appetite   5. screen for B12 deficiency   6. Screen for Vitamin B1 deficiency   7. Screen for Vitamin D deficiency   8. Depression, unspecified depression type   9. Screen for Homocysteinemia   10. Short-term memory loss   11. Impaired problem solving    diagnosis.  This included previsit chart review, lab review, study review, order entry, electronic health record documentation, patient education on the different diagnostic and therapeutic options, counseling and coordination of care, risks and benefits of management, compliance, or risk factor reduction

## 2021-08-23 NOTE — Telephone Encounter (Signed)
Medicare/aetna order sent to [facility], they will obtain the auth for aetna and reach out to the patient to schedule.  °

## 2021-08-27 ENCOUNTER — Encounter: Payer: Self-pay | Admitting: Neurology

## 2021-08-27 DIAGNOSIS — R4189 Other symptoms and signs involving cognitive functions and awareness: Secondary | ICD-10-CM | POA: Insufficient documentation

## 2021-08-27 LAB — COMPREHENSIVE METABOLIC PANEL
ALT: 28 IU/L (ref 0–32)
AST: 30 IU/L (ref 0–40)
Albumin/Globulin Ratio: 1.3 (ref 1.2–2.2)
Albumin: 4 g/dL (ref 3.7–4.7)
Alkaline Phosphatase: 48 IU/L (ref 44–121)
BUN/Creatinine Ratio: 38 — ABNORMAL HIGH (ref 12–28)
BUN: 26 mg/dL (ref 8–27)
Bilirubin Total: 0.5 mg/dL (ref 0.0–1.2)
CO2: 27 mmol/L (ref 20–29)
Calcium: 9.5 mg/dL (ref 8.7–10.3)
Chloride: 101 mmol/L (ref 96–106)
Creatinine, Ser: 0.69 mg/dL (ref 0.57–1.00)
Globulin, Total: 3.1 g/dL (ref 1.5–4.5)
Glucose: 91 mg/dL (ref 70–99)
Potassium: 4.5 mmol/L (ref 3.5–5.2)
Sodium: 140 mmol/L (ref 134–144)
Total Protein: 7.1 g/dL (ref 6.0–8.5)
eGFR: 88 mL/min/{1.73_m2} (ref >59)

## 2021-08-27 LAB — CBC WITH DIFFERENTIAL/PLATELET
Basophils Absolute: 0 10*3/uL (ref 0.0–0.2)
Basos: 1 %
EOS (ABSOLUTE): 0.1 10*3/uL (ref 0.0–0.4)
Eos: 1 %
Hematocrit: 42.8 % (ref 34.0–46.6)
Hemoglobin: 14.2 g/dL (ref 11.1–15.9)
Immature Grans (Abs): 0 10*3/uL (ref 0.0–0.1)
Immature Granulocytes: 0 %
Lymphocytes Absolute: 1.5 10*3/uL (ref 0.7–3.1)
Lymphs: 22 %
MCH: 30 pg (ref 26.6–33.0)
MCHC: 33.2 g/dL (ref 31.5–35.7)
MCV: 90 fL (ref 79–97)
Monocytes Absolute: 0.6 10*3/uL (ref 0.1–0.9)
Monocytes: 8 %
Neutrophils Absolute: 4.7 10*3/uL (ref 1.4–7.0)
Neutrophils: 68 %
Platelets: 252 10*3/uL (ref 150–450)
RBC: 4.74 x10E6/uL (ref 3.77–5.28)
RDW: 11.8 % (ref 11.7–15.4)
WBC: 6.8 10*3/uL (ref 3.4–10.8)

## 2021-08-27 LAB — T4, FREE: Free T4: 1.16 ng/dL (ref 0.82–1.77)

## 2021-08-27 LAB — B12 AND FOLATE PANEL
Folate: >20 ng/mL (ref >3.0)
Vitamin B-12: 489 pg/mL (ref 232–1245)

## 2021-08-27 LAB — VITAMIN B1: Thiamine: 120.7 nmol/L (ref 66.5–200.0)

## 2021-08-27 LAB — VITAMIN D 25 HYDROXY (VIT D DEFICIENCY, FRACTURES): Vit D, 25-Hydroxy: 37.8 ng/mL (ref 30.0–100.0)

## 2021-08-27 LAB — TSH: TSH: 0.913 u[IU]/mL (ref 0.450–4.500)

## 2021-08-27 LAB — METHYLMALONIC ACID, SERUM: Methylmalonic Acid: 126 nmol/L (ref 0–378)

## 2021-09-18 ENCOUNTER — Ambulatory Visit
Admission: RE | Admit: 2021-09-18 | Discharge: 2021-09-18 | Disposition: A | Discharge status: Home / Self Care | Payer: Medicare Other | Source: Ambulatory Visit | Attending: Neurology | Admitting: Neurology

## 2021-09-18 DIAGNOSIS — F439 Reaction to severe stress, unspecified: Secondary | ICD-10-CM

## 2021-09-18 DIAGNOSIS — R630 Anorexia: Secondary | ICD-10-CM

## 2021-09-18 DIAGNOSIS — R413 Other amnesia: Secondary | ICD-10-CM | POA: Diagnosis not present

## 2021-09-18 DIAGNOSIS — F419 Anxiety disorder, unspecified: Secondary | ICD-10-CM

## 2021-09-18 DIAGNOSIS — R4189 Other symptoms and signs involving cognitive functions and awareness: Secondary | ICD-10-CM

## 2021-09-18 MED ORDER — GADOBENATE DIMEGLUMINE 529 MG/ML IV SOLN
10.0000 mL | Freq: Once | INTRAVENOUS | Status: AC | PRN
  Administered 2021-09-18: 10 mL via INTRAVENOUS

## 2021-10-02 DIAGNOSIS — M6530 Trigger finger, unspecified finger: Secondary | ICD-10-CM | POA: Diagnosis not present

## 2021-10-02 DIAGNOSIS — M1990 Unspecified osteoarthritis, unspecified site: Secondary | ICD-10-CM | POA: Diagnosis not present

## 2021-10-02 DIAGNOSIS — F320 Major depressive disorder, single episode, mild: Secondary | ICD-10-CM | POA: Diagnosis not present

## 2021-10-02 DIAGNOSIS — R5381 Other malaise: Secondary | ICD-10-CM | POA: Diagnosis not present

## 2021-10-02 DIAGNOSIS — G250 Essential tremor: Secondary | ICD-10-CM | POA: Diagnosis not present

## 2021-10-10 DIAGNOSIS — Z9181 History of falling: Secondary | ICD-10-CM | POA: Diagnosis not present

## 2021-10-10 DIAGNOSIS — R5381 Other malaise: Secondary | ICD-10-CM | POA: Diagnosis not present

## 2021-10-10 DIAGNOSIS — R2681 Unsteadiness on feet: Secondary | ICD-10-CM | POA: Diagnosis not present

## 2021-10-10 DIAGNOSIS — M6281 Muscle weakness (generalized): Secondary | ICD-10-CM | POA: Diagnosis not present

## 2021-10-10 DIAGNOSIS — R2689 Other abnormalities of gait and mobility: Secondary | ICD-10-CM | POA: Diagnosis not present

## 2021-10-15 DIAGNOSIS — R2689 Other abnormalities of gait and mobility: Secondary | ICD-10-CM | POA: Diagnosis not present

## 2021-10-15 DIAGNOSIS — Z9181 History of falling: Secondary | ICD-10-CM | POA: Diagnosis not present

## 2021-10-15 DIAGNOSIS — R5381 Other malaise: Secondary | ICD-10-CM | POA: Diagnosis not present

## 2021-10-15 DIAGNOSIS — R2681 Unsteadiness on feet: Secondary | ICD-10-CM | POA: Diagnosis not present

## 2021-10-15 DIAGNOSIS — M6281 Muscle weakness (generalized): Secondary | ICD-10-CM | POA: Diagnosis not present

## 2021-10-18 DIAGNOSIS — R2689 Other abnormalities of gait and mobility: Secondary | ICD-10-CM | POA: Diagnosis not present

## 2021-10-18 DIAGNOSIS — M6281 Muscle weakness (generalized): Secondary | ICD-10-CM | POA: Diagnosis not present

## 2021-10-18 DIAGNOSIS — R5381 Other malaise: Secondary | ICD-10-CM | POA: Diagnosis not present

## 2021-10-18 DIAGNOSIS — R2681 Unsteadiness on feet: Secondary | ICD-10-CM | POA: Diagnosis not present

## 2021-10-18 DIAGNOSIS — Z9181 History of falling: Secondary | ICD-10-CM | POA: Diagnosis not present

## 2021-10-22 DIAGNOSIS — Z9181 History of falling: Secondary | ICD-10-CM | POA: Diagnosis not present

## 2021-10-22 DIAGNOSIS — R2681 Unsteadiness on feet: Secondary | ICD-10-CM | POA: Diagnosis not present

## 2021-10-22 DIAGNOSIS — R5381 Other malaise: Secondary | ICD-10-CM | POA: Diagnosis not present

## 2021-10-22 DIAGNOSIS — R2689 Other abnormalities of gait and mobility: Secondary | ICD-10-CM | POA: Diagnosis not present

## 2021-10-22 DIAGNOSIS — M6281 Muscle weakness (generalized): Secondary | ICD-10-CM | POA: Diagnosis not present

## 2021-10-24 DIAGNOSIS — M6281 Muscle weakness (generalized): Secondary | ICD-10-CM | POA: Diagnosis not present

## 2021-10-24 DIAGNOSIS — Z9181 History of falling: Secondary | ICD-10-CM | POA: Diagnosis not present

## 2021-10-24 DIAGNOSIS — R2681 Unsteadiness on feet: Secondary | ICD-10-CM | POA: Diagnosis not present

## 2021-10-24 DIAGNOSIS — R2689 Other abnormalities of gait and mobility: Secondary | ICD-10-CM | POA: Diagnosis not present

## 2021-10-24 DIAGNOSIS — R5381 Other malaise: Secondary | ICD-10-CM | POA: Diagnosis not present

## 2021-10-30 DIAGNOSIS — R2681 Unsteadiness on feet: Secondary | ICD-10-CM | POA: Diagnosis not present

## 2021-10-30 DIAGNOSIS — M6281 Muscle weakness (generalized): Secondary | ICD-10-CM | POA: Diagnosis not present

## 2021-10-30 DIAGNOSIS — R2689 Other abnormalities of gait and mobility: Secondary | ICD-10-CM | POA: Diagnosis not present

## 2021-10-30 DIAGNOSIS — R5381 Other malaise: Secondary | ICD-10-CM | POA: Diagnosis not present

## 2021-10-30 DIAGNOSIS — Z9181 History of falling: Secondary | ICD-10-CM | POA: Diagnosis not present

## 2021-11-01 DIAGNOSIS — Z9181 History of falling: Secondary | ICD-10-CM | POA: Diagnosis not present

## 2021-11-01 DIAGNOSIS — R2689 Other abnormalities of gait and mobility: Secondary | ICD-10-CM | POA: Diagnosis not present

## 2021-11-01 DIAGNOSIS — R2681 Unsteadiness on feet: Secondary | ICD-10-CM | POA: Diagnosis not present

## 2021-11-01 DIAGNOSIS — R5381 Other malaise: Secondary | ICD-10-CM | POA: Diagnosis not present

## 2021-11-01 DIAGNOSIS — M6281 Muscle weakness (generalized): Secondary | ICD-10-CM | POA: Diagnosis not present

## 2021-11-06 DIAGNOSIS — R2689 Other abnormalities of gait and mobility: Secondary | ICD-10-CM | POA: Diagnosis not present

## 2021-11-06 DIAGNOSIS — R5381 Other malaise: Secondary | ICD-10-CM | POA: Diagnosis not present

## 2021-11-06 DIAGNOSIS — R2681 Unsteadiness on feet: Secondary | ICD-10-CM | POA: Diagnosis not present

## 2021-11-06 DIAGNOSIS — Z9181 History of falling: Secondary | ICD-10-CM | POA: Diagnosis not present

## 2021-11-06 DIAGNOSIS — M6281 Muscle weakness (generalized): Secondary | ICD-10-CM | POA: Diagnosis not present

## 2021-11-08 DIAGNOSIS — R2689 Other abnormalities of gait and mobility: Secondary | ICD-10-CM | POA: Diagnosis not present

## 2021-11-08 DIAGNOSIS — M6281 Muscle weakness (generalized): Secondary | ICD-10-CM | POA: Diagnosis not present

## 2021-11-08 DIAGNOSIS — R2681 Unsteadiness on feet: Secondary | ICD-10-CM | POA: Diagnosis not present

## 2021-11-08 DIAGNOSIS — R5381 Other malaise: Secondary | ICD-10-CM | POA: Diagnosis not present

## 2021-11-08 DIAGNOSIS — Z9181 History of falling: Secondary | ICD-10-CM | POA: Diagnosis not present

## 2021-11-13 DIAGNOSIS — R2689 Other abnormalities of gait and mobility: Secondary | ICD-10-CM | POA: Diagnosis not present

## 2021-11-13 DIAGNOSIS — R5381 Other malaise: Secondary | ICD-10-CM | POA: Diagnosis not present

## 2021-11-13 DIAGNOSIS — Z9181 History of falling: Secondary | ICD-10-CM | POA: Diagnosis not present

## 2021-11-13 DIAGNOSIS — R2681 Unsteadiness on feet: Secondary | ICD-10-CM | POA: Diagnosis not present

## 2021-11-13 DIAGNOSIS — M6281 Muscle weakness (generalized): Secondary | ICD-10-CM | POA: Diagnosis not present

## 2021-11-15 DIAGNOSIS — R5381 Other malaise: Secondary | ICD-10-CM | POA: Diagnosis not present

## 2021-11-15 DIAGNOSIS — R2681 Unsteadiness on feet: Secondary | ICD-10-CM | POA: Diagnosis not present

## 2021-11-15 DIAGNOSIS — R2689 Other abnormalities of gait and mobility: Secondary | ICD-10-CM | POA: Diagnosis not present

## 2021-11-15 DIAGNOSIS — M6281 Muscle weakness (generalized): Secondary | ICD-10-CM | POA: Diagnosis not present

## 2021-11-15 DIAGNOSIS — Z9181 History of falling: Secondary | ICD-10-CM | POA: Diagnosis not present

## 2021-11-28 DIAGNOSIS — R2689 Other abnormalities of gait and mobility: Secondary | ICD-10-CM | POA: Diagnosis not present

## 2021-11-28 DIAGNOSIS — Z9181 History of falling: Secondary | ICD-10-CM | POA: Diagnosis not present

## 2021-11-28 DIAGNOSIS — R2681 Unsteadiness on feet: Secondary | ICD-10-CM | POA: Diagnosis not present

## 2021-11-28 DIAGNOSIS — M6281 Muscle weakness (generalized): Secondary | ICD-10-CM | POA: Diagnosis not present

## 2021-11-28 DIAGNOSIS — R5381 Other malaise: Secondary | ICD-10-CM | POA: Diagnosis not present

## 2021-12-04 ENCOUNTER — Encounter: Payer: Medicare Other | Attending: Psychology | Admitting: Psychology

## 2021-12-04 DIAGNOSIS — R4189 Other symptoms and signs involving cognitive functions and awareness: Secondary | ICD-10-CM | POA: Insufficient documentation

## 2021-12-04 DIAGNOSIS — R413 Other amnesia: Secondary | ICD-10-CM | POA: Diagnosis not present

## 2021-12-25 ENCOUNTER — Encounter: Payer: Medicare Other | Attending: Psychology

## 2021-12-25 DIAGNOSIS — R413 Other amnesia: Secondary | ICD-10-CM | POA: Diagnosis not present

## 2021-12-25 DIAGNOSIS — R4189 Other symptoms and signs involving cognitive functions and awareness: Secondary | ICD-10-CM | POA: Diagnosis not present

## 2021-12-25 NOTE — Progress Notes (Signed)
Behavioral Observations  The patient appeared well-groomed and appropriately dressed. Her manners were polite and appropriate to the situation. The patient had a slight tremor but was able to complete sub-tests requiring motor skills without difficulty. The patient demonstrated a positive attitude toward testing and showed good effort, although she did express discouragement with her performance at the conclusion of the test.  Neuropsychology Note  KIMBERLI WINNE completed 120 minutes of neuropsychological testing with technician, Dina Rich, BA, under the supervision of Ilean Skill, PsyD., Clinical Neuropsychologist. The patient did not appear overtly distressed by the testing session, per behavioral observation or via self-report to the technician. Rest breaks were offered.   Clinical Decision Making: In considering the patient's current level of functioning, level of presumed impairment, nature of symptoms, emotional and behavioral responses during clinical interview, level of literacy, and observed level of motivation/effort, a battery of tests was selected by Dr. Sima Matas during initial consultation on 12/04/2021. This was communicated to the technician. Communication between the neuropsychologist and technician was ongoing throughout the testing session and changes were made as deemed necessary based on patient performance on testing, technician observations and additional pertinent factors such as those listed above.  Tests Administered: Controlled Oral Word Association Test (COWAT; [employer])  Wechsler Adult Intelligence Scale, 4th Edition (WAIS-IV) Wechsler Memory Scale, 4th Edition (WMS-IV); Older Adult Battery    Results:  COWAT      WAIS-IV  Composite Score Summary  Scale Sum of Scaled Scores Composite Score Percentile Rank 95% Conf. Interval Qualitative Description  Verbal Comprehension 26 VCI 93 32 88-99 Average  Perceptual Reasoning 24 PRI 88 21 82-95 Low  Average  Working Memory 17 WMI 92 30 86-99 Average  Processing Speed 24 PSI 111 77 102-118 High Average  Full Scale 91 FSIQ 94 34 90-98 Average  General Ability 50 GAI 89 23 84-94 Low Average      Verbal Comprehension Subtests Summary  Subtest Raw Score Scaled Score Percentile Rank Reference Group Scaled Score SEM  Similarities '18 8 25 6 '$ 0.90  Vocabulary 34 10 50 10 0.73  Information '8 8 25 7 '$ 0.73  (Comprehension) 23 11 63 10 1.08       Perceptual Reasoning Subtests Summary  Subtest Raw Score Scaled Score Percentile Rank Reference Group Scaled Score SEM  Block Design '20 8 25 5 '$ 1.34  Matrix Reasoning '6 7 16 3 '$ 1.12  Visual Puzzles 8 9 37 6 1.27  (Picture Completion) '6 8 25 5 '$ 1.24       Working Doctor, general practice Raw Score Scaled Score Percentile Rank Reference Group Scaled Score SEM  Digit Span '18 8 25 5 '$ 0.85  Arithmetic 11 9 37 8 0.99       Processing Speed Subtests Summary  Subtest Raw Score Scaled Score Percentile Rank Reference Group Scaled Score SEM  Symbol Search 23 12 75 6 1.12  Coding 48 12 75 6 1.12        WMS-IV  Index Score Summary  Index Sum of Scaled Scores Index Score Percentile Rank 95% Confidence Interval Qualitative Descriptor  Auditory Memory (AMI) 32 88 21 82-95 Low Average  Visual Memory (VMI) 15 87 19 82-92 Low Average  Immediate Memory (IMI) 26 91 27 85-98 Average  Delayed Memory (DMI) 21 81 10 75-90 Low Average      Primary Subtest Scaled Score Summary  Subtest Domain Raw Score Scaled Score Percentile Rank  Logical Memory I AM '15 6 9  '$ Logical Memory  II AM 12 9 37  Verbal [employer] I AM '13 8 25  '$ Verbal [facility] II AM 4 9 37  Visual Reproduction I VM 30 12 75  Visual Reproduction II VM 0 3 1  Symbol Span VWM 15 11 63      Auditory Memory Process Score Summary  Process Score Raw Score Scaled Score Percentile Rank Cumulative Percentage (Base Rate)  LM II Recognition 17 - - 26-50%  VPA II  Recognition 24 - - 17-25%       Visual Memory Process Score Summary  Process Score Raw Score Scaled Score Percentile Rank Cumulative Percentage (Base Rate)  VR II Recognition 5 - - >75%      ABILITY-MEMORY ANALYSIS  Ability Score:  VCI: 93 Date of Testing:  WAIS-IV; WMS-IV 2021/12/25  Predicted Difference Method   Index Predicted WMS-IV Index Score Actual WMS-IV Index Score Difference Critical Value  Significant Difference Y/N Base Rate  Auditory Memory 96 88 8 9.14 N   Visual Memory 97 87 10 7.30 Y 25%  Immediate Memory 96 91 5 10.12 N   Delayed Memory 96 81 15 10.62 Y 10-15%  Statistical significance (critical value) at the .01 level.        Feedback to Patient: LAKEIA BRADSHAW will return on 06/24/2022 for an interactive feedback session with Dr. Sima Matas at which time her test performances, clinical impressions and treatment recommendations will be reviewed in detail. The patient understands she can contact our office should she require our assistance before this time.  120 minutes spent face-to-face with patient administering standardized tests, 30 minutes spent scoring Environmental education officer). [CPT [id number], [id number]]  Full report to follow.

## 2022-01-01 ENCOUNTER — Encounter: Payer: Self-pay | Admitting: Psychology

## 2022-01-01 NOTE — Progress Notes (Signed)
Neuropsychological Consultation   Patient:   Marie Coleman   DOB:   08/22/1940  MR Number:  [id number]  Location:  [facility] [place], [place] [id number] [facility] [place] [address] Dept: [phone]           Date of Service:   12/04/2021  Start Time:   3 PM End Time:   5 PM  Today's visit was an in person visit that was conducted in my outpatient clinic office.  The patient, her daughter and myself were present for this clinical interview.  1 hour and 15 minutes was spent in face-to-face clinical interview and the other 45 minutes was spent with record review, report writing and setting up testing protocols.  Provider/Observer:  Ilean Skill, Psy.D.       Clinical Neuropsychologist       Billing Code/Service: 96116/96121  Chief Complaint:    Marie Coleman is an 81 year old female referred for neuropsychological evaluation as part of her larger neurological work-up.  The patient was referred by Sarina Ill, MD.  She was initially requested for neurological work-up by her PCP Milagros Evener, MD due to memory loss.  The patient has a past medical history including arthritis, osteoporosis, protein calorie malnutrition, anxiety, BPPV, fecal incontinence and essential tremor.  Patient has had increased symptoms of anxiety and depression with a recent stressful move into an independent living setting.  Patient's husband has Alzheimer's and the patient's daughter was recently diagnosed with cancer.  Patient's husband lives with the patient in a new apartment.  Patient has been the primary caregiver for her husband.  Reason for Service:  MESHAWN Coleman is an 81 year old female referred for neuropsychological evaluation as part of her larger neurological work-up.  The patient was referred by Sarina Ill, MD.  She was initially requested for neurological work-up by her PCP Milagros Evener,  MD due to memory loss.  The patient has a past medical history including arthritis, osteoporosis, protein calorie malnutrition, anxiety, BPPV, fecal incontinence and essential tremor.  Patient has had increased symptoms of anxiety and depression with a recent stressful move into an independent living setting.  Patient's husband has Alzheimer's and the patient's daughter was recently diagnosed with cancer.  Patient's husband lives with the patient in a new apartment.  Patient has been the primary caregiver for her husband.  The patient reports that her memory difficulties tend to come and go and sometimes they are better than others.  The patient reports that she is having more trouble recalling people's names but they often come back later.  The patient denies any geographic disorientation or visual spatial changes.  She also denies any history of hallucinations.  The patient has been diagnosed with essential tremor in her father was also diagnosed with essential tremor.  The patient has an aunt who developed significant cognitive difficulties around age 19.  The patient is described by her daughter is needing everything right and that the patient has a tendency to take on "too much."  Along with her memory changes the patient also has had more balance difficulties, hearing loss and vertigo along with her issues related to arthritis which is impacting her ability to write with her right hand.  The patient's daughter reports that the patient will need to focus on 1 thing at a time whereas the patient historically had been very good at multitasking.  She almost has to focus on 1  thing per day.  Both the patient and her daughter report that there is more difficulty with verbal memory and keeping up with schedules.  Patient is described as having more difficulty keeping steps in order to complete task and figuring out how to complete task with increased decision-making difficulties.  The patient is also having more  falls and did have a fall down some steps recently.  The patient worked as a Cabin crew for 40 years and never had difficulties with remembering names and being very effective in her cognitive functioning.  She is now described as her mind going "blank."  The patient is still paying bills but is having more difficulty keeping up with them.  The patient has not been missing bill payments or double pain.  The patient keeps up with her own medications and helps keep up with her husband's medication.  She is still driving and has not been getting lost or having geographic disorientation.  She denies any change in her sense of direction.  Patient utilizes calendar assist and writing things down to keep up with appointments.  Patient does have a lot of orthopedic issues and pain.  The patient's daughter reports that most of the symptoms were present prior to her moved to assisted living.  Patient's daughter reports that she first began noticing difficulties with the patient around the time the COVID pandemic started associated with significant loss in social interactions.  The patient's daughter was diagnosed with cancer which was also stressful to the patient and the patient's husband's diagnosis of Alzheimer's and declining cognitive functioning are also very stressful.  The patient reports that if she is tired she will fall asleep after going to bed around 10 PM.  She reports that if she is thinking about something or stress that she may have more difficulty following a sleep or wake up in the middle of the night "thinking" and not able to return to sleep.  She describes a good appetite.  The patient had an MRI brain conducted on 09/18/2021 without any particular indications of acute process.  The only notable issue on the MRI was mild periventricular and subcortical foci nonspecific T2 hyperintensities that are likely simply age-related changes.  Behavioral Observation: LAVONYA HOERNER  presents as a 81 y.o.-year-old  Right Caucasian Female who appeared her stated age. her dress was Appropriate and she was Well Groomed and her manners were Appropriate to the situation.  her participation was indicative of Appropriate and Redirectable behaviors.  There were physical disabilities noted.  she displayed an appropriate level of cooperation and motivation.     Interactions:    Active Appropriate  Attention:   abnormal and attention span appeared shorter than expected for age  Memory:   within normal limits; although there were some difficulties coming up with names such as where she was currently living and other retrieval type memory issues.  Visuo-spatial:  within normal limits  Speech (Volume):  normal  Speech:   normal; some word finding and naming difficulties.  Thought Process:  Coherent and Relevant  Though Content:  WNL; not suicidal and not homicidal  Orientation:   person, place, time/date, and situation  Judgment:   Good  Planning:   Fair  Affect:    Appropriate  Mood:    Euthymic  Insight:   Good  Intelligence:   high  Marital Status/Living: The patient was born and raised in [place] along with 2 siblings.  She currently lives with her husband in  a assisted living facility and the husband has been diagnosed with Alzheimer's.  They have been married for 62 years.  She is the primary caregiver with her husband.  They have 2 adult children age 10 and 19.  Current Employment: The patient is retired.  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  Hobbies and interest have included yardwork, flowers, reading, walking, and engagement in sports activities including both playing and watching.  Substance Use:  No concerns of substance abuse are reported.  The patient describes only occasional alcohol use and no other substance use.  Education:   Secretary/administrator patient graduated from high school and attended [employer] in [place] with a very  good GPA with best subjects in [place] and some relative difficulties with math.  Extracurricular activities included basketball, tennis and golf.  Patient was her high school valedictorian.  Patient left college after getting married.  Medical History:   Past Medical History:  Diagnosis Date   Arthritis    OA AND PAIN LEFT KNEE AND PAIN IN SHOULDERS, HANDS, LOWER BACK   BPV (benign positional vertigo)    Osteoporosis    Seasonal allergies    Tremor    Vertigo          Patient Active Problem List   Diagnosis Date Noted   Cognitive decline 08/27/2021   Avascular necrosis of bone of hip ([facility]) 04/20/2012   Postop Transfusion  09/26/2011   Postop Hyponatremia 09/26/2011   Postop Acute blood loss anemia 09/25/2011   OA (osteoarthritis) of knee 09/23/2011   Hip fracture, right ([facility]) 05/14/2011    Class: Acute              Abuse/Trauma History: No reports of history of abuse/trauma.  Psychiatric History:  No prior psychiatric history.  Family Med/Psych History:  Family History  Problem Relation Age of Onset   Dementia Mother    Heart attack Father    Tremor Neg Hx     Impression/DX:  ILENE WITCHER is an 81 year old female referred for neuropsychological evaluation as part of her larger neurological work-up.  The patient was referred by Sarina Ill, MD.  She was initially requested for neurological work-up by her PCP Milagros Evener, MD due to memory loss.  The patient has a past medical history including arthritis, osteoporosis, protein calorie malnutrition, anxiety, BPPV, fecal incontinence and essential tremor.  Patient has had increased symptoms of anxiety and depression with a recent stressful move into an independent living setting.  Patient's husband has Alzheimer's and the patient's daughter was recently diagnosed with cancer.  Patient's husband lives with the patient in a new apartment.  Patient has been the primary caregiver for her husband.  Disposition/Plan:  We have  set the patient up for formal neuropsychological testing.  Once this is pleated neuropsychological report will be produced with diagnostic considerations and recommendations that are potentially generated from the evaluation.  Feedback we provided to the patient and her family as well as the report made available to her referring neurologist.  Diagnosis:    Cognitive decline  Memory loss         Electronically Signed   _______________________ Ilean Skill, Psy.D. Clinical Neuropsychologist

## 2022-01-03 DIAGNOSIS — F4323 Adjustment disorder with mixed anxiety and depressed mood: Secondary | ICD-10-CM | POA: Diagnosis not present

## 2022-01-07 DIAGNOSIS — H52203 Unspecified astigmatism, bilateral: Secondary | ICD-10-CM | POA: Diagnosis not present

## 2022-01-07 DIAGNOSIS — H26492 Other secondary cataract, left eye: Secondary | ICD-10-CM | POA: Diagnosis not present

## 2022-01-07 DIAGNOSIS — H524 Presbyopia: Secondary | ICD-10-CM | POA: Diagnosis not present

## 2022-01-23 DIAGNOSIS — H26492 Other secondary cataract, left eye: Secondary | ICD-10-CM | POA: Diagnosis not present

## 2022-02-01 ENCOUNTER — Other Ambulatory Visit: Payer: Self-pay | Admitting: Family Medicine

## 2022-02-01 DIAGNOSIS — K5900 Constipation, unspecified: Secondary | ICD-10-CM | POA: Diagnosis not present

## 2022-02-01 DIAGNOSIS — R109 Unspecified abdominal pain: Secondary | ICD-10-CM | POA: Diagnosis not present

## 2022-02-01 DIAGNOSIS — R1900 Intra-abdominal and pelvic swelling, mass and lump, unspecified site: Secondary | ICD-10-CM | POA: Diagnosis not present

## 2022-02-01 DIAGNOSIS — R5381 Other malaise: Secondary | ICD-10-CM | POA: Diagnosis not present

## 2022-02-07 DIAGNOSIS — F4323 Adjustment disorder with mixed anxiety and depressed mood: Secondary | ICD-10-CM | POA: Diagnosis not present

## 2022-02-11 ENCOUNTER — Ambulatory Visit
Admission: RE | Admit: 2022-02-11 | Discharge: 2022-02-11 | Disposition: A | Discharge status: Home / Self Care | Payer: Medicare Other | Source: Ambulatory Visit | Attending: Family Medicine | Admitting: Family Medicine

## 2022-02-11 DIAGNOSIS — R1900 Intra-abdominal and pelvic swelling, mass and lump, unspecified site: Secondary | ICD-10-CM | POA: Diagnosis not present

## 2022-02-15 DIAGNOSIS — R109 Unspecified abdominal pain: Secondary | ICD-10-CM | POA: Diagnosis not present

## 2022-02-15 DIAGNOSIS — Z681 Body mass index (BMI) 19 or less, adult: Secondary | ICD-10-CM | POA: Diagnosis not present

## 2022-02-19 ENCOUNTER — Encounter: Payer: Medicare Other | Attending: Psychology | Admitting: Psychology

## 2022-02-19 ENCOUNTER — Encounter: Payer: Self-pay | Admitting: Psychology

## 2022-02-19 DIAGNOSIS — R439 Unspecified disturbances of smell and taste: Secondary | ICD-10-CM | POA: Diagnosis not present

## 2022-02-19 DIAGNOSIS — R4189 Other symptoms and signs involving cognitive functions and awareness: Secondary | ICD-10-CM | POA: Insufficient documentation

## 2022-02-19 DIAGNOSIS — G250 Essential tremor: Secondary | ICD-10-CM | POA: Diagnosis not present

## 2022-02-19 DIAGNOSIS — H8110 Benign paroxysmal vertigo, unspecified ear: Secondary | ICD-10-CM | POA: Diagnosis not present

## 2022-02-19 DIAGNOSIS — M810 Age-related osteoporosis without current pathological fracture: Secondary | ICD-10-CM | POA: Insufficient documentation

## 2022-02-19 DIAGNOSIS — M1990 Unspecified osteoarthritis, unspecified site: Secondary | ICD-10-CM | POA: Insufficient documentation

## 2022-02-19 DIAGNOSIS — Z636 Dependent relative needing care at home: Secondary | ICD-10-CM | POA: Diagnosis not present

## 2022-02-19 DIAGNOSIS — F411 Generalized anxiety disorder: Secondary | ICD-10-CM | POA: Diagnosis not present

## 2022-02-19 DIAGNOSIS — Z600 Problems of adjustment to life-cycle transitions: Secondary | ICD-10-CM | POA: Diagnosis not present

## 2022-02-19 DIAGNOSIS — R413 Other amnesia: Secondary | ICD-10-CM | POA: Diagnosis not present

## 2022-02-19 DIAGNOSIS — F0670 Mild neurocognitive disorder due to known physiological condition without behavioral disturbance: Secondary | ICD-10-CM

## 2022-02-19 DIAGNOSIS — R630 Anorexia: Secondary | ICD-10-CM | POA: Diagnosis not present

## 2022-02-19 NOTE — Progress Notes (Signed)
Neuropsychological Evaluation   Patient:  Marie Coleman   DOB: 1940-09-06  MR Number: [id number]  Location: [facility] [address], [id number] [place] [id number] Dept: [phone]  Start: 9 AM End: 10 AM  Provider/Observer:     Edgardo Roys PsyD  Chief Complaint:      Chief Complaint  Patient presents with   Memory Loss   Hearing Loss   Dizziness    Reason For Service:      Marie Coleman is an 81 year old female referred for neuropsychological evaluation as part of her larger neurological work-up.  The patient was referred by Sarina Ill, MD.  She was initially requested for neurological work-up by her PCP Milagros Evener, MD due to memory loss.  The patient has a past medical history including arthritis, osteoporosis, protein calorie malnutrition, anxiety, BPPV, fecal incontinence and essential tremor.  Patient has had increased symptoms of anxiety and depression with a recent stressful move into an independent living setting.  Patient's husband has Alzheimer's and the patient's daughter was recently diagnosed with cancer.  Patient's husband lives with the patient in a new apartment.  Patient has been the primary caregiver for her husband.  The patient reports that her memory difficulties tend to come and go and sometimes they are better than others.  The patient reports that she is having more trouble recalling people's names but they often come back later.  The patient denies any geographic disorientation or visual spatial changes.  She also denies any history of hallucinations.  The patient has been diagnosed with essential tremor and her father was also diagnosed with essential tremor.  The patient has an aunt who developed significant cognitive difficulties around age 58.  The patient is described by her daughter as needing everything right and that the patient  has a tendency to take on "too much."  Along with her memory changes, the patient also has had more balance difficulties, hearing loss and vertigo along with her issues related to arthritis, which is impacting her ability to write with her right hand.  The patient's daughter reports that the patient will need to focus on 1 thing at a time whereas the patient historically had been very good at multitasking.  She almost has to focus on 1 thing per day.  Both the patient and her daughter report that there is more difficulty with verbal memory and keeping up with schedules.  Patient is described as having more difficulty keeping steps in order to complete task and figuring out how to complete task with increased decision-making difficulties.  The patient is also having more falls and did have a fall down some steps recently.  The patient worked as a Cabin crew for 40 years and never had difficulties with remembering names and being very effective in her cognitive functioning.  She is now described as her mind going "blank."  The patient is still paying bills but is having more difficulty keeping up with them.  The patient has not been missing bill payments or double paying.  The patient keeps up with her own medications and helps keep up with her husband's medication.  She is still driving and has not been getting lost or having geographic disorientation.  She denies any change in her sense of direction.  Patient utilizes calendar assist and writing things down to keep up with appointments.  Patient does have a lot of orthopedic  issues and pain.  The patient's daughter reports that most of the symptoms were present prior to her moved to assisted living.  Patient's daughter reports that she first began noticing difficulties with the patient around the time the COVID pandemic started associated with significant loss in social interactions.  The patient's daughter was diagnosed with cancer, which was also stressful to the  patient and the patient's husband's diagnosis of Alzheimer's and declining cognitive functioning are also very stressful.  The patient reports that if she is tired she will fall asleep after going to bed around 10 PM.  She reports that if she is thinking about something or stressed, that she may have more difficulty following a sleep or wake up in the middle of the night "thinking" and not able to return to sleep.  She describes a good appetite.   The patient had an MRI brain conducted on 09/18/2021 without any particular indications of acute process.  The only notable issue on the MRI was mild periventricular and subcortical foci nonspecific T2 hyperintensities that are likely simply age-related changes.  Tests Administered: Controlled Oral Word Association Test (COWAT; [employer] Animals)  Wechsler Adult Intelligence Scale, 4th Edition (WAIS-IV) Wechsler Memory Scale, 4th Edition (WMS-IV); Older Adult Battery  Participation Level:   Active  Participation Quality:  Appropriate      Behavioral Observation:    Well Groomed, Alert, and Appropriate.   Test Results:   Initially, an estimation was made as to the patient's premorbid intellectual and cognitive functioning.  The patient was valedictorian of her high school and attended the [employer] maintaining a very good GPA throughout.  The patient has worked for 40 years as a Cabin crew and enjoyed reading and was very active physically throughout life.  I will apply a conservative estimation as to premorbid intellectual and cognitive abilities to be around 1 standard deviation above normative peer group although the patient likely had specific areas of cognitive functioning well exceeding that.  The patient appeared to approach this assessment in an straightforward diligent manner.  To provide full effort throughout.  This does appear to be a fair and valid assessment of the patient's current cognitive functioning.  COWAT            WAIS-IV             Composite Score Summary        Scale Sum of Scaled Scores Composite Score Percentile Rank 95% Conf. Interval Qualitative Description  Verbal Comprehension 26 VCI 93 32 88-99 Average  Perceptual Reasoning 24 PRI 88 21 82-95 Low Average  Working Memory 17 WMI 92 30 86-99 Average  Processing Speed 24 PSI 111 77 102-118 High Average  Full Scale 91 FSIQ 94 34 90-98 Average  General Ability 50 GAI 89 23 84-94 Low Average    The patient was administered the Wechsler Adult Intelligence Scale-IV to provide a thorough assessment of multiple cognitive domains and a standardized well normed battery of measures.  The current scores should not be seen as indicative of the patient's lifelong premorbid/intellectual functioning as the patient and her family are describing recent changes in cognitive functioning.  These in fact could represent her current functioning levels relative to a normative sample.  We calculated to global composite scores for this assessment.  The patient produced a full-scale IQ score of 94 which falls to 34th percentile and is in the average range.  This is a little over 1 standard deviation below predicted levels  and does suggest 1 or more area of cognitive functioning to be different than it has historically been.  We also calculated the patient's general abilities index score which places less emphasis on attentional and focus execute abilities as these tend to be the most variable and impacted by acute changes.  The patient produced a general abilities index score of 89 which falls at the 23rd percentile and is in the low average range relative to an age-matched comparison group.  This is significantly below predicted levels and suggest a nearly 2 standard deviation level of performance below predicted levels.             Verbal Comprehension Subtests Summary      Subtest Raw Score Scaled Score Percentile Rank Reference Group Scaled Score SEM   Similarities '18 8 25 6 '$ 0.90  Vocabulary 34 10 50 10 0.73  Information '8 8 25 7 '$ 0.73  (Comprehension) 23 11 63 10 1.08      The patient produced a verbal comprehension index score of 93 which falls at the 32nd percentile and is in the average range relative to normative population.  There was some degree of scatter noted in subtest performance.  The patient displayed her best performance on measures of social judgment and comprehension abilities.  She performed in the average range on measures of verbal reasoning and problem-solving, vocabulary knowledge and her general fund of information.  The scores are somewhat below predicted levels suggesting some difficulty retrieving information that should have been well learned in rehearsed through the years.             Perceptual Reasoning Subtests Summary      Subtest Raw Score Scaled Score Percentile Rank Reference Group Scaled Score SEM  Block Design '20 8 25 5 '$ 1.34  Matrix Reasoning '6 7 16 3 '$ 1.12  Visual Puzzles 8 9 37 6 1.27  (Picture Completion) '6 8 25 5 '$ 1.24      The patient produced a perceptual reasoning index score of 88 which falls at the 21st percentile and is in the low average range relative to a normative population.  There was little scatter noted in subtest performance and the patient performed at the average to lower into the average range relative to a normative population.  The patient showed some difficulties with visual reasoning and problem-solving capacity and a mild loss relative to premorbid predictions with regard to visual analysis and organization, visual estimation and predictive ability and her ability to identify visual anomalies within a visual gestalt.             Working Electrical engineer Raw Score Scaled Score Percentile Rank Reference Group Scaled Score SEM  Digit Span '18 8 25 5 '$ 0.85  Arithmetic 11 9 37 8 0.99      The patient produced a working memory index score of 92 which falls at the  30th percentile and is in the average range relative to a normative population.  Little scatter was noted in subtest performance with the patient performing in the average range with regard to auditory encoding capacity and her ability to actively process information in her auditory register.             Processing Speed Subtests Summary      Subtest Raw Score Scaled Score Percentile Rank Reference Group Scaled Score SEM  Symbol Search 23 12 75 6 1.12  Coding 48 12 75 6 1.12  The patient's best area of performance was with regard to her information processing speed/focus execute abilities.  The patient produced a processing speed index score of 111 which falls at the 77th percentile and is in the average range.  This is essentially consistent with premorbid estimates with the patient doing very well on measures of visual scanning, visual searching and overall speed of mental operations.       WMS-IV           Index Score Summary        Index Sum of Scaled Scores Index Score Percentile Rank 95% Confidence Interval Qualitative Descriptor  Auditory Memory (AMI) 32 88 21 82-95 Low Average  Visual Memory (VMI) 15 87 19 82-92 Low Average  Immediate Memory (IMI) 26 91 27 85-98 Average  Delayed Memory (DMI) 21 81 10 75-90 Low Average    In order to objectively assess a wide range of memory and learning components the patient was administered the Wechsler Memory Scale-IV for older adults.  The patient's specific complaints have to do with difficulty around learning new information and keeping up with schedules and recent events.  The patient showed some mild weakness relative to premorbid estimations related to auditory encoding capacity.  The patient did better with regard to visual encoding as she performed at the 63rd percentile on measures of visual encoding functions on the Wechsler Memory Scale.  Neither of these achieved encoding capacities should limit or create significant impact on her  ability to store, organize and retrieve new information.  Breaking the patient's memory functions down between auditory versus visual memory the patient produced an auditory memory index score of 88 which falls at the 21st percentile and is in the low average range relative to normative population.  The patient produced a similar level of function with regard to visual memory as she produced a visual memory index score of 88 which fell at the 19th percentile and was also in the low average range relative to normative population.  These are roughly approaching 2 standard deviations below predicted levels and are consistent with subjective reports by the patient and her daughter related to weakening with regard to learning new information and it appears to be consistent between auditory and visual learning and memory.  The patient produced an immediate memory index score of 91 which falls at the 27th percentile and is in the lower end of the average range relative to a normative population.  This is below predicted levels with regard to immediate learning.  The patient produced a delayed memory index score of 81 which falls at the 10th percentile and is in the low average range.  This is significantly below predicted levels and is in fact her lowest score throughout the entire battery of measures.  We also placed the patient in a recognition/cued recall format for many of her memory challenges.  The patient did show some improvement under recognition/cued recall with the greatest improvement related to visual memory and learning suggesting that some of her memory difficulties have to do with retrieval difficulties rather than an inability to store and organize new information.  Mild difficulties with regard to auditory encoding and less so for visual encoding, mild difficulties with regard to organization, storage of newly learned information with the greatest impact having to do with factors of free recall of  recently learned information and improvement under cued recognition type settings are noted.            Primary Subtest  Scaled Score Summary      Subtest Domain Raw Score Scaled Score Percentile Rank  Logical Memory I AM '15 6 9  '$ Logical Memory II AM 12 9 37  Verbal Paired Associates I AM '13 8 25  '$ Verbal Paired Associates II AM 4 9 37  Visual Reproduction I VM 30 12 75  Visual Reproduction II VM 0 3 1  Symbol Span VWM 15 11 63                Auditory Memory Process Score Summary      Process Score Raw Score Scaled Score Percentile Rank Cumulative Percentage (Base Rate)  LM II Recognition 17 - - 26-50%  VPA II Recognition 24 - - 17-25%                   Visual Memory Process Score Summary      Process Score Raw Score Scaled Score Percentile Rank Cumulative Percentage (Base Rate)  VR II Recognition 5 - - >75%      Summary of Results:   The patient displayed indications of moderate loss as far as global cognitive functioning with the patient performing between 1 and 2 standard deviations below predicted levels of premorbid functioning.  The patient's greatest area of weakness had to do with delayed memory although a significant amount of these memory deficits were likely related to retrieval of information rather than an inability to encode, store and organize new information.  There was a balance between auditory versus visual memory weaknesses.  The patient's best area function had to do with information processing speed and focus execute abilities with these functions consistent with premorbid estimates of functioning.  The patient showed mild to moderate weaknesses with regard to her retrieval of well learned longstanding information, visual-spatial weaknesses without severe deficits, and weaknesses with regard to auditory encoding relative to premorbid functioning levels.  Impression/Diagnosis:   Overall, the results of the current neuropsychological evaluation do show  consistencies between the patient and her daughter's reports of current difficulties including retrieval of recently learned/experienced information, some difficulty keeping up and organizing her schedule, and varying memory difficulties with sometimes better than others.  Retrieval of people's names are noted.  The patient does show some indications of weakening visual spatial and visual constructional abilities but a denial of any geographic disorientation or clear visual spatial changes subjectively.  There are reports of difficulty with executive functioning to some degree in keeping up with ordered task or doing more than 1 task at a time or noted.  While the patient does have some tremors that are apparently attributed to essential tremor with family history of essential tremor there are no symptoms consistent with a Parkinson's-like condition.  The patient has had some falls recently which are concerning.  The patient is adapting to these memory difficulties by utilizing calendars and reminders and is doing so somewhat effectively.  Of note, is the fact that the patient has been having more symptoms of anxiety with significant stressors during the COVID pandemic changes including her husband and developing an apparent degenerative dementia, moving into an assisted living with the patient being the primary caregiver of her husband, and the patient's daughter being diagnosed with significant medical issues herself.  As far as diagnostic considerations, the pattern of subjective symptoms, information available through medical records and objective neuropsychological assessment are not particularly consistent with degenerative condition such as Alzheimer's dementia, Lewy body dementia or other progressive neurological disorders.  There are no  clear indications of cortical or subcortical types of degenerative process.  The patient has been having some degree of difficulty over the past several years and it does  appear to be worsening to some degree.  The patient does show cognitive decline from predicted levels of premorbid functioning.  There are very limited indications of changes on MRI and those are generally consistent with age-related changes.  In this particular case, and will be necessary and essential for repeat testing to be conducted.  Anxiety and stress do appear to be playing a primary role here but we cannot completely rule out an Alzheimer's type condition.  We will need repeat testing in 9 to 12 months for direct comparisons to her current level of performance to assess for progressive changes.  The patient would meet a diagnosis of mild neurocognitive changes and has indications of changes from premorbid functioning level and multiple cognitive domains.  As far as treatment recommendations, we should primarily focus on variables that we do have some control over.  Continuing to work on expectations and responsibilities the patient places on her self especially regarding care for her husband will be needed.  The patient must attend to her own wellbeing including working on good sleep hygiene and maintaining good sleep patterns, good dietary and nutritional patterns and maintaining an physically active life.  The patient has had multiple falls at this physical activity should be done in a safe but sustained way.  The patient is not currently taking any type of SSRI medication and this may be helpful addition to her medication strategies.  We will set up the patient for return neuropsychological assessment in approximately 9 months.  Diagnosis:    Mild neurocognitive disorder due to multiple etiologies  Generalized anxiety disorder   _____________________ Ilean Skill, Psy.D. Clinical Neuropsychologist

## 2022-03-18 DIAGNOSIS — M545 Low back pain, unspecified: Secondary | ICD-10-CM | POA: Diagnosis not present

## 2022-03-18 DIAGNOSIS — M6281 Muscle weakness (generalized): Secondary | ICD-10-CM | POA: Diagnosis not present

## 2022-03-18 DIAGNOSIS — R262 Difficulty in walking, not elsewhere classified: Secondary | ICD-10-CM | POA: Diagnosis not present

## 2022-03-18 DIAGNOSIS — R296 Repeated falls: Secondary | ICD-10-CM | POA: Diagnosis not present

## 2022-03-19 DIAGNOSIS — R296 Repeated falls: Secondary | ICD-10-CM | POA: Diagnosis not present

## 2022-03-19 DIAGNOSIS — M6281 Muscle weakness (generalized): Secondary | ICD-10-CM | POA: Diagnosis not present

## 2022-03-19 DIAGNOSIS — R262 Difficulty in walking, not elsewhere classified: Secondary | ICD-10-CM | POA: Diagnosis not present

## 2022-03-19 DIAGNOSIS — G8929 Other chronic pain: Secondary | ICD-10-CM | POA: Diagnosis not present

## 2022-03-19 DIAGNOSIS — M545 Low back pain, unspecified: Secondary | ICD-10-CM | POA: Diagnosis not present

## 2022-03-19 DIAGNOSIS — M25561 Pain in right knee: Secondary | ICD-10-CM | POA: Diagnosis not present

## 2022-03-19 DIAGNOSIS — Z96652 Presence of left artificial knee joint: Secondary | ICD-10-CM | POA: Diagnosis not present

## 2022-03-21 DIAGNOSIS — R262 Difficulty in walking, not elsewhere classified: Secondary | ICD-10-CM | POA: Diagnosis not present

## 2022-03-21 DIAGNOSIS — M6281 Muscle weakness (generalized): Secondary | ICD-10-CM | POA: Diagnosis not present

## 2022-03-21 DIAGNOSIS — R296 Repeated falls: Secondary | ICD-10-CM | POA: Diagnosis not present

## 2022-03-21 DIAGNOSIS — M545 Low back pain, unspecified: Secondary | ICD-10-CM | POA: Diagnosis not present

## 2022-03-26 DIAGNOSIS — M6281 Muscle weakness (generalized): Secondary | ICD-10-CM | POA: Diagnosis not present

## 2022-03-26 DIAGNOSIS — R296 Repeated falls: Secondary | ICD-10-CM | POA: Diagnosis not present

## 2022-03-26 DIAGNOSIS — R262 Difficulty in walking, not elsewhere classified: Secondary | ICD-10-CM | POA: Diagnosis not present

## 2022-03-26 DIAGNOSIS — M545 Low back pain, unspecified: Secondary | ICD-10-CM | POA: Diagnosis not present

## 2022-03-27 DIAGNOSIS — Z23 Encounter for immunization: Secondary | ICD-10-CM | POA: Diagnosis not present

## 2022-03-27 DIAGNOSIS — F4323 Adjustment disorder with mixed anxiety and depressed mood: Secondary | ICD-10-CM | POA: Diagnosis not present

## 2022-03-28 DIAGNOSIS — R296 Repeated falls: Secondary | ICD-10-CM | POA: Diagnosis not present

## 2022-03-28 DIAGNOSIS — R262 Difficulty in walking, not elsewhere classified: Secondary | ICD-10-CM | POA: Diagnosis not present

## 2022-03-28 DIAGNOSIS — M545 Low back pain, unspecified: Secondary | ICD-10-CM | POA: Diagnosis not present

## 2022-03-28 DIAGNOSIS — M6281 Muscle weakness (generalized): Secondary | ICD-10-CM | POA: Diagnosis not present

## 2022-04-02 DIAGNOSIS — R262 Difficulty in walking, not elsewhere classified: Secondary | ICD-10-CM | POA: Diagnosis not present

## 2022-04-02 DIAGNOSIS — R296 Repeated falls: Secondary | ICD-10-CM | POA: Diagnosis not present

## 2022-04-02 DIAGNOSIS — M545 Low back pain, unspecified: Secondary | ICD-10-CM | POA: Diagnosis not present

## 2022-04-02 DIAGNOSIS — M6281 Muscle weakness (generalized): Secondary | ICD-10-CM | POA: Diagnosis not present

## 2022-04-04 DIAGNOSIS — R262 Difficulty in walking, not elsewhere classified: Secondary | ICD-10-CM | POA: Diagnosis not present

## 2022-04-04 DIAGNOSIS — L821 Other seborrheic keratosis: Secondary | ICD-10-CM | POA: Diagnosis not present

## 2022-04-04 DIAGNOSIS — L820 Inflamed seborrheic keratosis: Secondary | ICD-10-CM | POA: Diagnosis not present

## 2022-04-04 DIAGNOSIS — C44619 Basal cell carcinoma of skin of left upper limb, including shoulder: Secondary | ICD-10-CM | POA: Diagnosis not present

## 2022-04-04 DIAGNOSIS — L814 Other melanin hyperpigmentation: Secondary | ICD-10-CM | POA: Diagnosis not present

## 2022-04-04 DIAGNOSIS — L570 Actinic keratosis: Secondary | ICD-10-CM | POA: Diagnosis not present

## 2022-04-04 DIAGNOSIS — Z85828 Personal history of other malignant neoplasm of skin: Secondary | ICD-10-CM | POA: Diagnosis not present

## 2022-04-04 DIAGNOSIS — M545 Low back pain, unspecified: Secondary | ICD-10-CM | POA: Diagnosis not present

## 2022-04-04 DIAGNOSIS — C44519 Basal cell carcinoma of skin of other part of trunk: Secondary | ICD-10-CM | POA: Diagnosis not present

## 2022-04-04 DIAGNOSIS — M6281 Muscle weakness (generalized): Secondary | ICD-10-CM | POA: Diagnosis not present

## 2022-04-04 DIAGNOSIS — D0461 Carcinoma in situ of skin of right upper limb, including shoulder: Secondary | ICD-10-CM | POA: Diagnosis not present

## 2022-04-04 DIAGNOSIS — R296 Repeated falls: Secondary | ICD-10-CM | POA: Diagnosis not present

## 2022-04-04 DIAGNOSIS — D225 Melanocytic nevi of trunk: Secondary | ICD-10-CM | POA: Diagnosis not present

## 2022-04-09 DIAGNOSIS — R296 Repeated falls: Secondary | ICD-10-CM | POA: Diagnosis not present

## 2022-04-09 DIAGNOSIS — R262 Difficulty in walking, not elsewhere classified: Secondary | ICD-10-CM | POA: Diagnosis not present

## 2022-04-09 DIAGNOSIS — M545 Low back pain, unspecified: Secondary | ICD-10-CM | POA: Diagnosis not present

## 2022-04-09 DIAGNOSIS — M6281 Muscle weakness (generalized): Secondary | ICD-10-CM | POA: Diagnosis not present

## 2022-04-11 DIAGNOSIS — R262 Difficulty in walking, not elsewhere classified: Secondary | ICD-10-CM | POA: Diagnosis not present

## 2022-04-11 DIAGNOSIS — M6281 Muscle weakness (generalized): Secondary | ICD-10-CM | POA: Diagnosis not present

## 2022-04-11 DIAGNOSIS — M545 Low back pain, unspecified: Secondary | ICD-10-CM | POA: Diagnosis not present

## 2022-04-11 DIAGNOSIS — R296 Repeated falls: Secondary | ICD-10-CM | POA: Diagnosis not present

## 2022-04-16 DIAGNOSIS — R262 Difficulty in walking, not elsewhere classified: Secondary | ICD-10-CM | POA: Diagnosis not present

## 2022-04-16 DIAGNOSIS — M6281 Muscle weakness (generalized): Secondary | ICD-10-CM | POA: Diagnosis not present

## 2022-04-16 DIAGNOSIS — R296 Repeated falls: Secondary | ICD-10-CM | POA: Diagnosis not present

## 2022-04-16 DIAGNOSIS — M545 Low back pain, unspecified: Secondary | ICD-10-CM | POA: Diagnosis not present

## 2022-04-18 DIAGNOSIS — R296 Repeated falls: Secondary | ICD-10-CM | POA: Diagnosis not present

## 2022-04-18 DIAGNOSIS — M545 Low back pain, unspecified: Secondary | ICD-10-CM | POA: Diagnosis not present

## 2022-04-18 DIAGNOSIS — R262 Difficulty in walking, not elsewhere classified: Secondary | ICD-10-CM | POA: Diagnosis not present

## 2022-04-18 DIAGNOSIS — M6281 Muscle weakness (generalized): Secondary | ICD-10-CM | POA: Diagnosis not present

## 2022-04-22 DIAGNOSIS — E46 Unspecified protein-calorie malnutrition: Secondary | ICD-10-CM | POA: Diagnosis not present

## 2022-04-22 DIAGNOSIS — M5416 Radiculopathy, lumbar region: Secondary | ICD-10-CM | POA: Diagnosis not present

## 2022-04-23 DIAGNOSIS — M6281 Muscle weakness (generalized): Secondary | ICD-10-CM | POA: Diagnosis not present

## 2022-04-23 DIAGNOSIS — M545 Low back pain, unspecified: Secondary | ICD-10-CM | POA: Diagnosis not present

## 2022-04-23 DIAGNOSIS — R296 Repeated falls: Secondary | ICD-10-CM | POA: Diagnosis not present

## 2022-04-23 DIAGNOSIS — R262 Difficulty in walking, not elsewhere classified: Secondary | ICD-10-CM | POA: Diagnosis not present

## 2022-04-24 DIAGNOSIS — M6281 Muscle weakness (generalized): Secondary | ICD-10-CM | POA: Diagnosis not present

## 2022-04-24 DIAGNOSIS — R42 Dizziness and giddiness: Secondary | ICD-10-CM | POA: Diagnosis not present

## 2022-04-24 DIAGNOSIS — R262 Difficulty in walking, not elsewhere classified: Secondary | ICD-10-CM | POA: Diagnosis not present

## 2022-04-24 DIAGNOSIS — M545 Low back pain, unspecified: Secondary | ICD-10-CM | POA: Diagnosis not present

## 2022-04-24 DIAGNOSIS — M810 Age-related osteoporosis without current pathological fracture: Secondary | ICD-10-CM | POA: Diagnosis not present

## 2022-04-24 DIAGNOSIS — Z0000 Encounter for general adult medical examination without abnormal findings: Secondary | ICD-10-CM | POA: Diagnosis not present

## 2022-04-24 DIAGNOSIS — M79644 Pain in right finger(s): Secondary | ICD-10-CM | POA: Diagnosis not present

## 2022-04-24 DIAGNOSIS — R296 Repeated falls: Secondary | ICD-10-CM | POA: Diagnosis not present

## 2022-04-24 DIAGNOSIS — F419 Anxiety disorder, unspecified: Secondary | ICD-10-CM | POA: Diagnosis not present

## 2022-04-30 DIAGNOSIS — R262 Difficulty in walking, not elsewhere classified: Secondary | ICD-10-CM | POA: Diagnosis not present

## 2022-04-30 DIAGNOSIS — M6281 Muscle weakness (generalized): Secondary | ICD-10-CM | POA: Diagnosis not present

## 2022-04-30 DIAGNOSIS — M545 Low back pain, unspecified: Secondary | ICD-10-CM | POA: Diagnosis not present

## 2022-04-30 DIAGNOSIS — R296 Repeated falls: Secondary | ICD-10-CM | POA: Diagnosis not present

## 2022-05-02 DIAGNOSIS — M545 Low back pain, unspecified: Secondary | ICD-10-CM | POA: Diagnosis not present

## 2022-05-02 DIAGNOSIS — R296 Repeated falls: Secondary | ICD-10-CM | POA: Diagnosis not present

## 2022-05-02 DIAGNOSIS — M6281 Muscle weakness (generalized): Secondary | ICD-10-CM | POA: Diagnosis not present

## 2022-05-02 DIAGNOSIS — R262 Difficulty in walking, not elsewhere classified: Secondary | ICD-10-CM | POA: Diagnosis not present

## 2022-05-06 DIAGNOSIS — M545 Low back pain, unspecified: Secondary | ICD-10-CM | POA: Diagnosis not present

## 2022-05-06 DIAGNOSIS — M6281 Muscle weakness (generalized): Secondary | ICD-10-CM | POA: Diagnosis not present

## 2022-05-06 DIAGNOSIS — R296 Repeated falls: Secondary | ICD-10-CM | POA: Diagnosis not present

## 2022-05-06 DIAGNOSIS — R262 Difficulty in walking, not elsewhere classified: Secondary | ICD-10-CM | POA: Diagnosis not present

## 2022-05-07 DIAGNOSIS — M5416 Radiculopathy, lumbar region: Secondary | ICD-10-CM | POA: Diagnosis not present

## 2022-05-08 DIAGNOSIS — M545 Low back pain, unspecified: Secondary | ICD-10-CM | POA: Diagnosis not present

## 2022-05-08 DIAGNOSIS — R296 Repeated falls: Secondary | ICD-10-CM | POA: Diagnosis not present

## 2022-05-08 DIAGNOSIS — M6281 Muscle weakness (generalized): Secondary | ICD-10-CM | POA: Diagnosis not present

## 2022-05-08 DIAGNOSIS — R262 Difficulty in walking, not elsewhere classified: Secondary | ICD-10-CM | POA: Diagnosis not present

## 2022-05-14 DIAGNOSIS — R262 Difficulty in walking, not elsewhere classified: Secondary | ICD-10-CM | POA: Diagnosis not present

## 2022-05-14 DIAGNOSIS — R296 Repeated falls: Secondary | ICD-10-CM | POA: Diagnosis not present

## 2022-05-14 DIAGNOSIS — M545 Low back pain, unspecified: Secondary | ICD-10-CM | POA: Diagnosis not present

## 2022-05-14 DIAGNOSIS — M6281 Muscle weakness (generalized): Secondary | ICD-10-CM | POA: Diagnosis not present

## 2022-05-16 DIAGNOSIS — M545 Low back pain, unspecified: Secondary | ICD-10-CM | POA: Diagnosis not present

## 2022-05-16 DIAGNOSIS — R262 Difficulty in walking, not elsewhere classified: Secondary | ICD-10-CM | POA: Diagnosis not present

## 2022-05-16 DIAGNOSIS — M6281 Muscle weakness (generalized): Secondary | ICD-10-CM | POA: Diagnosis not present

## 2022-05-16 DIAGNOSIS — R296 Repeated falls: Secondary | ICD-10-CM | POA: Diagnosis not present

## 2022-05-21 DIAGNOSIS — M545 Low back pain, unspecified: Secondary | ICD-10-CM | POA: Diagnosis not present

## 2022-05-21 DIAGNOSIS — M6281 Muscle weakness (generalized): Secondary | ICD-10-CM | POA: Diagnosis not present

## 2022-05-21 DIAGNOSIS — R296 Repeated falls: Secondary | ICD-10-CM | POA: Diagnosis not present

## 2022-05-21 DIAGNOSIS — R262 Difficulty in walking, not elsewhere classified: Secondary | ICD-10-CM | POA: Diagnosis not present

## 2022-05-23 DIAGNOSIS — R262 Difficulty in walking, not elsewhere classified: Secondary | ICD-10-CM | POA: Diagnosis not present

## 2022-05-23 DIAGNOSIS — M6281 Muscle weakness (generalized): Secondary | ICD-10-CM | POA: Diagnosis not present

## 2022-05-23 DIAGNOSIS — R296 Repeated falls: Secondary | ICD-10-CM | POA: Diagnosis not present

## 2022-05-23 DIAGNOSIS — M545 Low back pain, unspecified: Secondary | ICD-10-CM | POA: Diagnosis not present

## 2022-05-28 DIAGNOSIS — R296 Repeated falls: Secondary | ICD-10-CM | POA: Diagnosis not present

## 2022-05-28 DIAGNOSIS — M6281 Muscle weakness (generalized): Secondary | ICD-10-CM | POA: Diagnosis not present

## 2022-05-28 DIAGNOSIS — R262 Difficulty in walking, not elsewhere classified: Secondary | ICD-10-CM | POA: Diagnosis not present

## 2022-05-28 DIAGNOSIS — M545 Low back pain, unspecified: Secondary | ICD-10-CM | POA: Diagnosis not present

## 2022-05-30 DIAGNOSIS — R262 Difficulty in walking, not elsewhere classified: Secondary | ICD-10-CM | POA: Diagnosis not present

## 2022-05-30 DIAGNOSIS — R296 Repeated falls: Secondary | ICD-10-CM | POA: Diagnosis not present

## 2022-05-30 DIAGNOSIS — M545 Low back pain, unspecified: Secondary | ICD-10-CM | POA: Diagnosis not present

## 2022-05-30 DIAGNOSIS — M6281 Muscle weakness (generalized): Secondary | ICD-10-CM | POA: Diagnosis not present

## 2022-06-04 DIAGNOSIS — M6281 Muscle weakness (generalized): Secondary | ICD-10-CM | POA: Diagnosis not present

## 2022-06-04 DIAGNOSIS — R296 Repeated falls: Secondary | ICD-10-CM | POA: Diagnosis not present

## 2022-06-04 DIAGNOSIS — M545 Low back pain, unspecified: Secondary | ICD-10-CM | POA: Diagnosis not present

## 2022-06-04 DIAGNOSIS — R262 Difficulty in walking, not elsewhere classified: Secondary | ICD-10-CM | POA: Diagnosis not present

## 2022-06-06 DIAGNOSIS — R262 Difficulty in walking, not elsewhere classified: Secondary | ICD-10-CM | POA: Diagnosis not present

## 2022-06-06 DIAGNOSIS — M6281 Muscle weakness (generalized): Secondary | ICD-10-CM | POA: Diagnosis not present

## 2022-06-06 DIAGNOSIS — R296 Repeated falls: Secondary | ICD-10-CM | POA: Diagnosis not present

## 2022-06-06 DIAGNOSIS — M545 Low back pain, unspecified: Secondary | ICD-10-CM | POA: Diagnosis not present

## 2022-06-11 ENCOUNTER — Ambulatory Visit: Payer: Medicare Other | Admitting: Psychology

## 2022-06-11 DIAGNOSIS — R296 Repeated falls: Secondary | ICD-10-CM | POA: Diagnosis not present

## 2022-06-11 DIAGNOSIS — R262 Difficulty in walking, not elsewhere classified: Secondary | ICD-10-CM | POA: Diagnosis not present

## 2022-06-11 DIAGNOSIS — M6281 Muscle weakness (generalized): Secondary | ICD-10-CM | POA: Diagnosis not present

## 2022-06-11 DIAGNOSIS — M545 Low back pain, unspecified: Secondary | ICD-10-CM | POA: Diagnosis not present

## 2022-06-13 DIAGNOSIS — R262 Difficulty in walking, not elsewhere classified: Secondary | ICD-10-CM | POA: Diagnosis not present

## 2022-06-13 DIAGNOSIS — R296 Repeated falls: Secondary | ICD-10-CM | POA: Diagnosis not present

## 2022-06-13 DIAGNOSIS — M6281 Muscle weakness (generalized): Secondary | ICD-10-CM | POA: Diagnosis not present

## 2022-06-13 DIAGNOSIS — M545 Low back pain, unspecified: Secondary | ICD-10-CM | POA: Diagnosis not present

## 2022-06-18 DIAGNOSIS — M545 Low back pain, unspecified: Secondary | ICD-10-CM | POA: Diagnosis not present

## 2022-06-18 DIAGNOSIS — M6281 Muscle weakness (generalized): Secondary | ICD-10-CM | POA: Diagnosis not present

## 2022-06-18 DIAGNOSIS — R262 Difficulty in walking, not elsewhere classified: Secondary | ICD-10-CM | POA: Diagnosis not present

## 2022-06-18 DIAGNOSIS — R296 Repeated falls: Secondary | ICD-10-CM | POA: Diagnosis not present

## 2022-06-24 ENCOUNTER — Encounter: Payer: Medicare Other | Attending: Psychology | Admitting: Psychology

## 2022-06-24 DIAGNOSIS — F0670 Mild neurocognitive disorder due to known physiological condition without behavioral disturbance: Secondary | ICD-10-CM

## 2022-06-24 DIAGNOSIS — F411 Generalized anxiety disorder: Secondary | ICD-10-CM

## 2022-06-25 DIAGNOSIS — M6281 Muscle weakness (generalized): Secondary | ICD-10-CM | POA: Diagnosis not present

## 2022-06-25 DIAGNOSIS — M545 Low back pain, unspecified: Secondary | ICD-10-CM | POA: Diagnosis not present

## 2022-06-25 DIAGNOSIS — R296 Repeated falls: Secondary | ICD-10-CM | POA: Diagnosis not present

## 2022-06-25 DIAGNOSIS — R262 Difficulty in walking, not elsewhere classified: Secondary | ICD-10-CM | POA: Diagnosis not present

## 2022-06-26 DIAGNOSIS — M1811 Unilateral primary osteoarthritis of first carpometacarpal joint, right hand: Secondary | ICD-10-CM | POA: Diagnosis not present

## 2022-06-26 DIAGNOSIS — S63212A Subluxation of metacarpophalangeal joint of right middle finger, initial encounter: Secondary | ICD-10-CM | POA: Diagnosis not present

## 2022-06-26 DIAGNOSIS — M79641 Pain in right hand: Secondary | ICD-10-CM | POA: Diagnosis not present

## 2022-06-26 DIAGNOSIS — R52 Pain, unspecified: Secondary | ICD-10-CM | POA: Diagnosis not present

## 2022-06-27 DIAGNOSIS — M6281 Muscle weakness (generalized): Secondary | ICD-10-CM | POA: Diagnosis not present

## 2022-06-27 DIAGNOSIS — M545 Low back pain, unspecified: Secondary | ICD-10-CM | POA: Diagnosis not present

## 2022-06-27 DIAGNOSIS — R296 Repeated falls: Secondary | ICD-10-CM | POA: Diagnosis not present

## 2022-06-27 DIAGNOSIS — R262 Difficulty in walking, not elsewhere classified: Secondary | ICD-10-CM | POA: Diagnosis not present

## 2022-07-02 DIAGNOSIS — R262 Difficulty in walking, not elsewhere classified: Secondary | ICD-10-CM | POA: Diagnosis not present

## 2022-07-02 DIAGNOSIS — M545 Low back pain, unspecified: Secondary | ICD-10-CM | POA: Diagnosis not present

## 2022-07-02 DIAGNOSIS — R296 Repeated falls: Secondary | ICD-10-CM | POA: Diagnosis not present

## 2022-07-02 DIAGNOSIS — M6281 Muscle weakness (generalized): Secondary | ICD-10-CM | POA: Diagnosis not present

## 2022-07-04 DIAGNOSIS — R262 Difficulty in walking, not elsewhere classified: Secondary | ICD-10-CM | POA: Diagnosis not present

## 2022-07-04 DIAGNOSIS — M545 Low back pain, unspecified: Secondary | ICD-10-CM | POA: Diagnosis not present

## 2022-07-04 DIAGNOSIS — M6281 Muscle weakness (generalized): Secondary | ICD-10-CM | POA: Diagnosis not present

## 2022-07-04 DIAGNOSIS — R296 Repeated falls: Secondary | ICD-10-CM | POA: Diagnosis not present

## 2022-07-08 DIAGNOSIS — M6281 Muscle weakness (generalized): Secondary | ICD-10-CM | POA: Diagnosis not present

## 2022-07-08 DIAGNOSIS — F4323 Adjustment disorder with mixed anxiety and depressed mood: Secondary | ICD-10-CM | POA: Diagnosis not present

## 2022-07-08 DIAGNOSIS — R262 Difficulty in walking, not elsewhere classified: Secondary | ICD-10-CM | POA: Diagnosis not present

## 2022-07-08 DIAGNOSIS — M545 Low back pain, unspecified: Secondary | ICD-10-CM | POA: Diagnosis not present

## 2022-07-08 DIAGNOSIS — R296 Repeated falls: Secondary | ICD-10-CM | POA: Diagnosis not present

## 2022-07-22 DIAGNOSIS — Z1231 Encounter for screening mammogram for malignant neoplasm of breast: Secondary | ICD-10-CM | POA: Diagnosis not present

## 2022-07-25 DIAGNOSIS — R928 Other abnormal and inconclusive findings on diagnostic imaging of breast: Secondary | ICD-10-CM | POA: Diagnosis not present

## 2022-07-25 DIAGNOSIS — R922 Inconclusive mammogram: Secondary | ICD-10-CM | POA: Diagnosis not present

## 2022-08-04 ENCOUNTER — Encounter: Payer: Self-pay | Admitting: Psychology

## 2022-08-04 NOTE — Progress Notes (Signed)
Neuropsychology Visit  Patient:  Marie Coleman   DOB: 1941-01-30  MR Number: [id number]  Location: [facility] [place], [place] [id number] [place] [address] Dept: [phone]  Date of Service: 06/24/2022  Start: 2 PM End: 3 PM  Today's visit was an in person visit that was conducted in my outpatient clinic office.  Today we provided feedback regarding the results of the recent neuropsychological evaluation.  I have included the reason for service for this evaluation below as well as the summary.  The complete evaluation can be found in the patient's EMR dated 02/19/2022.  Duration of Service: 1 Hour  Provider/Observer:     Edgardo Roys PsyD  Chief Complaint:      Chief Complaint  Patient presents with   Memory Loss   Hearing Loss   Dizziness    Reason For Service:     SHELLYE KLOEPFER is an 82 year old female referred for neuropsychological evaluation as part of her larger neurological work-up.  The patient was referred by Sarina Ill, MD.  She was initially requested for neurological work-up by her PCP Milagros Evener, MD due to memory loss.  The patient has a past medical history including arthritis, osteoporosis, protein calorie malnutrition, anxiety, BPPV, fecal incontinence and essential tremor.  Patient has had increased symptoms of anxiety and depression with a recent stressful move into an independent living setting.  Patient's husband has Alzheimer's and the patient's daughter was recently diagnosed with cancer.  Patient's husband lives with the patient in a new apartment.  Patient has been the primary caregiver for her husband.  The patient reports that her memory difficulties tend to come and go and sometimes they are better than others.  The patient reports that she is having more trouble recalling people's names but they often come back later.  The patient denies  any geographic disorientation or visual spatial changes.  She also denies any history of hallucinations.  The patient has been diagnosed with essential tremor and her father was also diagnosed with essential tremor.  The patient has an aunt who developed significant cognitive difficulties around age 76.  The patient is described by her daughter as needing everything right and that the patient has a tendency to take on "too much."  Along with her memory changes, the patient also has had more balance difficulties, hearing loss and vertigo along with her issues related to arthritis, which is impacting her ability to write with her right hand.  The patient's daughter reports that the patient will need to focus on 1 thing at a time whereas the patient historically had been very good at multitasking.  She almost has to focus on 1 thing per day.  Both the patient and her daughter report that there is more difficulty with verbal memory and keeping up with schedules.  Patient is described as having more difficulty keeping steps in order to complete task and figuring out how to complete task with increased decision-making difficulties.  The patient is also having more falls and did have a fall down some steps recently.  The patient worked as a Cabin crew for 40 years and never had difficulties with remembering names and being very effective in her cognitive functioning.  She is now described as her mind going "blank."  The patient is still paying bills but is having more difficulty keeping up with them.  The patient has not been missing bill  payments or double paying.  The patient keeps up with her own medications and helps keep up with her husband's medication.  She is still driving and has not been getting lost or having geographic disorientation.  She denies any change in her sense of direction.  Patient utilizes calendar assist and writing things down to keep up with appointments.  Patient does have a lot of orthopedic  issues and pain.  The patient's daughter reports that most of the symptoms were present prior to her moved to assisted living.  Patient's daughter reports that she first began noticing difficulties with the patient around the time the COVID pandemic started associated with significant loss in social interactions.  The patient's daughter was diagnosed with cancer, which was also stressful to the patient and the patient's husband's diagnosis of Alzheimer's and declining cognitive functioning are also very stressful.  The patient reports that if she is tired she will fall asleep after going to bed around 10 PM.  She reports that if she is thinking about something or stressed, that she may have more difficulty following a sleep or wake up in the middle of the night "thinking" and not able to return to sleep.  She describes a good appetite.   The patient had an MRI brain conducted on 09/18/2021 without any particular indications of acute process.  The only notable issue on the MRI was mild periventricular and subcortical foci nonspecific T2 hyperintensities that are likely simply age-related changes.  Treatment Interventions:  Feedback neuropsychological evaluation.  Participation Level:   Active  Participation Quality:  Appropriate      Impression/Diagnosis:                     (Complete neuropsychological evaluation can be found in her EMR dated 02/19/2022)  Overall, the results of the current neuropsychological evaluation do show consistencies between the patient and her daughter's reports of current difficulties including retrieval of recently learned/experienced information, some difficulty keeping up and organizing her schedule, and varying memory difficulties with sometimes better than others.  Retrieval of people's names are noted.  The patient does show some indications of weakening visual spatial and visual constructional abilities but a denial of any geographic disorientation or clear visual spatial  changes subjectively.  There are reports of difficulty with executive functioning to some degree in keeping up with ordered task or doing more than 1 task at a time or noted.  While the patient does have some tremors that are apparently attributed to essential tremor with family history of essential tremor there are no symptoms consistent with a Parkinson's-like condition.  The patient has had some falls recently which are concerning.  The patient is adapting to these memory difficulties by utilizing calendars and reminders and is doing so somewhat effectively.  Of note, is the fact that the patient has been having more symptoms of anxiety with significant stressors during the COVID pandemic changes including her husband and developing an apparent degenerative dementia, moving into an assisted living with the patient being the primary caregiver of her husband, and the patient's daughter being diagnosed with significant medical issues herself.   As far as diagnostic considerations, the pattern of subjective symptoms, information available through medical records and objective neuropsychological assessment are not particularly consistent with degenerative condition such as Alzheimer's dementia, Lewy body dementia or other progressive neurological disorders.  There are no clear indications of cortical or subcortical types of degenerative process.  The patient has been having some degree  of difficulty over the past several years and it does appear to be worsening to some degree.  The patient does show cognitive decline from predicted levels of premorbid functioning.  There are very limited indications of changes on MRI and those are generally consistent with age-related changes.  In this particular case, and will be necessary and essential for repeat testing to be conducted.  Anxiety and stress do appear to be playing a primary role here but we cannot completely rule out an Alzheimer's type condition.  We will need  repeat testing in 9 to 12 months for direct comparisons to her current level of performance to assess for progressive changes.  The patient would meet a diagnosis of mild neurocognitive changes and has indications of changes from premorbid functioning level and multiple cognitive domains.   As far as treatment recommendations, we should primarily focus on variables that we do have some control over.  Continuing to work on expectations and responsibilities the patient places on her self especially regarding care for her husband will be needed.  The patient must attend to her own wellbeing including working on good sleep hygiene and maintaining good sleep patterns, good dietary and nutritional patterns and maintaining an physically active life.  The patient has had multiple falls at this physical activity should be done in a safe but sustained way.  The patient is not currently taking any type of SSRI medication and this may be helpful addition to her medication strategies.  We will set up the patient for return neuropsychological assessment in approximately 9 months.   Diagnosis:                                Mild neurocognitive disorder due to multiple etiologies   Generalized anxiety disorder     _____________________ Ilean Skill, Psy.D. Clinical Neuropsychologist

## 2022-08-07 DIAGNOSIS — H8113 Benign paroxysmal vertigo, bilateral: Secondary | ICD-10-CM | POA: Diagnosis not present

## 2022-08-07 DIAGNOSIS — M542 Cervicalgia: Secondary | ICD-10-CM | POA: Diagnosis not present

## 2022-08-28 DIAGNOSIS — Z85828 Personal history of other malignant neoplasm of skin: Secondary | ICD-10-CM | POA: Diagnosis not present

## 2022-08-28 DIAGNOSIS — L820 Inflamed seborrheic keratosis: Secondary | ICD-10-CM | POA: Diagnosis not present

## 2022-08-28 DIAGNOSIS — L570 Actinic keratosis: Secondary | ICD-10-CM | POA: Diagnosis not present

## 2022-08-28 DIAGNOSIS — D485 Neoplasm of uncertain behavior of skin: Secondary | ICD-10-CM | POA: Diagnosis not present

## 2022-09-04 ENCOUNTER — Encounter: Payer: Self-pay | Admitting: Neurology

## 2022-09-11 ENCOUNTER — Ambulatory Visit (INDEPENDENT_AMBULATORY_CARE_PROVIDER_SITE_OTHER): Payer: Medicare Other | Admitting: Neurology

## 2022-09-11 VITALS — BP 129/74 | HR 79 | Ht 66.0 in | Wt 106.4 lb

## 2022-09-11 DIAGNOSIS — R269 Unspecified abnormalities of gait and mobility: Secondary | ICD-10-CM | POA: Diagnosis not present

## 2022-09-11 DIAGNOSIS — R4189 Other symptoms and signs involving cognitive functions and awareness: Secondary | ICD-10-CM | POA: Diagnosis not present

## 2022-09-11 DIAGNOSIS — F419 Anxiety disorder, unspecified: Secondary | ICD-10-CM

## 2022-09-11 DIAGNOSIS — F439 Reaction to severe stress, unspecified: Secondary | ICD-10-CM | POA: Diagnosis not present

## 2022-09-11 NOTE — Patient Instructions (Addendum)
- Can follow clinically every 6 months - Suggest addressing anxiety - We do have additional testing, at this time Dr. Ferne Coe testing did not indicate Alzheimer's but cannot rule it out. We do have other testing now available such as blood work for alzheimer's gene and alzheimer's markers in the bloos and CT scan for amyloid in the brain (protein that is involved in. There is a new medication on the mark Lecanemab for Alzheimer's disease - Medications we can consider include: Aricept/Donepezil to slow down memory loss. Also medications for anxiety talk to [facility].  - order physical therapy at [facility] for gait and balance  - XX Brain Lattie Haw Mosconi - MIND Diet from [facility]  Recommendations to prevent or slow progression of cognitive decline:   Exercise You should increase exercise 30 to 45 minutes per day at least 3 days a week although 5 to 7 would be preferred. Any type of exercise (including walking) is acceptable although a recumbent bicycle may be best if you are unsteady. Disease related apathy can be a significant roadblock to exercise and the only way to overcome this is to make it a daily routine and perhaps have a reward at the end (something your loved one loves to eat or drink perhaps) or a personal trainer coming to the home can also be very useful. In general a structured, repetitive schedule is best.   Cardiovascular Health: You should optimize all cardiovascular risk factors (blood pressure, sugar, cholesterol) as vascular disease such as strokes and heart attacks can make memory problems much worse.   Diet: Eating a heart healthy (Mediterranean) diet is also a good idea; fish and poultry instead of red meat, nuts (mostly non-peanuts), vegetables, fruits, olive oil or canola oil (instead of butter), minimal salt (use other spices to flavor foods), whole grain rice, bread, cereal and pasta and wine in moderation.  General Health: Any diseases which effect your body  will effect your brain such as a pneumonia, urinary infection, blood clot, heart attack or stroke. Keep contact with your primary care doctor for regular follow ups.  Sleep. A good nights sleep is healthy for the brain. Seven hours is recommended. If you have insomnia or poor sleep habits see the recommendations below  Tips: Structured and consistent daytime and nighttime routine, including regular wake times, bedtimes, and mealtimes, will be important for the patient to avoid confusion. Keeping frequently used items in designated places will help reduce stress from searching. If there are worries about getting lost do not let the patient leave home unaccompanied. They might benefit from wearing an identification bracelet that will help others assist in finding home if they become lost. Information about nationwide safe return services and other helpful resources may be obtained through the Alzheimer's Association helpline at [phone])[phone].  Finances, Power of Producer, television/film/video Directives: You should consider putting legal safeguards in place with regard to financial and medical decision making. While the spouse always has power of attorney for medical and financial issues in the absence of any form, you should consider what you want in case the spouse / caregiver is no longer around or capable of making decisions.   [place] : http://www.welch.com/.pdf  Or Google "[place]" AND "An Forensic scientist for [employer]  Other States: ApartmentMom.com.ee  The signature on these forms should be notarized.   DRIVING:   Driving only during the day Drive only to familiar Locations Avoid driving during bad weather  If you would like to be tested to  see if you are driving safely, [facility] has a Clinical Driving Evaluation. To schedule an appointment call  [phone].                RESOURCES:  Memory Loss: Improve your short term memory By Silvio Pate  The Alzheimer's Reading Room http://www.alzheimersreadingroom.com/   The Alzheimer's Compendium http://www.alzcompend.info/  [employer] www.dukefamilysupport.Y696352 8173194653  Recommended resources for caregivers (All can be purchased on [employer]):  1) A Caregiver's Guide to Dementia: Using Activities and Other Strategies to Prevent, Reduce and Manage Behavioral Symptoms by Osie Bond. Tyler Aas and [employer]   2) A Caregiver's Guide to [employer] Dementia by Caleen Essex MS BSN and Gaston Islam   3) What If It's Not Alzheimer's?: A Caregiver's Guide to Dementia by Koren Shiver (Author), Octaviano Batty (Editor)  3) The 36 hour day by Rabins and Mace  4) Understanding Difficult Behaviors by Merita Norton and White  Online course for helping caregivers reduce stress, guilt and frustration called the Caregivers Helpbook. The website is www.powerfultoolsforcaregivers.org  As a caregiver you are a Art gallery manager. Problems you face as a caregiver are usually unique to your situation and the way your loved-one's disease manifests itself. The best way to use these books is to look at the Table of Contents and read any chapters of interest or that apply to challenges you are having as a caregiver.  NATIONAL RESOURCES: For more information on neurological disorders or research programs funded by the [employer] of Neurological Disorders and Stroke, contact the Institute's Agricultural consultant (BRAIN) at: [place] [address] [phone] (toll-free) MasterBoxes.it  Information on dementia is also available from the following organizations: Alzheimer's Disease Education and Referral ([place]) [place] on Aging [address] [place] [address] [phone]  (toll-free) DVDEnthusiasts.nl  Alzheimer's Association [address], [place] [address] [phone] (toll-free, 24-hour helpline) [phone] (TDD) CapitalMile.co.nz  Alzheimer's Foundation of America [address], [place] [address] [phone] (toll-free) www.alzfdn.org  [facility] [address], [place] [address] [phone] www.alzdiscovery.org  Association for [place] [address], [place] [address] ([phone] (toll-free) www[place] [address] 318-067-4417 (toll-free) www.brightfocus.org/alzheimers  Doran Stabler [facility] Alzheimer's Foundation [address] [place] [address] [phone] www.https://lambert-jackson.net/  Lewy Body Dementia Association [address], [place] [address] [phone] (toll-free LBD Caregiver Link) www.lbda.[place], [place], [place] [phone] (toll-free) [phone] [facility] https://carter.com/  National Organization for Rare Disorders [address] [place] [address] [id number] [phone]) (toll-free) www.rarediseases.org  The Dementias: Hope Through Research was jointly produced by the [employer] of Neurological Disorders and Stroke (NINDS) and the [employer] on Aging (NIA), both part of the [employer], the [employer] research agency--supporting scientific studies that turn discovery into health. NINDS is the nation's leading funder of research on the brain and nervous system. The NINDS mission is to reduce the burden of neurological disease. For more information and resources, visit MasterBoxes.it [1] or call  ([phone])862-5412. NIA leads the federal government effort conducting and supporting research on aging and the health and well-being of older people. NIA's Alzheimer's Disease Education and Referral (ADEAR) Center offers information and publications on dementia and caregiving for families, caregivers, and professionals.  For more information, visit DVDEnthusiasts.nl [2] or call ([phone]. Also available from NIA are publications and information about Alzheimer's disease as well as the booklets Frontotemporal Disorders: Information for Patients, Families, and Caregivers and Lewy Body Dementia: Information for Patients, Families, and Professionals. Source URL: SocialSpecialists.co.nz   Donepezil Tablets What is this medication? DONEPEZIL (doe NEP e zil) treats memory loss and confusion (dementia) in people who have Alzheimer disease. It works by improving attention, memory, and the ability to engage in daily activities. It is not a cure for dementia or Alzheimer disease. This medicine may be used for other purposes; ask your health care provider or pharmacist if you have questions. COMMON BRAND NAME(S): Aricept What should I tell my care team before I take this medication? They need to know if you have any of these conditions: Asthma or other lung disease Difficulty passing urine Head injury Heart disease History of irregular heartbeat Liver disease Seizures (convulsions) Stomach or intestinal disease, ulcers or stomach bleeding An unusual or allergic reaction to donepezil, other medications, foods, dyes, or preservatives Pregnant or trying to get pregnant Breast-feeding How should I use this medication? Take this medication by mouth with a glass of water. Follow the directions on the prescription label. You may take this medication with or without food. Take this medication at regular intervals. This medication is usually taken before  bedtime. Do not take it more often than directed. Continue to take your medication even if you feel better. Do not stop taking except on your care team's advice. If you are taking the 23 mg donepezil tablet, swallow it whole; do not cut, crush, or chew it. Talk to your care team about the use of this medication in children. Special care may be needed. Overdosage: If you think you have taken too much of this medicine contact a poison control center or emergency room at once. NOTE: This medicine is only for you. Do not share this medicine with others. What if I miss a dose? If you miss a dose, take it as soon as you can. If it is almost time for your next dose, take only that dose, do not take double or extra doses. What may interact with this medication? Do not take this medication with any of the following: Certain medications for fungal infections like itraconazole, fluconazole, posaconazole, and voriconazole Cisapride Dextromethorphan; quinidine Dronedarone Pimozide Quinidine Thioridazine This medication may also interact with the following: Antihistamines for allergy, cough and cold Atropine Bethanechol Carbamazepine Certain medications for bladder problems like oxybutynin, tolterodine Certain medications for Parkinson's disease like benztropine, trihexyphenidyl Certain medications for stomach problems like dicyclomine, hyoscyamine Certain medications for travel sickness like scopolamine Dexamethasone Dofetilide Ipratropium NSAIDs, medications for pain and inflammation, like ibuprofen or naproxen Other medications for Alzheimer's disease Other medications that prolong the QT interval (cause an abnormal heart rhythm) Phenobarbital Phenytoin Rifampin, rifabutin or rifapentine Ziprasidone This list may not describe all possible interactions. Give your health care provider a list of all the medicines, herbs, non-prescription drugs, or dietary supplements you use. Also tell them if  you smoke, drink alcohol, or use illegal drugs. Some items may interact with your medicine. What should I watch for while using this medication? Visit your care team for regular checks on your progress. Check with your care team if your symptoms do not get better or if they get worse. You may get drowsy or dizzy. Do not drive, use machinery, or do anything that needs mental alertness until you know how this medication  affects you. What side effects may I notice from receiving this medication? Side effects that you should report to your care team as soon as possible: Allergic reactions--skin rash, itching, hives, swelling of the face, lips, tongue, or throat Peptic ulcer--burning stomach pain, loss of appetite, bloating, burping, heartburn, nausea, vomiting Seizures Slow heartbeat--dizziness, feeling faint or lightheaded, confusion, trouble breathing, unusual weakness or fatigue Stomach bleeding--bloody or black, tar-like stools, vomiting blood or brown material that looks like coffee grounds Trouble passing urine Side effects that usually do not require medical attention (report these to your care team if they continue or are bothersome): Diarrhea Fatigue Loss of appetite Muscle pain or cramps Nausea Trouble sleeping This list may not describe all possible side effects. Call your doctor for medical advice about side effects. You may report side effects to FDA at 1-800-FDA-1088. Where should I keep my medication? Keep out of reach of children. Store at room temperature between 15 and 30 degrees C (59 and 86 degrees F). Throw away any unused medication after the expiration date. NOTE: This sheet is a summary. It may not cover all possible information. If you have questions about this medicine, talk to your doctor, pharmacist, or health care provider.  2023 Elsevier/Gold Standard (2020-12-21 00:00:00)

## 2022-09-11 NOTE — Progress Notes (Signed)
[facility]    Provider:  Dr Jaynee Eagles Requesting Provider: Radene Ou Bill Salinas, MD Primary Care Provider:  Aretta Nip, MD  CC:  Memory loss  09/11/2022: family here and provides much information we reviewed testing and options as below:  - Can follow clinically every 6 months - Suggest addressing anxiety - We do have additional testing, at this time Dr. Ferne Coe testing did not indicate Alzheimer's but cannot rule it out. We do have other testing now available such as blood work for alzheimer's gene and alzheimer's markers in the bloos and CT scan for amyloid in the brain (protein that is involved in. There is a new medication on the mark Lecanemab for Alzheimer's disease - Medications we can consider include: Aricept/Donepezil to slow down memory loss. Also medications for anxiety talk to [facility].  - order physical therapy at [facility] for gait and balance  - XX Brain Eden Emms - MIND Diet from [facility]    09/20/2021:  IMPRESSION:    MRI brain (with and without) demonstrating: - Mild periventricular and subcortical foci nonspecific gliosis.  - No acute findings.  Overall, the results of the current neuropsychological evaluation do show consistencies between the patient and her daughter's reports of current difficulties including retrieval of recently learned/experienced information, some difficulty keeping up and organizing her schedule, and varying memory difficulties with sometimes better than others.  Retrieval of people's names are noted.  The patient does show some indications of weakening visual spatial and visual constructional abilities but a denial of any geographic disorientation or clear visual spatial changes subjectively.  There are reports of difficulty with executive functioning to some degree in keeping up with ordered task or doing more than 1 task at a time or noted.  While the patient does have some tremors that are  apparently attributed to essential tremor with family history of essential tremor there are no symptoms consistent with a Parkinson's-like condition.  The patient has had some falls recently which are concerning.  The patient is adapting to these memory difficulties by utilizing calendars and reminders and is doing so somewhat effectively.  Of note, is the fact that the patient has been having more symptoms of anxiety with significant stressors during the COVID pandemic changes including her husband and developing an apparent degenerative dementia, moving into an assisted living with the patient being the primary caregiver of her husband, and the patient's daughter being diagnosed with significant medical issues herself.   As far as diagnostic considerations, the pattern of subjective symptoms, information available through medical records and objective neuropsychological assessment are not particularly consistent with degenerative condition such as Alzheimer's dementia, Lewy body dementia or other progressive neurological disorders.  There are no clear indications of cortical or subcortical types of degenerative process.  The patient has been having some degree of difficulty over the past several years and it does appear to be worsening to some degree.  The patient does show cognitive decline from predicted levels of premorbid functioning.  There are very limited indications of changes on MRI and those are generally consistent with age-related changes.  In this particular case, and will be necessary and essential for repeat testing to be conducted.  Anxiety and stress do appear to be playing a primary role here but we cannot completely rule out an Alzheimer's type condition.  We will need repeat testing in 9 to 12 months for direct comparisons to her current level of performance to assess for progressive changes.  The patient  would meet a diagnosis of mild neurocognitive changes and has indications of changes  from premorbid functioning level and multiple cognitive domains.   As far as treatment recommendations, we should primarily focus on variables that we do have some control over.  Continuing to work on expectations and responsibilities the patient places on her self especially regarding care for her husband will be needed.  The patient must attend to her own wellbeing including working on good sleep hygiene and maintaining good sleep patterns, good dietary and nutritional patterns and maintaining an physically active life.  The patient has had multiple falls at this physical activity should be done in a safe but sustained way.  The patient is not currently taking any type of SSRI medication and this may be helpful addition to her medication strategies.  We will set up the patient for return neuropsychological assessment in approximately 9 months.   Diagnosis:                                Mild neurocognitive disorder due to multiple etiologies   Generalized anxiety disorder  HPI:  Marie Coleman is a 82 y.o. female here as requested by Rankins, Bill Salinas, MD for memory loss. PMHx arthritis, osteoporosis and arthritis, protein calorie malnutrition, anxiety, BPPV, fecl incontinence, essential tremor. She is here for memory evaluation. I reviewed Dr. Radene Ou' notes: Patient has been feeling depressed and anxious recently, was stressful to move into [facility] independent living recently, husband has Alzheimer's and her daughter has been diagnosed with cancer, husband lives with patient in new apartment, patient is a primary caregiver for her husband, she has been feeling stress when her plans for the day are changed or interrupted, they discussed anxiety medication, daughter accompanies patient and reported that she does not eat much, she is thin but her weight is stable, she was sent to [facility] neurologic for cognitive evaluation, she saw my colleague Dr. Rexene Alberts in the past for tremor, patient and daughter  requested to me because I have seen patient's husband for Alzheimer's.  She is at [employer]. She is his main caregiver of her husband wit Alzheimer's. Husband is benefitting from [place] socially, independent living, they just moved in a few months ago, there was a lot of stress, it was a huge change.Moving was difficult for her. Here with son [name]. 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 Early on she would remember, but now her mind goes blank, both people she just met and people she has known. She still pays the bills but it is hard, because of the tremor writing slows her down, she has a financial person helping her [name], patient pays her own credit card and the club bills and is independent. Not missing or double paying. She is taking her medication and helps her husband take his medication. She is still driving, not getting lost, still has her sense of direction. She doesn't repeat things in the same day, she uses a calendar and has good compensation skills, writing things down, keeping notes. Son provides information, there have been 1-2 occassions she has forgotten why she is going to an appointment very rarely but knew about the appointment. She has a lot of orthopaedic issues and lots of doctors to keep track of. Son says the move was very stressful  and had a negative impact. Husband is a good [employer] cabin crew. She says people are nice and accommodating and sounds like it was a good move but it was overwhelming. Her problem solving is impaired a little bit, logistics on who is driving with who.Mother had dementia but she was 26, showing symptoms at the age of 52. She also had an Aunt on her father's side with dementia. Mother could not recognize people in the end.   Reviewed notes, labs and imaging from outside physicians, which showed:  CT head 2017: FINDINGS: reviewed image and agree CT  HEAD FINDINGS   Negative for acute intracranial hemorrhage, acute infarction, mass, mass effect, hydrocephalus or midline shift. Gray-white differentiation is preserved throughout. No focal soft tissue or calvarial abnormality. Globes and orbits are symmetric and unremarkable bilaterally. Normal aeration of the mastoid air cells and visualized paranasal sinuses.  Review of Systems: Patient complains of symptoms per HPI as well as the following symptoms memory loss. Pertinent negatives and positives per HPI. All others negative.   Social History   Socioeconomic History   Marital status: Married    Spouse name: Not on file   Number of children: 1   Years of education: Not on file   Highest education level: Not on file  Occupational History   Not on file  Tobacco Use   Smoking status: Never   Smokeless tobacco: Never  Vaping Use   Vaping Use: Never used  Substance and Sexual Activity   Alcohol use: Yes    Comment: OCCAS mixed drink   Drug use: No   Sexual activity: Not on file  Other Topics Concern   Not on file  Social History Narrative   Caffeine [employer]   Social Determinants of Health   Financial Resource Strain: Not on file  Food Insecurity: Not on file  Transportation Needs: Not on file  Physical Activity: Not on file  Stress: Not on file  Social Connections: Not on file  Intimate Partner Violence: Not on file    Family History  Problem Relation Age of Onset   Dementia Mother    Heart attack Father    Tremor Neg Hx     Past Medical History:  Diagnosis Date   Arthritis    OA AND PAIN LEFT KNEE AND PAIN IN SHOULDERS, HANDS, LOWER BACK   BPV (benign positional vertigo)    Osteoporosis    Seasonal allergies    Tremor    Vertigo     Patient Active Problem List   Diagnosis Date Noted   Cognitive decline 08/27/2021   Avascular necrosis of bone of hip 04/20/2012   Postop Transfusion  09/26/2011   Postop Hyponatremia 09/26/2011   Postop Acute blood loss  anemia 09/25/2011   OA (osteoarthritis) of knee 09/23/2011   Hip fracture, right 05/14/2011    Past Surgical History:  Procedure Laterality Date   ABDOMINAL HYSTERECTOMY  06/18/1975   BACK SURGERY     CATARACT EXTRACTION, BILATERAL Bilateral 2019   HIP PINNING,CANNULATED  05/15/2011   Procedure: CANNULATED HIP PINNING;  Surgeon: Tobi Bastos;  Location: [facility];  Service: Orthopedics;  Laterality: Right;  Cannulated Right Hip Pinning   (c-arm)    LEFT ROTATOR CUFF REPAIR JAN 2012     LUMBAR SURGERY FOR HNP 1990'S     ORIF LEFT WRIST  1990'S     ROTATOR CUFF REPAIR Right 08/2012   SURGERY FOR NECK SPURS 1980'S     TOTAL HIP REVISION  04/20/2012  Procedure: TOTAL HIP REVISION;  Surgeon: Gearlean Alf, MD;  Location: [facility];  Service: Orthopedics;  Laterality: Right;  CONVERSION OF  PREVIOUS HIP SURGERY   TOTAL KNEE ARTHROPLASTY  09/23/2011   Procedure: TOTAL KNEE ARTHROPLASTY;  Surgeon: Gearlean Alf, MD;  Location: [facility];  Service: Orthopedics;  Laterality: Left;    Current Outpatient Medications  Medication Sig Dispense Refill   acetaminophen (TYLENOL) 500 MG tablet Take 500 mg by mouth daily as needed for moderate pain or headache. Reported on 06/25/2015     aspirin 81 MG chewable tablet Chew 81 mg by mouth daily.     atenolol (TENORMIN) 25 MG tablet Take 25 mg by mouth daily.     calcium carbonate (TUMS - DOSED IN MG ELEMENTAL CALCIUM) 500 MG chewable tablet Chew 1 tablet by mouth daily as needed for indigestion or heartburn.     Calcium-Vitamin A-Vitamin D (LIQUID CALCIUM PO) Take 15 mLs by mouth daily.     Multiple Vitamin (MULTIVITAMIN WITH MINERALS) TABS tablet Take 1 tablet by mouth daily.     omeprazole (PRILOSEC) 20 MG capsule TAKE 1 CAPSULE BY MOUTH AT NIGHT AS DIRECTED for 90 days     sodium chloride (OCEAN) 0.65 % nasal spray Place 1 spray into the nose as needed. Allergies     No current facility-administered medications for this visit.    Allergies as of  09/11/2022 - Review Complete 09/11/2022  Allergen Reaction Noted   Sulfa antibiotics Hives 05/14/2011    Vitals: BP 129/74   Pulse 79   Ht 5\' 6"  (1.676 m)   Wt 106 lb 6.4 oz (48.3 kg)   BMI 17.17 kg/m  Last Weight:  Wt Readings from Last 1 Encounters:  09/11/22 106 lb 6.4 oz (48.3 kg)   Last Height:   Ht Readings from Last 1 Encounters:  09/11/22 5\' 6"  (1.676 m)     Physical exam: Exam: Gen: NAD, conversant, well nourised, well groomed                     CV: RRR, no MRG. No Carotid Bruits. No peripheral edema, warm, nontender Eyes: Conjunctivae clear without exudates or hemorrhage  Neuro: Detailed Neurologic Exam  Speech:    Speech is normal; fluent and spontaneous with normal comprehension.  Cognition:     09/11/2022    3:30 PM 08/23/2021   11:18 AM  MMSE - Mini Mental State Exam  Orientation to time 4 5  Orientation to Place 5 5  Registration 3 3  Attention/ Calculation 1 2  Recall 2 2  Language- name 2 objects 2 2  Language- repeat 1 1  Language- follow 3 step command 3 3  Language- read & follow direction 0 1  Write a sentence 0 1  Copy design 1 0  Total score 22 25       The patient is oriented to person, place, and time;     recent and remote memory intact;     language fluent;     normal attention, concentration,     fund of knowledge Cranial Nerves:    The pupils are equal, round, and reactive to light. Attempted, pupils too small to visualize. Visual fields are full to finger confrontation. Extraocular movements are intact. Trigeminal sensation is intact and the muscles of mastication are normal. The face is symmetric. The palate elevates in the midline. Hearing intact. Voice is normal. Shoulder shrug is normal. The tongue has normal motion without fasciculations.  Coordination:    Normal finger to nose and heel to shin. Normal rapid alternating movements.   Gait:    Heel-toe and tandem gait are normal.   Motor Observation:    No asymmetry,  no atrophy, and no involuntary movements noted. Tone:    Normal muscle tone.    Posture:    Posture is normal. normal erect    Strength:    Strength is V/V in the upper and lower limbs.      Sensation: intact to LT     Reflex Exam:  DTR's:    Deep tendon reflexes in the upper and lower extremities are symetrical  bilaterally.   Toes:    The toes are downgoing bilaterally.   Clonus:    Clonus is absent.    Assessment/Plan:  Lovely 82 y.o. female here as requested by Rankins, Bill Salinas, MD for memory loss. PMHx arthritis, osteoporosis and arthritis, protein calorie malnutrition, anxiety, BPPV, fecl incontinence, essential tremor. She is here for memory evaluation. Had a long conversation with family, reviewed MRi and formal neurocognitive testing, gave options:  - Can follow clinically every 6 months - Suggest addressing anxiety - We do have additional testing, at this time Dr. Ferne Coe testing did not indicate Alzheimer's but cannot rule it out. We do have other testing now available such as blood work for alzheimer's gene and alzheimer's markers in the bloos and CT scan for amyloid in the brain (protein that is involved in. There is a new medication on the mark Lecanemab for Alzheimer's disease - Medications we can consider include: Aricept/Donepezil to slow down memory loss. Also medications for anxiety talk to [facility].  - order physical therapy at [facility] for gait and balance  - XX Brain Lattie Haw Mosconi - MIND Diet from [facility] - family will consider  Orders Placed This Encounter  Procedures   Ambulatory referral to Physical Therapy    Encouraged her to seek therapy and psychiatry for her depression and stress   Cc: Rankins, Bill Salinas, MD,  Rankins, Bill Salinas, MD  Sarina Ill, MD  [facility] [address] [place], [place] [address]  Phone [phone] Fax [phone]  I spent over 50 minutes of face-to-face  and non-face-to-face time with patient on the  1. Cognitive decline   2. Anxiety   3. Stress   4. Gait abnormality     diagnosis.  This included previsit chart review, lab review, study review, order entry, electronic health record documentation, patient education on the different diagnostic and therapeutic options, counseling and coordination of care, risks and benefits of management, compliance, or risk factor reduction

## 2022-09-16 ENCOUNTER — Telehealth: Payer: Self-pay | Admitting: Neurology

## 2022-09-16 NOTE — Telephone Encounter (Signed)
PT orders sent to [facility], phone # [phone].

## 2022-09-30 DIAGNOSIS — F339 Major depressive disorder, recurrent, unspecified: Secondary | ICD-10-CM | POA: Diagnosis not present

## 2022-10-01 DIAGNOSIS — M810 Age-related osteoporosis without current pathological fracture: Secondary | ICD-10-CM | POA: Diagnosis not present

## 2022-10-01 DIAGNOSIS — R109 Unspecified abdominal pain: Secondary | ICD-10-CM | POA: Diagnosis not present

## 2022-10-01 DIAGNOSIS — R2689 Other abnormalities of gait and mobility: Secondary | ICD-10-CM | POA: Diagnosis not present

## 2022-10-10 DIAGNOSIS — F419 Anxiety disorder, unspecified: Secondary | ICD-10-CM | POA: Diagnosis not present

## 2022-10-10 DIAGNOSIS — F339 Major depressive disorder, recurrent, unspecified: Secondary | ICD-10-CM | POA: Diagnosis not present

## 2022-10-16 DIAGNOSIS — R260 Ataxic gait: Secondary | ICD-10-CM | POA: Diagnosis not present

## 2022-10-16 DIAGNOSIS — R262 Difficulty in walking, not elsewhere classified: Secondary | ICD-10-CM | POA: Diagnosis not present

## 2022-10-16 DIAGNOSIS — M6281 Muscle weakness (generalized): Secondary | ICD-10-CM | POA: Diagnosis not present

## 2022-10-18 DIAGNOSIS — R262 Difficulty in walking, not elsewhere classified: Secondary | ICD-10-CM | POA: Diagnosis not present

## 2022-10-18 DIAGNOSIS — R260 Ataxic gait: Secondary | ICD-10-CM | POA: Diagnosis not present

## 2022-10-18 DIAGNOSIS — M6281 Muscle weakness (generalized): Secondary | ICD-10-CM | POA: Diagnosis not present

## 2022-10-21 DIAGNOSIS — F419 Anxiety disorder, unspecified: Secondary | ICD-10-CM | POA: Diagnosis not present

## 2022-10-21 DIAGNOSIS — F339 Major depressive disorder, recurrent, unspecified: Secondary | ICD-10-CM | POA: Diagnosis not present

## 2022-10-23 DIAGNOSIS — R262 Difficulty in walking, not elsewhere classified: Secondary | ICD-10-CM | POA: Diagnosis not present

## 2022-10-23 DIAGNOSIS — M6281 Muscle weakness (generalized): Secondary | ICD-10-CM | POA: Diagnosis not present

## 2022-10-23 DIAGNOSIS — R260 Ataxic gait: Secondary | ICD-10-CM | POA: Diagnosis not present

## 2022-10-25 DIAGNOSIS — R260 Ataxic gait: Secondary | ICD-10-CM | POA: Diagnosis not present

## 2022-10-25 DIAGNOSIS — M6281 Muscle weakness (generalized): Secondary | ICD-10-CM | POA: Diagnosis not present

## 2022-10-25 DIAGNOSIS — R262 Difficulty in walking, not elsewhere classified: Secondary | ICD-10-CM | POA: Diagnosis not present

## 2022-10-28 DIAGNOSIS — M6281 Muscle weakness (generalized): Secondary | ICD-10-CM | POA: Diagnosis not present

## 2022-10-28 DIAGNOSIS — R262 Difficulty in walking, not elsewhere classified: Secondary | ICD-10-CM | POA: Diagnosis not present

## 2022-10-28 DIAGNOSIS — R260 Ataxic gait: Secondary | ICD-10-CM | POA: Diagnosis not present

## 2022-10-30 DIAGNOSIS — E46 Unspecified protein-calorie malnutrition: Secondary | ICD-10-CM | POA: Diagnosis not present

## 2022-10-30 DIAGNOSIS — R109 Unspecified abdominal pain: Secondary | ICD-10-CM | POA: Diagnosis not present

## 2022-10-30 DIAGNOSIS — K5901 Slow transit constipation: Secondary | ICD-10-CM | POA: Diagnosis not present

## 2022-10-31 DIAGNOSIS — R260 Ataxic gait: Secondary | ICD-10-CM | POA: Diagnosis not present

## 2022-10-31 DIAGNOSIS — M6281 Muscle weakness (generalized): Secondary | ICD-10-CM | POA: Diagnosis not present

## 2022-10-31 DIAGNOSIS — R262 Difficulty in walking, not elsewhere classified: Secondary | ICD-10-CM | POA: Diagnosis not present

## 2022-11-04 DIAGNOSIS — R260 Ataxic gait: Secondary | ICD-10-CM | POA: Diagnosis not present

## 2022-11-04 DIAGNOSIS — M6281 Muscle weakness (generalized): Secondary | ICD-10-CM | POA: Diagnosis not present

## 2022-11-04 DIAGNOSIS — R262 Difficulty in walking, not elsewhere classified: Secondary | ICD-10-CM | POA: Diagnosis not present

## 2022-11-06 DIAGNOSIS — F339 Major depressive disorder, recurrent, unspecified: Secondary | ICD-10-CM | POA: Diagnosis not present

## 2022-11-06 DIAGNOSIS — G3184 Mild cognitive impairment, so stated: Secondary | ICD-10-CM | POA: Diagnosis not present

## 2022-11-06 DIAGNOSIS — F419 Anxiety disorder, unspecified: Secondary | ICD-10-CM | POA: Diagnosis not present

## 2022-11-06 DIAGNOSIS — R262 Difficulty in walking, not elsewhere classified: Secondary | ICD-10-CM | POA: Diagnosis not present

## 2022-11-06 DIAGNOSIS — Z79899 Other long term (current) drug therapy: Secondary | ICD-10-CM | POA: Diagnosis not present

## 2022-11-06 DIAGNOSIS — R260 Ataxic gait: Secondary | ICD-10-CM | POA: Diagnosis not present

## 2022-11-06 DIAGNOSIS — G4700 Insomnia, unspecified: Secondary | ICD-10-CM | POA: Diagnosis not present

## 2022-11-06 DIAGNOSIS — M6281 Muscle weakness (generalized): Secondary | ICD-10-CM | POA: Diagnosis not present

## 2022-11-12 DIAGNOSIS — R260 Ataxic gait: Secondary | ICD-10-CM | POA: Diagnosis not present

## 2022-11-12 DIAGNOSIS — M6281 Muscle weakness (generalized): Secondary | ICD-10-CM | POA: Diagnosis not present

## 2022-11-12 DIAGNOSIS — R262 Difficulty in walking, not elsewhere classified: Secondary | ICD-10-CM | POA: Diagnosis not present

## 2022-11-13 DIAGNOSIS — G4700 Insomnia, unspecified: Secondary | ICD-10-CM | POA: Diagnosis not present

## 2022-11-13 DIAGNOSIS — G3184 Mild cognitive impairment, so stated: Secondary | ICD-10-CM | POA: Diagnosis not present

## 2022-11-13 DIAGNOSIS — F419 Anxiety disorder, unspecified: Secondary | ICD-10-CM | POA: Diagnosis not present

## 2022-11-13 DIAGNOSIS — F339 Major depressive disorder, recurrent, unspecified: Secondary | ICD-10-CM | POA: Diagnosis not present

## 2022-11-18 DIAGNOSIS — R260 Ataxic gait: Secondary | ICD-10-CM | POA: Diagnosis not present

## 2022-11-18 DIAGNOSIS — R262 Difficulty in walking, not elsewhere classified: Secondary | ICD-10-CM | POA: Diagnosis not present

## 2022-11-18 DIAGNOSIS — M6281 Muscle weakness (generalized): Secondary | ICD-10-CM | POA: Diagnosis not present

## 2022-11-20 DIAGNOSIS — R260 Ataxic gait: Secondary | ICD-10-CM | POA: Diagnosis not present

## 2022-11-20 DIAGNOSIS — F339 Major depressive disorder, recurrent, unspecified: Secondary | ICD-10-CM | POA: Diagnosis not present

## 2022-11-20 DIAGNOSIS — F419 Anxiety disorder, unspecified: Secondary | ICD-10-CM | POA: Diagnosis not present

## 2022-11-20 DIAGNOSIS — M6281 Muscle weakness (generalized): Secondary | ICD-10-CM | POA: Diagnosis not present

## 2022-11-20 DIAGNOSIS — R262 Difficulty in walking, not elsewhere classified: Secondary | ICD-10-CM | POA: Diagnosis not present

## 2022-11-26 DIAGNOSIS — R2689 Other abnormalities of gait and mobility: Secondary | ICD-10-CM | POA: Diagnosis not present

## 2022-11-26 DIAGNOSIS — E46 Unspecified protein-calorie malnutrition: Secondary | ICD-10-CM | POA: Diagnosis not present

## 2022-11-27 DIAGNOSIS — R260 Ataxic gait: Secondary | ICD-10-CM | POA: Diagnosis not present

## 2022-11-27 DIAGNOSIS — M6281 Muscle weakness (generalized): Secondary | ICD-10-CM | POA: Diagnosis not present

## 2022-11-27 DIAGNOSIS — R262 Difficulty in walking, not elsewhere classified: Secondary | ICD-10-CM | POA: Diagnosis not present

## 2022-11-28 DIAGNOSIS — R262 Difficulty in walking, not elsewhere classified: Secondary | ICD-10-CM | POA: Diagnosis not present

## 2022-11-28 DIAGNOSIS — R260 Ataxic gait: Secondary | ICD-10-CM | POA: Diagnosis not present

## 2022-11-28 DIAGNOSIS — M6281 Muscle weakness (generalized): Secondary | ICD-10-CM | POA: Diagnosis not present

## 2022-12-02 DIAGNOSIS — R260 Ataxic gait: Secondary | ICD-10-CM | POA: Diagnosis not present

## 2022-12-02 DIAGNOSIS — R262 Difficulty in walking, not elsewhere classified: Secondary | ICD-10-CM | POA: Diagnosis not present

## 2022-12-02 DIAGNOSIS — M6281 Muscle weakness (generalized): Secondary | ICD-10-CM | POA: Diagnosis not present

## 2022-12-04 DIAGNOSIS — M6281 Muscle weakness (generalized): Secondary | ICD-10-CM | POA: Diagnosis not present

## 2022-12-04 DIAGNOSIS — R260 Ataxic gait: Secondary | ICD-10-CM | POA: Diagnosis not present

## 2022-12-04 DIAGNOSIS — R262 Difficulty in walking, not elsewhere classified: Secondary | ICD-10-CM | POA: Diagnosis not present

## 2022-12-06 DIAGNOSIS — R109 Unspecified abdominal pain: Secondary | ICD-10-CM | POA: Diagnosis not present

## 2022-12-06 DIAGNOSIS — K5900 Constipation, unspecified: Secondary | ICD-10-CM | POA: Diagnosis not present

## 2022-12-09 DIAGNOSIS — R262 Difficulty in walking, not elsewhere classified: Secondary | ICD-10-CM | POA: Diagnosis not present

## 2022-12-09 DIAGNOSIS — R260 Ataxic gait: Secondary | ICD-10-CM | POA: Diagnosis not present

## 2022-12-09 DIAGNOSIS — M6281 Muscle weakness (generalized): Secondary | ICD-10-CM | POA: Diagnosis not present

## 2022-12-11 DIAGNOSIS — R260 Ataxic gait: Secondary | ICD-10-CM | POA: Diagnosis not present

## 2022-12-11 DIAGNOSIS — M6281 Muscle weakness (generalized): Secondary | ICD-10-CM | POA: Diagnosis not present

## 2022-12-11 DIAGNOSIS — R262 Difficulty in walking, not elsewhere classified: Secondary | ICD-10-CM | POA: Diagnosis not present

## 2022-12-16 DIAGNOSIS — F419 Anxiety disorder, unspecified: Secondary | ICD-10-CM | POA: Diagnosis not present

## 2022-12-16 DIAGNOSIS — F339 Major depressive disorder, recurrent, unspecified: Secondary | ICD-10-CM | POA: Diagnosis not present

## 2022-12-16 DIAGNOSIS — R260 Ataxic gait: Secondary | ICD-10-CM | POA: Diagnosis not present

## 2022-12-16 DIAGNOSIS — R262 Difficulty in walking, not elsewhere classified: Secondary | ICD-10-CM | POA: Diagnosis not present

## 2022-12-16 DIAGNOSIS — M6281 Muscle weakness (generalized): Secondary | ICD-10-CM | POA: Diagnosis not present

## 2022-12-18 DIAGNOSIS — M6281 Muscle weakness (generalized): Secondary | ICD-10-CM | POA: Diagnosis not present

## 2022-12-18 DIAGNOSIS — R260 Ataxic gait: Secondary | ICD-10-CM | POA: Diagnosis not present

## 2022-12-18 DIAGNOSIS — R262 Difficulty in walking, not elsewhere classified: Secondary | ICD-10-CM | POA: Diagnosis not present

## 2022-12-23 DIAGNOSIS — R262 Difficulty in walking, not elsewhere classified: Secondary | ICD-10-CM | POA: Diagnosis not present

## 2022-12-23 DIAGNOSIS — M6281 Muscle weakness (generalized): Secondary | ICD-10-CM | POA: Diagnosis not present

## 2022-12-23 DIAGNOSIS — R260 Ataxic gait: Secondary | ICD-10-CM | POA: Diagnosis not present

## 2022-12-25 DIAGNOSIS — F339 Major depressive disorder, recurrent, unspecified: Secondary | ICD-10-CM | POA: Diagnosis not present

## 2022-12-25 DIAGNOSIS — R262 Difficulty in walking, not elsewhere classified: Secondary | ICD-10-CM | POA: Diagnosis not present

## 2022-12-25 DIAGNOSIS — M6281 Muscle weakness (generalized): Secondary | ICD-10-CM | POA: Diagnosis not present

## 2022-12-25 DIAGNOSIS — G4700 Insomnia, unspecified: Secondary | ICD-10-CM | POA: Diagnosis not present

## 2022-12-25 DIAGNOSIS — F419 Anxiety disorder, unspecified: Secondary | ICD-10-CM | POA: Diagnosis not present

## 2022-12-25 DIAGNOSIS — G3184 Mild cognitive impairment, so stated: Secondary | ICD-10-CM | POA: Diagnosis not present

## 2022-12-25 DIAGNOSIS — R260 Ataxic gait: Secondary | ICD-10-CM | POA: Diagnosis not present

## 2023-01-01 DIAGNOSIS — R262 Difficulty in walking, not elsewhere classified: Secondary | ICD-10-CM | POA: Diagnosis not present

## 2023-01-01 DIAGNOSIS — R260 Ataxic gait: Secondary | ICD-10-CM | POA: Diagnosis not present

## 2023-01-01 DIAGNOSIS — M6281 Muscle weakness (generalized): Secondary | ICD-10-CM | POA: Diagnosis not present

## 2023-01-06 DIAGNOSIS — R260 Ataxic gait: Secondary | ICD-10-CM | POA: Diagnosis not present

## 2023-01-06 DIAGNOSIS — R262 Difficulty in walking, not elsewhere classified: Secondary | ICD-10-CM | POA: Diagnosis not present

## 2023-01-06 DIAGNOSIS — M6281 Muscle weakness (generalized): Secondary | ICD-10-CM | POA: Diagnosis not present

## 2023-01-08 DIAGNOSIS — R262 Difficulty in walking, not elsewhere classified: Secondary | ICD-10-CM | POA: Diagnosis not present

## 2023-01-08 DIAGNOSIS — M6281 Muscle weakness (generalized): Secondary | ICD-10-CM | POA: Diagnosis not present

## 2023-01-08 DIAGNOSIS — R260 Ataxic gait: Secondary | ICD-10-CM | POA: Diagnosis not present

## 2023-01-13 DIAGNOSIS — M6281 Muscle weakness (generalized): Secondary | ICD-10-CM | POA: Diagnosis not present

## 2023-01-13 DIAGNOSIS — R260 Ataxic gait: Secondary | ICD-10-CM | POA: Diagnosis not present

## 2023-01-13 DIAGNOSIS — R262 Difficulty in walking, not elsewhere classified: Secondary | ICD-10-CM | POA: Diagnosis not present

## 2023-01-15 DIAGNOSIS — R262 Difficulty in walking, not elsewhere classified: Secondary | ICD-10-CM | POA: Diagnosis not present

## 2023-01-15 DIAGNOSIS — M6281 Muscle weakness (generalized): Secondary | ICD-10-CM | POA: Diagnosis not present

## 2023-01-15 DIAGNOSIS — R260 Ataxic gait: Secondary | ICD-10-CM | POA: Diagnosis not present

## 2023-01-20 DIAGNOSIS — E46 Unspecified protein-calorie malnutrition: Secondary | ICD-10-CM | POA: Diagnosis not present

## 2023-01-20 DIAGNOSIS — Z713 Dietary counseling and surveillance: Secondary | ICD-10-CM | POA: Diagnosis not present

## 2023-01-20 DIAGNOSIS — R260 Ataxic gait: Secondary | ICD-10-CM | POA: Diagnosis not present

## 2023-01-20 DIAGNOSIS — K5901 Slow transit constipation: Secondary | ICD-10-CM | POA: Diagnosis not present

## 2023-01-20 DIAGNOSIS — R262 Difficulty in walking, not elsewhere classified: Secondary | ICD-10-CM | POA: Diagnosis not present

## 2023-01-20 DIAGNOSIS — M6281 Muscle weakness (generalized): Secondary | ICD-10-CM | POA: Diagnosis not present

## 2023-01-21 DIAGNOSIS — G3184 Mild cognitive impairment, so stated: Secondary | ICD-10-CM | POA: Diagnosis not present

## 2023-01-21 DIAGNOSIS — G4700 Insomnia, unspecified: Secondary | ICD-10-CM | POA: Diagnosis not present

## 2023-01-21 DIAGNOSIS — F419 Anxiety disorder, unspecified: Secondary | ICD-10-CM | POA: Diagnosis not present

## 2023-01-21 DIAGNOSIS — F339 Major depressive disorder, recurrent, unspecified: Secondary | ICD-10-CM | POA: Diagnosis not present

## 2023-01-22 DIAGNOSIS — M6281 Muscle weakness (generalized): Secondary | ICD-10-CM | POA: Diagnosis not present

## 2023-01-22 DIAGNOSIS — R262 Difficulty in walking, not elsewhere classified: Secondary | ICD-10-CM | POA: Diagnosis not present

## 2023-01-22 DIAGNOSIS — R260 Ataxic gait: Secondary | ICD-10-CM | POA: Diagnosis not present

## 2023-01-27 DIAGNOSIS — M6281 Muscle weakness (generalized): Secondary | ICD-10-CM | POA: Diagnosis not present

## 2023-01-27 DIAGNOSIS — R262 Difficulty in walking, not elsewhere classified: Secondary | ICD-10-CM | POA: Diagnosis not present

## 2023-01-27 DIAGNOSIS — R260 Ataxic gait: Secondary | ICD-10-CM | POA: Diagnosis not present

## 2023-01-29 DIAGNOSIS — R260 Ataxic gait: Secondary | ICD-10-CM | POA: Diagnosis not present

## 2023-01-29 DIAGNOSIS — M6281 Muscle weakness (generalized): Secondary | ICD-10-CM | POA: Diagnosis not present

## 2023-01-29 DIAGNOSIS — R262 Difficulty in walking, not elsewhere classified: Secondary | ICD-10-CM | POA: Diagnosis not present

## 2023-02-03 DIAGNOSIS — R262 Difficulty in walking, not elsewhere classified: Secondary | ICD-10-CM | POA: Diagnosis not present

## 2023-02-03 DIAGNOSIS — M6281 Muscle weakness (generalized): Secondary | ICD-10-CM | POA: Diagnosis not present

## 2023-02-03 DIAGNOSIS — R260 Ataxic gait: Secondary | ICD-10-CM | POA: Diagnosis not present

## 2023-02-04 DIAGNOSIS — G4700 Insomnia, unspecified: Secondary | ICD-10-CM | POA: Diagnosis not present

## 2023-02-04 DIAGNOSIS — G3184 Mild cognitive impairment, so stated: Secondary | ICD-10-CM | POA: Diagnosis not present

## 2023-02-04 DIAGNOSIS — F419 Anxiety disorder, unspecified: Secondary | ICD-10-CM | POA: Diagnosis not present

## 2023-02-04 DIAGNOSIS — F339 Major depressive disorder, recurrent, unspecified: Secondary | ICD-10-CM | POA: Diagnosis not present

## 2023-02-06 DIAGNOSIS — M6281 Muscle weakness (generalized): Secondary | ICD-10-CM | POA: Diagnosis not present

## 2023-02-06 DIAGNOSIS — R260 Ataxic gait: Secondary | ICD-10-CM | POA: Diagnosis not present

## 2023-02-06 DIAGNOSIS — R262 Difficulty in walking, not elsewhere classified: Secondary | ICD-10-CM | POA: Diagnosis not present

## 2023-02-12 DIAGNOSIS — M6281 Muscle weakness (generalized): Secondary | ICD-10-CM | POA: Diagnosis not present

## 2023-02-12 DIAGNOSIS — R260 Ataxic gait: Secondary | ICD-10-CM | POA: Diagnosis not present

## 2023-02-12 DIAGNOSIS — R262 Difficulty in walking, not elsewhere classified: Secondary | ICD-10-CM | POA: Diagnosis not present

## 2023-02-14 DIAGNOSIS — M6281 Muscle weakness (generalized): Secondary | ICD-10-CM | POA: Diagnosis not present

## 2023-02-14 DIAGNOSIS — R262 Difficulty in walking, not elsewhere classified: Secondary | ICD-10-CM | POA: Diagnosis not present

## 2023-02-14 DIAGNOSIS — R260 Ataxic gait: Secondary | ICD-10-CM | POA: Diagnosis not present

## 2023-02-18 DIAGNOSIS — R262 Difficulty in walking, not elsewhere classified: Secondary | ICD-10-CM | POA: Diagnosis not present

## 2023-02-18 DIAGNOSIS — M6281 Muscle weakness (generalized): Secondary | ICD-10-CM | POA: Diagnosis not present

## 2023-02-18 DIAGNOSIS — R260 Ataxic gait: Secondary | ICD-10-CM | POA: Diagnosis not present

## 2023-02-19 DIAGNOSIS — M6281 Muscle weakness (generalized): Secondary | ICD-10-CM | POA: Diagnosis not present

## 2023-02-19 DIAGNOSIS — R262 Difficulty in walking, not elsewhere classified: Secondary | ICD-10-CM | POA: Diagnosis not present

## 2023-02-19 DIAGNOSIS — R260 Ataxic gait: Secondary | ICD-10-CM | POA: Diagnosis not present

## 2023-02-24 ENCOUNTER — Encounter: Payer: Medicare Other | Admitting: Psychology

## 2023-02-26 DIAGNOSIS — M6281 Muscle weakness (generalized): Secondary | ICD-10-CM | POA: Diagnosis not present

## 2023-02-26 DIAGNOSIS — R260 Ataxic gait: Secondary | ICD-10-CM | POA: Diagnosis not present

## 2023-02-26 DIAGNOSIS — R262 Difficulty in walking, not elsewhere classified: Secondary | ICD-10-CM | POA: Diagnosis not present

## 2023-02-28 DIAGNOSIS — R262 Difficulty in walking, not elsewhere classified: Secondary | ICD-10-CM | POA: Diagnosis not present

## 2023-02-28 DIAGNOSIS — R260 Ataxic gait: Secondary | ICD-10-CM | POA: Diagnosis not present

## 2023-02-28 DIAGNOSIS — M6281 Muscle weakness (generalized): Secondary | ICD-10-CM | POA: Diagnosis not present

## 2023-03-03 DIAGNOSIS — F419 Anxiety disorder, unspecified: Secondary | ICD-10-CM | POA: Diagnosis not present

## 2023-03-03 DIAGNOSIS — G4700 Insomnia, unspecified: Secondary | ICD-10-CM | POA: Diagnosis not present

## 2023-03-03 DIAGNOSIS — M6281 Muscle weakness (generalized): Secondary | ICD-10-CM | POA: Diagnosis not present

## 2023-03-03 DIAGNOSIS — F339 Major depressive disorder, recurrent, unspecified: Secondary | ICD-10-CM | POA: Diagnosis not present

## 2023-03-03 DIAGNOSIS — G3184 Mild cognitive impairment, so stated: Secondary | ICD-10-CM | POA: Diagnosis not present

## 2023-03-03 DIAGNOSIS — R262 Difficulty in walking, not elsewhere classified: Secondary | ICD-10-CM | POA: Diagnosis not present

## 2023-03-03 DIAGNOSIS — R260 Ataxic gait: Secondary | ICD-10-CM | POA: Diagnosis not present

## 2023-03-07 DIAGNOSIS — R260 Ataxic gait: Secondary | ICD-10-CM | POA: Diagnosis not present

## 2023-03-07 DIAGNOSIS — M6281 Muscle weakness (generalized): Secondary | ICD-10-CM | POA: Diagnosis not present

## 2023-03-07 DIAGNOSIS — R262 Difficulty in walking, not elsewhere classified: Secondary | ICD-10-CM | POA: Diagnosis not present

## 2023-03-10 DIAGNOSIS — R260 Ataxic gait: Secondary | ICD-10-CM | POA: Diagnosis not present

## 2023-03-10 DIAGNOSIS — M6281 Muscle weakness (generalized): Secondary | ICD-10-CM | POA: Diagnosis not present

## 2023-03-10 DIAGNOSIS — R262 Difficulty in walking, not elsewhere classified: Secondary | ICD-10-CM | POA: Diagnosis not present

## 2023-03-12 DIAGNOSIS — M6281 Muscle weakness (generalized): Secondary | ICD-10-CM | POA: Diagnosis not present

## 2023-03-12 DIAGNOSIS — R260 Ataxic gait: Secondary | ICD-10-CM | POA: Diagnosis not present

## 2023-03-12 DIAGNOSIS — R262 Difficulty in walking, not elsewhere classified: Secondary | ICD-10-CM | POA: Diagnosis not present

## 2023-03-13 ENCOUNTER — Encounter: Payer: Medicare Other | Admitting: Psychology

## 2023-03-13 DIAGNOSIS — H16223 Keratoconjunctivitis sicca, not specified as Sjogren's, bilateral: Secondary | ICD-10-CM | POA: Diagnosis not present

## 2023-03-13 DIAGNOSIS — Z961 Presence of intraocular lens: Secondary | ICD-10-CM | POA: Diagnosis not present

## 2023-03-17 DIAGNOSIS — R260 Ataxic gait: Secondary | ICD-10-CM | POA: Diagnosis not present

## 2023-03-17 DIAGNOSIS — R262 Difficulty in walking, not elsewhere classified: Secondary | ICD-10-CM | POA: Diagnosis not present

## 2023-03-17 DIAGNOSIS — M6281 Muscle weakness (generalized): Secondary | ICD-10-CM | POA: Diagnosis not present

## 2023-03-19 DIAGNOSIS — R260 Ataxic gait: Secondary | ICD-10-CM | POA: Diagnosis not present

## 2023-03-19 DIAGNOSIS — R262 Difficulty in walking, not elsewhere classified: Secondary | ICD-10-CM | POA: Diagnosis not present

## 2023-03-19 DIAGNOSIS — M6281 Muscle weakness (generalized): Secondary | ICD-10-CM | POA: Diagnosis not present

## 2023-03-20 DIAGNOSIS — Z23 Encounter for immunization: Secondary | ICD-10-CM | POA: Diagnosis not present

## 2023-03-24 DIAGNOSIS — M6281 Muscle weakness (generalized): Secondary | ICD-10-CM | POA: Diagnosis not present

## 2023-03-24 DIAGNOSIS — R260 Ataxic gait: Secondary | ICD-10-CM | POA: Diagnosis not present

## 2023-03-24 DIAGNOSIS — R262 Difficulty in walking, not elsewhere classified: Secondary | ICD-10-CM | POA: Diagnosis not present

## 2023-03-26 DIAGNOSIS — M6281 Muscle weakness (generalized): Secondary | ICD-10-CM | POA: Diagnosis not present

## 2023-03-26 DIAGNOSIS — R260 Ataxic gait: Secondary | ICD-10-CM | POA: Diagnosis not present

## 2023-03-26 DIAGNOSIS — R262 Difficulty in walking, not elsewhere classified: Secondary | ICD-10-CM | POA: Diagnosis not present

## 2023-03-31 DIAGNOSIS — R262 Difficulty in walking, not elsewhere classified: Secondary | ICD-10-CM | POA: Diagnosis not present

## 2023-03-31 DIAGNOSIS — R260 Ataxic gait: Secondary | ICD-10-CM | POA: Diagnosis not present

## 2023-03-31 DIAGNOSIS — M6281 Muscle weakness (generalized): Secondary | ICD-10-CM | POA: Diagnosis not present

## 2023-04-01 DIAGNOSIS — G4700 Insomnia, unspecified: Secondary | ICD-10-CM | POA: Diagnosis not present

## 2023-04-01 DIAGNOSIS — F339 Major depressive disorder, recurrent, unspecified: Secondary | ICD-10-CM | POA: Diagnosis not present

## 2023-04-01 DIAGNOSIS — F419 Anxiety disorder, unspecified: Secondary | ICD-10-CM | POA: Diagnosis not present

## 2023-04-02 DIAGNOSIS — R260 Ataxic gait: Secondary | ICD-10-CM | POA: Diagnosis not present

## 2023-04-02 DIAGNOSIS — M6281 Muscle weakness (generalized): Secondary | ICD-10-CM | POA: Diagnosis not present

## 2023-04-02 DIAGNOSIS — R262 Difficulty in walking, not elsewhere classified: Secondary | ICD-10-CM | POA: Diagnosis not present

## 2023-04-07 DIAGNOSIS — L570 Actinic keratosis: Secondary | ICD-10-CM | POA: Diagnosis not present

## 2023-04-07 DIAGNOSIS — D044 Carcinoma in situ of skin of scalp and neck: Secondary | ICD-10-CM | POA: Diagnosis not present

## 2023-04-07 DIAGNOSIS — L821 Other seborrheic keratosis: Secondary | ICD-10-CM | POA: Diagnosis not present

## 2023-04-07 DIAGNOSIS — M6281 Muscle weakness (generalized): Secondary | ICD-10-CM | POA: Diagnosis not present

## 2023-04-07 DIAGNOSIS — R260 Ataxic gait: Secondary | ICD-10-CM | POA: Diagnosis not present

## 2023-04-07 DIAGNOSIS — R262 Difficulty in walking, not elsewhere classified: Secondary | ICD-10-CM | POA: Diagnosis not present

## 2023-04-07 DIAGNOSIS — Z85828 Personal history of other malignant neoplasm of skin: Secondary | ICD-10-CM | POA: Diagnosis not present

## 2023-04-07 DIAGNOSIS — D0439 Carcinoma in situ of skin of other parts of face: Secondary | ICD-10-CM | POA: Diagnosis not present

## 2023-04-07 DIAGNOSIS — L814 Other melanin hyperpigmentation: Secondary | ICD-10-CM | POA: Diagnosis not present

## 2023-04-09 DIAGNOSIS — R262 Difficulty in walking, not elsewhere classified: Secondary | ICD-10-CM | POA: Diagnosis not present

## 2023-04-09 DIAGNOSIS — R260 Ataxic gait: Secondary | ICD-10-CM | POA: Diagnosis not present

## 2023-04-09 DIAGNOSIS — M6281 Muscle weakness (generalized): Secondary | ICD-10-CM | POA: Diagnosis not present

## 2023-04-14 DIAGNOSIS — R260 Ataxic gait: Secondary | ICD-10-CM | POA: Diagnosis not present

## 2023-04-14 DIAGNOSIS — R262 Difficulty in walking, not elsewhere classified: Secondary | ICD-10-CM | POA: Diagnosis not present

## 2023-04-14 DIAGNOSIS — M6281 Muscle weakness (generalized): Secondary | ICD-10-CM | POA: Diagnosis not present

## 2023-04-16 DIAGNOSIS — M6281 Muscle weakness (generalized): Secondary | ICD-10-CM | POA: Diagnosis not present

## 2023-04-16 DIAGNOSIS — R260 Ataxic gait: Secondary | ICD-10-CM | POA: Diagnosis not present

## 2023-04-16 DIAGNOSIS — R262 Difficulty in walking, not elsewhere classified: Secondary | ICD-10-CM | POA: Diagnosis not present

## 2023-04-29 DIAGNOSIS — M810 Age-related osteoporosis without current pathological fracture: Secondary | ICD-10-CM | POA: Diagnosis not present

## 2023-04-29 DIAGNOSIS — M79676 Pain in unspecified toe(s): Secondary | ICD-10-CM | POA: Diagnosis not present

## 2023-04-29 DIAGNOSIS — F419 Anxiety disorder, unspecified: Secondary | ICD-10-CM | POA: Diagnosis not present

## 2023-04-29 DIAGNOSIS — E538 Deficiency of other specified B group vitamins: Secondary | ICD-10-CM | POA: Diagnosis not present

## 2023-04-29 DIAGNOSIS — Z23 Encounter for immunization: Secondary | ICD-10-CM | POA: Diagnosis not present

## 2023-04-29 DIAGNOSIS — F339 Major depressive disorder, recurrent, unspecified: Secondary | ICD-10-CM | POA: Diagnosis not present

## 2023-04-29 DIAGNOSIS — E46 Unspecified protein-calorie malnutrition: Secondary | ICD-10-CM | POA: Diagnosis not present

## 2023-04-29 DIAGNOSIS — Z0000 Encounter for general adult medical examination without abnormal findings: Secondary | ICD-10-CM | POA: Diagnosis not present

## 2023-04-29 DIAGNOSIS — F32A Depression, unspecified: Secondary | ICD-10-CM | POA: Diagnosis not present

## 2023-04-29 DIAGNOSIS — H9193 Unspecified hearing loss, bilateral: Secondary | ICD-10-CM | POA: Diagnosis not present

## 2023-05-08 DIAGNOSIS — F419 Anxiety disorder, unspecified: Secondary | ICD-10-CM | POA: Diagnosis not present

## 2023-05-08 DIAGNOSIS — F339 Major depressive disorder, recurrent, unspecified: Secondary | ICD-10-CM | POA: Diagnosis not present

## 2023-05-19 DIAGNOSIS — R260 Ataxic gait: Secondary | ICD-10-CM | POA: Diagnosis not present

## 2023-05-19 DIAGNOSIS — R262 Difficulty in walking, not elsewhere classified: Secondary | ICD-10-CM | POA: Diagnosis not present

## 2023-05-19 DIAGNOSIS — M6281 Muscle weakness (generalized): Secondary | ICD-10-CM | POA: Diagnosis not present

## 2023-05-28 DIAGNOSIS — R262 Difficulty in walking, not elsewhere classified: Secondary | ICD-10-CM | POA: Diagnosis not present

## 2023-05-28 DIAGNOSIS — M6281 Muscle weakness (generalized): Secondary | ICD-10-CM | POA: Diagnosis not present

## 2023-05-28 DIAGNOSIS — R260 Ataxic gait: Secondary | ICD-10-CM | POA: Diagnosis not present

## 2023-06-02 DIAGNOSIS — R262 Difficulty in walking, not elsewhere classified: Secondary | ICD-10-CM | POA: Diagnosis not present

## 2023-06-02 DIAGNOSIS — M6281 Muscle weakness (generalized): Secondary | ICD-10-CM | POA: Diagnosis not present

## 2023-06-02 DIAGNOSIS — R260 Ataxic gait: Secondary | ICD-10-CM | POA: Diagnosis not present

## 2023-06-03 DIAGNOSIS — H9313 Tinnitus, bilateral: Secondary | ICD-10-CM | POA: Diagnosis not present

## 2023-06-03 DIAGNOSIS — H903 Sensorineural hearing loss, bilateral: Secondary | ICD-10-CM | POA: Diagnosis not present

## 2023-06-04 DIAGNOSIS — R260 Ataxic gait: Secondary | ICD-10-CM | POA: Diagnosis not present

## 2023-06-04 DIAGNOSIS — R262 Difficulty in walking, not elsewhere classified: Secondary | ICD-10-CM | POA: Diagnosis not present

## 2023-06-04 DIAGNOSIS — M6281 Muscle weakness (generalized): Secondary | ICD-10-CM | POA: Diagnosis not present

## 2023-06-16 DIAGNOSIS — M6281 Muscle weakness (generalized): Secondary | ICD-10-CM | POA: Diagnosis not present

## 2023-06-16 DIAGNOSIS — R262 Difficulty in walking, not elsewhere classified: Secondary | ICD-10-CM | POA: Diagnosis not present

## 2023-06-16 DIAGNOSIS — R260 Ataxic gait: Secondary | ICD-10-CM | POA: Diagnosis not present

## 2023-06-20 ENCOUNTER — Emergency Department (HOSPITAL_BASED_OUTPATIENT_CLINIC_OR_DEPARTMENT_OTHER): Payer: Medicare Other

## 2023-06-20 ENCOUNTER — Other Ambulatory Visit: Payer: Self-pay

## 2023-06-20 ENCOUNTER — Encounter (HOSPITAL_BASED_OUTPATIENT_CLINIC_OR_DEPARTMENT_OTHER): Payer: Self-pay

## 2023-06-20 ENCOUNTER — Emergency Department (HOSPITAL_BASED_OUTPATIENT_CLINIC_OR_DEPARTMENT_OTHER)
Admission: EM | Admit: 2023-06-20 | Discharge: 2023-06-21 | Disposition: A | Discharge status: Home / Self Care | Payer: Medicare Other | Attending: Emergency Medicine | Admitting: Emergency Medicine

## 2023-06-20 DIAGNOSIS — Z7982 Long term (current) use of aspirin: Secondary | ICD-10-CM | POA: Diagnosis not present

## 2023-06-20 DIAGNOSIS — S32010A Wedge compression fracture of first lumbar vertebra, initial encounter for closed fracture: Secondary | ICD-10-CM | POA: Insufficient documentation

## 2023-06-20 DIAGNOSIS — M48061 Spinal stenosis, lumbar region without neurogenic claudication: Secondary | ICD-10-CM | POA: Diagnosis not present

## 2023-06-20 DIAGNOSIS — W19XXXA Unspecified fall, initial encounter: Secondary | ICD-10-CM | POA: Insufficient documentation

## 2023-06-20 DIAGNOSIS — M47812 Spondylosis without myelopathy or radiculopathy, cervical region: Secondary | ICD-10-CM | POA: Diagnosis not present

## 2023-06-20 DIAGNOSIS — M545 Low back pain, unspecified: Secondary | ICD-10-CM | POA: Diagnosis not present

## 2023-06-20 DIAGNOSIS — M419 Scoliosis, unspecified: Secondary | ICD-10-CM | POA: Diagnosis not present

## 2023-06-20 DIAGNOSIS — I6782 Cerebral ischemia: Secondary | ICD-10-CM | POA: Diagnosis not present

## 2023-06-20 DIAGNOSIS — M50221 Other cervical disc displacement at C4-C5 level: Secondary | ICD-10-CM | POA: Diagnosis not present

## 2023-06-20 DIAGNOSIS — I959 Hypotension, unspecified: Secondary | ICD-10-CM | POA: Diagnosis not present

## 2023-06-20 DIAGNOSIS — M5136 Other intervertebral disc degeneration, lumbar region with discogenic back pain only: Secondary | ICD-10-CM | POA: Diagnosis not present

## 2023-06-20 DIAGNOSIS — S0990XA Unspecified injury of head, initial encounter: Secondary | ICD-10-CM | POA: Insufficient documentation

## 2023-06-20 DIAGNOSIS — M47816 Spondylosis without myelopathy or radiculopathy, lumbar region: Secondary | ICD-10-CM | POA: Diagnosis not present

## 2023-06-20 DIAGNOSIS — S3992XA Unspecified injury of lower back, initial encounter: Secondary | ICD-10-CM | POA: Diagnosis present

## 2023-06-20 DIAGNOSIS — Z043 Encounter for examination and observation following other accident: Secondary | ICD-10-CM | POA: Diagnosis not present

## 2023-06-20 DIAGNOSIS — Z96641 Presence of right artificial hip joint: Secondary | ICD-10-CM | POA: Diagnosis not present

## 2023-06-20 DIAGNOSIS — S199XXA Unspecified injury of neck, initial encounter: Secondary | ICD-10-CM | POA: Diagnosis not present

## 2023-06-20 DIAGNOSIS — M4802 Spinal stenosis, cervical region: Secondary | ICD-10-CM | POA: Diagnosis not present

## 2023-06-20 MED ORDER — OXYCODONE-ACETAMINOPHEN 5-325 MG PO TABS: 1.0000 | ORAL_TABLET | Freq: Three times a day (TID) | ORAL | 0 refills | Status: AC | PRN

## 2023-06-20 MED ORDER — OXYCODONE-ACETAMINOPHEN 5-325 MG PO TABS
1.0000 | ORAL_TABLET | Freq: Once | ORAL | Status: AC
Start: 2023-06-20 — End: 2023-06-20
  Administered 2023-06-20: 1 via ORAL
  Filled 2023-06-20: qty 1

## 2023-06-20 NOTE — ED Provider Notes (Signed)
 [facility] Provider Note   CSN: [id number] Arrival date & time: 06/20/23  1500     History Chief Complaint  Patient presents with   Fall   Hip Pain   Back Pain    Marie Coleman is a 83 y.o. female.  Patient past history significant for osteoarthritis, avascular necrosis, and prior hip fracture presents the ER with concerns of mechanical fall.  She reports that she fell backwards while in an elevator and struck her back against the wall and landed on the floor.  Endorsing pain in her low back.  Denies any saddle paresthesia, tingling, numbness, or loss of bowel or bladder control.  Able to ambulate but in notable pain.  Patient denies any head strike or head injury.  Denies any neck pain.  Not currently on blood thinners.   Fall  Hip Pain  Back Pain      Home Medications Prior to Admission medications   Medication Sig Start Date End Date Taking? Authorizing Provider  oxyCODONE -acetaminophen  (PERCOCET/ROXICET) 5-325 MG tablet Take 1 tablet by mouth every 8 (eight) hours as needed for severe pain (pain score 7-10). 06/20/23  Yes Daquon Greenleaf A, PA-C  acetaminophen  (TYLENOL ) 500 MG tablet Take 500 mg by mouth daily as needed for moderate pain or headache. Reported on 06/25/2015    [provider]  aspirin 81 MG chewable tablet Chew 81 mg by mouth daily.    [provider]  atenolol (TENORMIN) 25 MG tablet Take 25 mg by mouth daily.    [provider]  calcium carbonate (TUMS - DOSED IN MG ELEMENTAL CALCIUM) 500 MG chewable tablet Chew 1 tablet by mouth daily as needed for indigestion or heartburn.    [provider]  Calcium-Vitamin A-Vitamin D  (LIQUID CALCIUM PO) Take 15 mLs by mouth daily.    [provider]  Multiple Vitamin (MULTIVITAMIN WITH MINERALS) TABS tablet Take 1 tablet by mouth daily.    [provider]  omeprazole (PRILOSEC) 20 MG capsule TAKE 1 CAPSULE BY MOUTH AT NIGHT AS  DIRECTED for 90 days    [provider]  sodium chloride  (OCEAN) 0.65 % nasal spray Place 1 spray into the nose as needed. Allergies    [provider]      Allergies    Sulfa antibiotics    Review of Systems   Review of Systems  Musculoskeletal:  Positive for back pain.  All other systems reviewed and are negative.   Physical Exam Updated Vital Signs BP 123/68   Pulse 77   Temp 98.4 F (36.9 C)   Resp 16   Ht 5' 6 (1.676 m)   Wt 48 kg   SpO2 96%   BMI 17.08 kg/m  Physical Exam Vitals and nursing note reviewed.  Constitutional:      General: She is not in acute distress.    Appearance: She is well-developed.  HENT:     Head: Normocephalic and atraumatic.  Eyes:     Conjunctiva/sclera: Conjunctivae normal.  Cardiovascular:     Rate and Rhythm: Normal rate and regular rhythm.     Heart sounds: No murmur heard. Pulmonary:     Effort: Pulmonary effort is normal. No respiratory distress.     Breath sounds: Normal breath sounds.  Abdominal:     Palpations: Abdomen is soft.     Tenderness: There is no abdominal tenderness.  Musculoskeletal:        General: Tenderness present. No swelling,  deformity or signs of injury.       Arms:     Cervical back: Neck supple.     Comments: Point tenderness in the upper low back.  No obvious bony deformity at this area.  No skin tenting seen.  Skin:    General: Skin is warm and dry.     Capillary Refill: Capillary refill takes less than 2 seconds.  Neurological:     Mental Status: She is alert.  Psychiatric:        Mood and Affect: Mood normal.     ED Results / Procedures / Treatments   Labs (all labs ordered are listed, but only abnormal results are displayed) Labs Reviewed - No data to display  EKG None  Radiology CT Lumbar Spine Wo Contrast Result Date: 06/20/2023 CLINICAL DATA:  Fall, low back pain EXAM: CT LUMBAR SPINE WITHOUT CONTRAST TECHNIQUE: Multidetector CT imaging of the lumbar spine was  performed without intravenous contrast administration. Multiplanar CT image reconstructions were also generated. RADIATION DOSE REDUCTION: This exam was performed according to the departmental dose-optimization program which includes automated exposure control, adjustment of the mA and/or kV according to patient size and/or use of iterative reconstruction technique. COMPARISON:  Plain films today.  CT abdomen and pelvis 12/10/2020 FINDINGS: Segmentation: 5 lumbar type vertebrae. Alignment: Normal. Vertebrae: Moderate compression fracture through the superior endplate and body of L1. 5 mm of retropulsed fracture fragments into the spinal canal. No additional acute fracture. Paraspinal and other soft tissues: Paraspinal soft tissues unremarkable. Disc levels: Diffuse disc space narrowing, vacuum disc, osteophyte formation, most pronounced from L2-3 through L5-S1. Moderate bilateral degenerative facet disease. IMPRESSION: Moderate L1 compression fracture with 5 mm retropulsed fracture fragments. Diffuse degenerative disc and facet disease. Electronically Signed   By: Franky Crease M.D.   On: 06/20/2023 21:26   DG Lumbar Spine 2-3 Views Result Date: 06/20/2023 CLINICAL DATA:  Fall EXAM: LUMBAR SPINE - 2-3 VIEW COMPARISON:  None Available. FINDINGS: Dextroscoliosis. Vertebral body heights are grossly maintained. Minimal superior endplate deformities at L1 and L2 of uncertain age. Advanced disc space narrowing and degenerative change at L2-L3, L3-L4, L4-L5 and L5-S1. Lower lumbar facet degenerative changes. IMPRESSION: 1. Minimal superior endplate deformities at L1 and L2 of uncertain age. Correlate for point tenderness. 2. Dextroscoliosis with advanced degenerative changes. Electronically Signed   By: Luke Bun M.D.   On: 06/20/2023 18:14   DG Pelvis 1-2 Views Result Date: 06/20/2023 CLINICAL DATA:  Fall EXAM: PELVIS - 1-2 VIEW COMPARISON:  04/20/2012 FINDINGS: SI joints are non widened. Pubic symphysis and rami  appear intact. Right hip replacement with intact hardware and normal alignment. IMPRESSION: No acute osseous abnormality. Electronically Signed   By: Luke Bun M.D.   On: 06/20/2023 18:13   CT Cervical Spine Wo Contrast Result Date: 06/20/2023 CLINICAL DATA:  Neck trauma EXAM: CT CERVICAL SPINE WITHOUT CONTRAST TECHNIQUE: Multidetector CT imaging of the cervical spine was performed without intravenous contrast. Multiplanar CT image reconstructions were also generated. RADIATION DOSE REDUCTION: This exam was performed according to the departmental dose-optimization program which includes automated exposure control, adjustment of the mA and/or kV according to patient size and/or use of iterative reconstruction technique. COMPARISON:  None Available. FINDINGS: Alignment: Trace retrolisthesis C4 on C5 and C5 on C6. Trace anterolisthesis C7 on T1. Facet alignment is within normal limits. Skull base and vertebrae: No acute fracture. No primary bone lesion or focal pathologic process. Soft tissues and spinal canal: No prevertebral fluid or swelling. No  visible canal hematoma. Disc levels: Multilevel degenerative change with advanced diffuse disc space narrowing throughout. Partial ankylosis at C3-C4. Facet degenerative changes at multiple levels with foraminal narrowing. Clips at the left facets at C5 and C6. Upper chest: Apical scarring Other: None IMPRESSION: Degenerative changes without acute osseous abnormality. Electronically Signed   By: Luke Bun M.D.   On: 06/20/2023 18:12   CT Head Wo Contrast Result Date: 06/20/2023 CLINICAL DATA:  Head trauma EXAM: CT HEAD WITHOUT CONTRAST TECHNIQUE: Contiguous axial images were obtained from the base of the skull through the vertex without intravenous contrast. RADIATION DOSE REDUCTION: This exam was performed according to the departmental dose-optimization program which includes automated exposure control, adjustment of the mA and/or kV according to patient size  and/or use of iterative reconstruction technique. COMPARISON:  Head CT 06/25/2015 FINDINGS: Brain: No acute territorial infarction, hemorrhage or intracranial mass. Mild atrophy. Minimal chronic small vessel ischemic changes of the white matter. Nonenlarged ventricles. Vascular: No hyperdense vessels.  No unexpected calcification Skull: Normal. Negative for fracture or focal lesion. Sinuses/Orbits: No acute finding. Other: None IMPRESSION: 1. No CT evidence for acute intracranial abnormality. 2. Mild atrophy and minimal chronic small vessel ischemic changes of the white matter. Electronically Signed   By: Luke Bun M.D.   On: 06/20/2023 18:03    Procedures Procedures    Medications Ordered in ED Medications  oxyCODONE -acetaminophen  (PERCOCET/ROXICET) 5-325 MG per tablet 1 tablet (1 tablet Oral Given 06/20/23 1928)  oxyCODONE -acetaminophen  (PERCOCET/ROXICET) 5-325 MG per tablet 1 tablet (1 tablet Oral Given 06/20/23 2204)    ED Course/ Medical Decision Making/ A&P                                 Medical Decision Making Amount and/or Complexity of Data Reviewed Radiology: ordered.  Risk Prescription drug management.   This patient presents to the ED for concern of fall, back pain.  Differential diagnosis includes compression fracture, mechanical fall, SAH, head injury, cervical strain   Imaging Studies ordered:  I ordered imaging studies including X-ray of the pelvis and lumbar spine, CT head, CT cervical spine, CT lumbar spine  I independently visualized and interpreted imaging which showed Minimal superior endplate deformities at L1 and L2 of uncertain age. Correlate for point tenderness.  CT head and CT cervical spine unremarkable for any acute injuries.  CT lumbar spine does show an acute compression deformity at the L1 lumbar spine with 5 mm retropulsion of fracture fragments. I agree with the radiologist interpretation   Medicines ordered and prescription drug management:  I  ordered medication including Percocet for pain Reevaluation of the patient after these medicines showed that the patient improved I have reviewed the patients home medicines and have made adjustments as needed   Problem List / ED Course:  Patient presents to the emergency department concerns of a mechanical fall.  She reportedly fell while in an elevator and using her walker and lost her balance striking the wall behind her right side down to the floor landing on her buttock.  She reports a mild head strike with the wall binder but denies loss of consciousness and endorsing headaches or vomiting at this time.  Primarily concerned no pain in her low back.  Not on blood thinners. On exam, there is point tenderness to the mid lumbar spine.  X-ray ridging ordered from triage for assessment of the lumbar spine as well as the pelvis which  showed concern for possible superior endplate deformity at the L1-L2 level of uncertain age.  Will have to proceed with CT imaging of the lumbar spine given focal tenderness.  CT head and CT cervical spine unremarkable. Ordered Percocet as patient is not nave to this medication is tolerated well for pain control. CT of the lumbar spine does show evidence of compression deformity at the L1 level with 5 mm retropulsion of the fracture fragment. Added on repeat dose of Percocet for further pain control.  TLSO brace ordered for management of patient's lumbar spine fracture.  Advised patient, spouse, and son that she requires evaluation by neurosurgery/spine specialist for further evaluation of this.  Likely not to be managed surgically.  Patient is otherwise stable with no acute neurological deficits that concerning for emergent  surgical evaluation.  Patient discharged home with TLSO brace) as well as a prescription for Percocet sent to pharmacy for continued pain control.  Encourage patient to manage with Tylenol  or ibuprofen for mild to moderate pain as needed.  Strict return  precautions discussed.  Patient discharged home in stable condition.  Final Clinical Impression(s) / ED Diagnoses Final diagnoses:  Closed compression fracture of body of L1 vertebra (HCC)  Fall, initial encounter    Rx / DC Orders ED Discharge Orders          Ordered    oxyCODONE -acetaminophen  (PERCOCET/ROXICET) 5-325 MG tablet  Every 8 hours PRN        06/20/23 2305              Kela Baccari A, PA-C 06/20/23 2326    Francesca Elsie CROME, MD 06/21/23 [phone]

## 2023-06-20 NOTE — ED Triage Notes (Signed)
 The patient fell backwards and hit her head. No LOC. She is having low back pain and pelvis pain. No blood thinner.

## 2023-06-20 NOTE — Progress Notes (Signed)
 Orthopedic Tech Progress Note Patient Details:  Marie Coleman [birth year] [id number] Ordered TLSO brace for patient through [facility] Patient ID: Marie Coleman, female   DOB: [birth year], 83 y.o.   MRN: [id number]  Marie Coleman 06/20/2023, 9:30 PM

## 2023-06-20 NOTE — ED Notes (Signed)
 Patient placed on [place] due to back injury and TLSO brace enroute. Patient is also a full assist when using the bathroom.

## 2023-06-20 NOTE — ED Notes (Signed)
 Attempted to call report x 2 to assisted living.

## 2023-06-20 NOTE — ED Notes (Signed)
 Attempted to call report to assisted living x 1

## 2023-06-20 NOTE — Discharge Instructions (Signed)
 You were seen in the emergency department today with concerns of a fall.  You unfortunately appear to have sustained a compression fracture of the L1 vertebrae.  You are provided with a TLSO brace for additional support.  A prescription for Percocet has been sent to your pharmacy for pain control.  For mild to moderate pain, you may take Tylenol  ibuprofen as needed.  Please plan on following up with a orthopedic/spine specialist for further evaluation.  If you have any new or worsening symptoms such as development of numbness, tingling, weakness, please return to the emergency department.

## 2023-06-20 NOTE — ED Triage Notes (Signed)
 BIB [facility] Pt was at the levator , using a walker, lost her balance and slid to the floor. Right elbow skin tear. Landed on buttock .  Alert and oriented x 4 per [facility]

## 2023-06-20 NOTE — ED Notes (Signed)
 Patient transported to CT

## 2023-06-23 DIAGNOSIS — I679 Cerebrovascular disease, unspecified: Secondary | ICD-10-CM | POA: Diagnosis not present

## 2023-06-23 DIAGNOSIS — M51369 Other intervertebral disc degeneration, lumbar region without mention of lumbar back pain or lower extremity pain: Secondary | ICD-10-CM | POA: Diagnosis not present

## 2023-06-23 DIAGNOSIS — S32010D Wedge compression fracture of first lumbar vertebra, subsequent encounter for fracture with routine healing: Secondary | ICD-10-CM | POA: Diagnosis not present

## 2023-06-23 DIAGNOSIS — K5901 Slow transit constipation: Secondary | ICD-10-CM | POA: Diagnosis not present

## 2023-06-23 DIAGNOSIS — M545 Low back pain, unspecified: Secondary | ICD-10-CM | POA: Diagnosis not present

## 2023-06-23 DIAGNOSIS — Y92009 Unspecified place in unspecified non-institutional (private) residence as the place of occurrence of the external cause: Secondary | ICD-10-CM | POA: Diagnosis not present

## 2023-06-23 DIAGNOSIS — W19XXXA Unspecified fall, initial encounter: Secondary | ICD-10-CM | POA: Diagnosis not present

## 2023-06-23 DIAGNOSIS — R54 Age-related physical debility: Secondary | ICD-10-CM | POA: Diagnosis not present

## 2023-07-08 DIAGNOSIS — M5459 Other low back pain: Secondary | ICD-10-CM | POA: Diagnosis not present

## 2023-07-08 DIAGNOSIS — M4856XD Collapsed vertebra, not elsewhere classified, lumbar region, subsequent encounter for fracture with routine healing: Secondary | ICD-10-CM | POA: Diagnosis not present

## 2023-07-17 DIAGNOSIS — M5451 Vertebrogenic low back pain: Secondary | ICD-10-CM | POA: Diagnosis not present

## 2023-07-17 DIAGNOSIS — M5459 Other low back pain: Secondary | ICD-10-CM | POA: Diagnosis not present

## 2023-07-21 DIAGNOSIS — W19XXXD Unspecified fall, subsequent encounter: Secondary | ICD-10-CM | POA: Diagnosis not present

## 2023-07-21 DIAGNOSIS — S32010D Wedge compression fracture of first lumbar vertebra, subsequent encounter for fracture with routine healing: Secondary | ICD-10-CM | POA: Diagnosis not present

## 2023-07-21 DIAGNOSIS — R262 Difficulty in walking, not elsewhere classified: Secondary | ICD-10-CM | POA: Diagnosis not present

## 2023-07-21 DIAGNOSIS — M545 Low back pain, unspecified: Secondary | ICD-10-CM | POA: Diagnosis not present

## 2023-07-21 DIAGNOSIS — R296 Repeated falls: Secondary | ICD-10-CM | POA: Diagnosis not present

## 2023-07-21 DIAGNOSIS — R2681 Unsteadiness on feet: Secondary | ICD-10-CM | POA: Diagnosis not present

## 2023-07-21 DIAGNOSIS — M6281 Muscle weakness (generalized): Secondary | ICD-10-CM | POA: Diagnosis not present

## 2023-07-22 ENCOUNTER — Other Ambulatory Visit (HOSPITAL_COMMUNITY): Payer: Self-pay | Admitting: Interventional Radiology

## 2023-07-22 ENCOUNTER — Telehealth (HOSPITAL_COMMUNITY): Payer: Self-pay

## 2023-07-22 DIAGNOSIS — M4856XD Collapsed vertebra, not elsewhere classified, lumbar region, subsequent encounter for fracture with routine healing: Secondary | ICD-10-CM | POA: Diagnosis not present

## 2023-07-22 DIAGNOSIS — R262 Difficulty in walking, not elsewhere classified: Secondary | ICD-10-CM | POA: Diagnosis not present

## 2023-07-22 DIAGNOSIS — M6281 Muscle weakness (generalized): Secondary | ICD-10-CM | POA: Diagnosis not present

## 2023-07-22 DIAGNOSIS — S32010A Wedge compression fracture of first lumbar vertebra, initial encounter for closed fracture: Secondary | ICD-10-CM

## 2023-07-22 DIAGNOSIS — W19XXXD Unspecified fall, subsequent encounter: Secondary | ICD-10-CM | POA: Diagnosis not present

## 2023-07-22 DIAGNOSIS — M545 Low back pain, unspecified: Secondary | ICD-10-CM | POA: Diagnosis not present

## 2023-07-22 DIAGNOSIS — R2681 Unsteadiness on feet: Secondary | ICD-10-CM | POA: Diagnosis not present

## 2023-07-22 DIAGNOSIS — S32010D Wedge compression fracture of first lumbar vertebra, subsequent encounter for fracture with routine healing: Secondary | ICD-10-CM | POA: Diagnosis not present

## 2023-07-22 NOTE — Telephone Encounter (Signed)
Ok per Dr. Deveshwar for L1 KP. AB  

## 2023-07-23 ENCOUNTER — Telehealth (HOSPITAL_COMMUNITY): Payer: Self-pay

## 2023-07-23 DIAGNOSIS — W19XXXD Unspecified fall, subsequent encounter: Secondary | ICD-10-CM | POA: Diagnosis not present

## 2023-07-23 DIAGNOSIS — R262 Difficulty in walking, not elsewhere classified: Secondary | ICD-10-CM | POA: Diagnosis not present

## 2023-07-23 DIAGNOSIS — R2681 Unsteadiness on feet: Secondary | ICD-10-CM | POA: Diagnosis not present

## 2023-07-23 DIAGNOSIS — M545 Low back pain, unspecified: Secondary | ICD-10-CM | POA: Diagnosis not present

## 2023-07-23 DIAGNOSIS — F419 Anxiety disorder, unspecified: Secondary | ICD-10-CM | POA: Diagnosis not present

## 2023-07-23 DIAGNOSIS — S32010D Wedge compression fracture of first lumbar vertebra, subsequent encounter for fracture with routine healing: Secondary | ICD-10-CM | POA: Diagnosis not present

## 2023-07-23 DIAGNOSIS — F339 Major depressive disorder, recurrent, unspecified: Secondary | ICD-10-CM | POA: Diagnosis not present

## 2023-07-23 DIAGNOSIS — M6281 Muscle weakness (generalized): Secondary | ICD-10-CM | POA: Diagnosis not present

## 2023-07-23 NOTE — Telephone Encounter (Signed)
 Left message for pt's daughter Ammon Bales to return call. AB

## 2023-07-24 DIAGNOSIS — R2681 Unsteadiness on feet: Secondary | ICD-10-CM | POA: Diagnosis not present

## 2023-07-24 DIAGNOSIS — R262 Difficulty in walking, not elsewhere classified: Secondary | ICD-10-CM | POA: Diagnosis not present

## 2023-07-24 DIAGNOSIS — S32010D Wedge compression fracture of first lumbar vertebra, subsequent encounter for fracture with routine healing: Secondary | ICD-10-CM | POA: Diagnosis not present

## 2023-07-24 DIAGNOSIS — W19XXXD Unspecified fall, subsequent encounter: Secondary | ICD-10-CM | POA: Diagnosis not present

## 2023-07-24 DIAGNOSIS — M545 Low back pain, unspecified: Secondary | ICD-10-CM | POA: Diagnosis not present

## 2023-07-24 DIAGNOSIS — M6281 Muscle weakness (generalized): Secondary | ICD-10-CM | POA: Diagnosis not present

## 2023-07-28 DIAGNOSIS — S32010D Wedge compression fracture of first lumbar vertebra, subsequent encounter for fracture with routine healing: Secondary | ICD-10-CM | POA: Diagnosis not present

## 2023-07-28 DIAGNOSIS — W19XXXD Unspecified fall, subsequent encounter: Secondary | ICD-10-CM | POA: Diagnosis not present

## 2023-07-28 DIAGNOSIS — R2681 Unsteadiness on feet: Secondary | ICD-10-CM | POA: Diagnosis not present

## 2023-07-28 DIAGNOSIS — M545 Low back pain, unspecified: Secondary | ICD-10-CM | POA: Diagnosis not present

## 2023-07-28 DIAGNOSIS — M6281 Muscle weakness (generalized): Secondary | ICD-10-CM | POA: Diagnosis not present

## 2023-07-28 DIAGNOSIS — R262 Difficulty in walking, not elsewhere classified: Secondary | ICD-10-CM | POA: Diagnosis not present

## 2023-07-29 DIAGNOSIS — M6281 Muscle weakness (generalized): Secondary | ICD-10-CM | POA: Diagnosis not present

## 2023-07-29 DIAGNOSIS — M545 Low back pain, unspecified: Secondary | ICD-10-CM | POA: Diagnosis not present

## 2023-07-29 DIAGNOSIS — R2681 Unsteadiness on feet: Secondary | ICD-10-CM | POA: Diagnosis not present

## 2023-07-29 DIAGNOSIS — S32010D Wedge compression fracture of first lumbar vertebra, subsequent encounter for fracture with routine healing: Secondary | ICD-10-CM | POA: Diagnosis not present

## 2023-07-29 DIAGNOSIS — R262 Difficulty in walking, not elsewhere classified: Secondary | ICD-10-CM | POA: Diagnosis not present

## 2023-07-29 DIAGNOSIS — W19XXXD Unspecified fall, subsequent encounter: Secondary | ICD-10-CM | POA: Diagnosis not present

## 2023-07-30 ENCOUNTER — Telehealth (HOSPITAL_COMMUNITY): Payer: Self-pay

## 2023-07-30 DIAGNOSIS — W19XXXD Unspecified fall, subsequent encounter: Secondary | ICD-10-CM | POA: Diagnosis not present

## 2023-07-30 DIAGNOSIS — S32010D Wedge compression fracture of first lumbar vertebra, subsequent encounter for fracture with routine healing: Secondary | ICD-10-CM | POA: Diagnosis not present

## 2023-07-30 DIAGNOSIS — R262 Difficulty in walking, not elsewhere classified: Secondary | ICD-10-CM | POA: Diagnosis not present

## 2023-07-30 DIAGNOSIS — R2681 Unsteadiness on feet: Secondary | ICD-10-CM | POA: Diagnosis not present

## 2023-07-30 DIAGNOSIS — M545 Low back pain, unspecified: Secondary | ICD-10-CM | POA: Diagnosis not present

## 2023-07-30 DIAGNOSIS — M6281 Muscle weakness (generalized): Secondary | ICD-10-CM | POA: Diagnosis not present

## 2023-07-30 NOTE — Telephone Encounter (Signed)
Received a referral for KP from Dr. Ethelene Hal. After review by Dr. Corliss Skains, pt is not a candidate for this procedure. Pt's daughter Misty Stanley made aware. AB

## 2023-07-31 DIAGNOSIS — M545 Low back pain, unspecified: Secondary | ICD-10-CM | POA: Diagnosis not present

## 2023-07-31 DIAGNOSIS — R2681 Unsteadiness on feet: Secondary | ICD-10-CM | POA: Diagnosis not present

## 2023-07-31 DIAGNOSIS — W19XXXD Unspecified fall, subsequent encounter: Secondary | ICD-10-CM | POA: Diagnosis not present

## 2023-07-31 DIAGNOSIS — M6281 Muscle weakness (generalized): Secondary | ICD-10-CM | POA: Diagnosis not present

## 2023-07-31 DIAGNOSIS — R262 Difficulty in walking, not elsewhere classified: Secondary | ICD-10-CM | POA: Diagnosis not present

## 2023-07-31 DIAGNOSIS — S32010D Wedge compression fracture of first lumbar vertebra, subsequent encounter for fracture with routine healing: Secondary | ICD-10-CM | POA: Diagnosis not present

## 2023-08-04 DIAGNOSIS — R2681 Unsteadiness on feet: Secondary | ICD-10-CM | POA: Diagnosis not present

## 2023-08-04 DIAGNOSIS — S32010D Wedge compression fracture of first lumbar vertebra, subsequent encounter for fracture with routine healing: Secondary | ICD-10-CM | POA: Diagnosis not present

## 2023-08-04 DIAGNOSIS — M6281 Muscle weakness (generalized): Secondary | ICD-10-CM | POA: Diagnosis not present

## 2023-08-04 DIAGNOSIS — M545 Low back pain, unspecified: Secondary | ICD-10-CM | POA: Diagnosis not present

## 2023-08-04 DIAGNOSIS — W19XXXD Unspecified fall, subsequent encounter: Secondary | ICD-10-CM | POA: Diagnosis not present

## 2023-08-04 DIAGNOSIS — R262 Difficulty in walking, not elsewhere classified: Secondary | ICD-10-CM | POA: Diagnosis not present

## 2023-08-05 DIAGNOSIS — W19XXXD Unspecified fall, subsequent encounter: Secondary | ICD-10-CM | POA: Diagnosis not present

## 2023-08-05 DIAGNOSIS — S32010D Wedge compression fracture of first lumbar vertebra, subsequent encounter for fracture with routine healing: Secondary | ICD-10-CM | POA: Diagnosis not present

## 2023-08-05 DIAGNOSIS — M6281 Muscle weakness (generalized): Secondary | ICD-10-CM | POA: Diagnosis not present

## 2023-08-05 DIAGNOSIS — R262 Difficulty in walking, not elsewhere classified: Secondary | ICD-10-CM | POA: Diagnosis not present

## 2023-08-05 DIAGNOSIS — M545 Low back pain, unspecified: Secondary | ICD-10-CM | POA: Diagnosis not present

## 2023-08-05 DIAGNOSIS — R2681 Unsteadiness on feet: Secondary | ICD-10-CM | POA: Diagnosis not present

## 2023-08-06 DIAGNOSIS — R262 Difficulty in walking, not elsewhere classified: Secondary | ICD-10-CM | POA: Diagnosis not present

## 2023-08-06 DIAGNOSIS — M6281 Muscle weakness (generalized): Secondary | ICD-10-CM | POA: Diagnosis not present

## 2023-08-06 DIAGNOSIS — R2681 Unsteadiness on feet: Secondary | ICD-10-CM | POA: Diagnosis not present

## 2023-08-06 DIAGNOSIS — W19XXXD Unspecified fall, subsequent encounter: Secondary | ICD-10-CM | POA: Diagnosis not present

## 2023-08-06 DIAGNOSIS — M545 Low back pain, unspecified: Secondary | ICD-10-CM | POA: Diagnosis not present

## 2023-08-06 DIAGNOSIS — S32010D Wedge compression fracture of first lumbar vertebra, subsequent encounter for fracture with routine healing: Secondary | ICD-10-CM | POA: Diagnosis not present

## 2023-08-07 DIAGNOSIS — M6281 Muscle weakness (generalized): Secondary | ICD-10-CM | POA: Diagnosis not present

## 2023-08-07 DIAGNOSIS — S32010D Wedge compression fracture of first lumbar vertebra, subsequent encounter for fracture with routine healing: Secondary | ICD-10-CM | POA: Diagnosis not present

## 2023-08-07 DIAGNOSIS — R2681 Unsteadiness on feet: Secondary | ICD-10-CM | POA: Diagnosis not present

## 2023-08-07 DIAGNOSIS — M545 Low back pain, unspecified: Secondary | ICD-10-CM | POA: Diagnosis not present

## 2023-08-07 DIAGNOSIS — R262 Difficulty in walking, not elsewhere classified: Secondary | ICD-10-CM | POA: Diagnosis not present

## 2023-08-07 DIAGNOSIS — W19XXXD Unspecified fall, subsequent encounter: Secondary | ICD-10-CM | POA: Diagnosis not present

## 2023-08-11 DIAGNOSIS — M545 Low back pain, unspecified: Secondary | ICD-10-CM | POA: Diagnosis not present

## 2023-08-11 DIAGNOSIS — R262 Difficulty in walking, not elsewhere classified: Secondary | ICD-10-CM | POA: Diagnosis not present

## 2023-08-11 DIAGNOSIS — S32010D Wedge compression fracture of first lumbar vertebra, subsequent encounter for fracture with routine healing: Secondary | ICD-10-CM | POA: Diagnosis not present

## 2023-08-11 DIAGNOSIS — M6281 Muscle weakness (generalized): Secondary | ICD-10-CM | POA: Diagnosis not present

## 2023-08-11 DIAGNOSIS — R2681 Unsteadiness on feet: Secondary | ICD-10-CM | POA: Diagnosis not present

## 2023-08-11 DIAGNOSIS — W19XXXD Unspecified fall, subsequent encounter: Secondary | ICD-10-CM | POA: Diagnosis not present

## 2023-08-12 DIAGNOSIS — M6281 Muscle weakness (generalized): Secondary | ICD-10-CM | POA: Diagnosis not present

## 2023-08-12 DIAGNOSIS — R2681 Unsteadiness on feet: Secondary | ICD-10-CM | POA: Diagnosis not present

## 2023-08-12 DIAGNOSIS — W19XXXD Unspecified fall, subsequent encounter: Secondary | ICD-10-CM | POA: Diagnosis not present

## 2023-08-12 DIAGNOSIS — M545 Low back pain, unspecified: Secondary | ICD-10-CM | POA: Diagnosis not present

## 2023-08-12 DIAGNOSIS — S32010D Wedge compression fracture of first lumbar vertebra, subsequent encounter for fracture with routine healing: Secondary | ICD-10-CM | POA: Diagnosis not present

## 2023-08-12 DIAGNOSIS — R262 Difficulty in walking, not elsewhere classified: Secondary | ICD-10-CM | POA: Diagnosis not present

## 2023-08-13 DIAGNOSIS — R2681 Unsteadiness on feet: Secondary | ICD-10-CM | POA: Diagnosis not present

## 2023-08-13 DIAGNOSIS — M6281 Muscle weakness (generalized): Secondary | ICD-10-CM | POA: Diagnosis not present

## 2023-08-13 DIAGNOSIS — S32010D Wedge compression fracture of first lumbar vertebra, subsequent encounter for fracture with routine healing: Secondary | ICD-10-CM | POA: Diagnosis not present

## 2023-08-13 DIAGNOSIS — M545 Low back pain, unspecified: Secondary | ICD-10-CM | POA: Diagnosis not present

## 2023-08-13 DIAGNOSIS — R262 Difficulty in walking, not elsewhere classified: Secondary | ICD-10-CM | POA: Diagnosis not present

## 2023-08-13 DIAGNOSIS — W19XXXD Unspecified fall, subsequent encounter: Secondary | ICD-10-CM | POA: Diagnosis not present

## 2023-08-14 DIAGNOSIS — R262 Difficulty in walking, not elsewhere classified: Secondary | ICD-10-CM | POA: Diagnosis not present

## 2023-08-14 DIAGNOSIS — S32010D Wedge compression fracture of first lumbar vertebra, subsequent encounter for fracture with routine healing: Secondary | ICD-10-CM | POA: Diagnosis not present

## 2023-08-14 DIAGNOSIS — W19XXXD Unspecified fall, subsequent encounter: Secondary | ICD-10-CM | POA: Diagnosis not present

## 2023-08-14 DIAGNOSIS — M545 Low back pain, unspecified: Secondary | ICD-10-CM | POA: Diagnosis not present

## 2023-08-14 DIAGNOSIS — M6281 Muscle weakness (generalized): Secondary | ICD-10-CM | POA: Diagnosis not present

## 2023-08-14 DIAGNOSIS — R2681 Unsteadiness on feet: Secondary | ICD-10-CM | POA: Diagnosis not present

## 2023-08-18 DIAGNOSIS — R2681 Unsteadiness on feet: Secondary | ICD-10-CM | POA: Diagnosis not present

## 2023-08-18 DIAGNOSIS — S32010D Wedge compression fracture of first lumbar vertebra, subsequent encounter for fracture with routine healing: Secondary | ICD-10-CM | POA: Diagnosis not present

## 2023-08-18 DIAGNOSIS — M6281 Muscle weakness (generalized): Secondary | ICD-10-CM | POA: Diagnosis not present

## 2023-08-18 DIAGNOSIS — W19XXXD Unspecified fall, subsequent encounter: Secondary | ICD-10-CM | POA: Diagnosis not present

## 2023-08-18 DIAGNOSIS — R296 Repeated falls: Secondary | ICD-10-CM | POA: Diagnosis not present

## 2023-08-18 DIAGNOSIS — R262 Difficulty in walking, not elsewhere classified: Secondary | ICD-10-CM | POA: Diagnosis not present

## 2023-08-18 DIAGNOSIS — M545 Low back pain, unspecified: Secondary | ICD-10-CM | POA: Diagnosis not present

## 2023-08-19 DIAGNOSIS — R262 Difficulty in walking, not elsewhere classified: Secondary | ICD-10-CM | POA: Diagnosis not present

## 2023-08-19 DIAGNOSIS — W19XXXD Unspecified fall, subsequent encounter: Secondary | ICD-10-CM | POA: Diagnosis not present

## 2023-08-19 DIAGNOSIS — M545 Low back pain, unspecified: Secondary | ICD-10-CM | POA: Diagnosis not present

## 2023-08-19 DIAGNOSIS — M6281 Muscle weakness (generalized): Secondary | ICD-10-CM | POA: Diagnosis not present

## 2023-08-19 DIAGNOSIS — S32010D Wedge compression fracture of first lumbar vertebra, subsequent encounter for fracture with routine healing: Secondary | ICD-10-CM | POA: Diagnosis not present

## 2023-08-19 DIAGNOSIS — R296 Repeated falls: Secondary | ICD-10-CM | POA: Diagnosis not present

## 2023-08-20 DIAGNOSIS — W19XXXD Unspecified fall, subsequent encounter: Secondary | ICD-10-CM | POA: Diagnosis not present

## 2023-08-20 DIAGNOSIS — R296 Repeated falls: Secondary | ICD-10-CM | POA: Diagnosis not present

## 2023-08-20 DIAGNOSIS — M545 Low back pain, unspecified: Secondary | ICD-10-CM | POA: Diagnosis not present

## 2023-08-20 DIAGNOSIS — M6281 Muscle weakness (generalized): Secondary | ICD-10-CM | POA: Diagnosis not present

## 2023-08-20 DIAGNOSIS — R262 Difficulty in walking, not elsewhere classified: Secondary | ICD-10-CM | POA: Diagnosis not present

## 2023-08-20 DIAGNOSIS — S32010D Wedge compression fracture of first lumbar vertebra, subsequent encounter for fracture with routine healing: Secondary | ICD-10-CM | POA: Diagnosis not present

## 2023-08-21 DIAGNOSIS — M4856XD Collapsed vertebra, not elsewhere classified, lumbar region, subsequent encounter for fracture with routine healing: Secondary | ICD-10-CM | POA: Diagnosis not present

## 2023-08-25 DIAGNOSIS — M545 Low back pain, unspecified: Secondary | ICD-10-CM | POA: Diagnosis not present

## 2023-08-25 DIAGNOSIS — R262 Difficulty in walking, not elsewhere classified: Secondary | ICD-10-CM | POA: Diagnosis not present

## 2023-08-25 DIAGNOSIS — R296 Repeated falls: Secondary | ICD-10-CM | POA: Diagnosis not present

## 2023-08-25 DIAGNOSIS — S32010D Wedge compression fracture of first lumbar vertebra, subsequent encounter for fracture with routine healing: Secondary | ICD-10-CM | POA: Diagnosis not present

## 2023-08-25 DIAGNOSIS — W19XXXD Unspecified fall, subsequent encounter: Secondary | ICD-10-CM | POA: Diagnosis not present

## 2023-08-25 DIAGNOSIS — M6281 Muscle weakness (generalized): Secondary | ICD-10-CM | POA: Diagnosis not present

## 2023-08-26 DIAGNOSIS — M6281 Muscle weakness (generalized): Secondary | ICD-10-CM | POA: Diagnosis not present

## 2023-08-26 DIAGNOSIS — S32010D Wedge compression fracture of first lumbar vertebra, subsequent encounter for fracture with routine healing: Secondary | ICD-10-CM | POA: Diagnosis not present

## 2023-08-26 DIAGNOSIS — W19XXXD Unspecified fall, subsequent encounter: Secondary | ICD-10-CM | POA: Diagnosis not present

## 2023-08-26 DIAGNOSIS — R296 Repeated falls: Secondary | ICD-10-CM | POA: Diagnosis not present

## 2023-08-26 DIAGNOSIS — R262 Difficulty in walking, not elsewhere classified: Secondary | ICD-10-CM | POA: Diagnosis not present

## 2023-08-26 DIAGNOSIS — M545 Low back pain, unspecified: Secondary | ICD-10-CM | POA: Diagnosis not present

## 2023-08-27 DIAGNOSIS — M545 Low back pain, unspecified: Secondary | ICD-10-CM | POA: Diagnosis not present

## 2023-08-27 DIAGNOSIS — R262 Difficulty in walking, not elsewhere classified: Secondary | ICD-10-CM | POA: Diagnosis not present

## 2023-08-27 DIAGNOSIS — R296 Repeated falls: Secondary | ICD-10-CM | POA: Diagnosis not present

## 2023-08-27 DIAGNOSIS — W19XXXD Unspecified fall, subsequent encounter: Secondary | ICD-10-CM | POA: Diagnosis not present

## 2023-08-27 DIAGNOSIS — M6281 Muscle weakness (generalized): Secondary | ICD-10-CM | POA: Diagnosis not present

## 2023-08-27 DIAGNOSIS — S32010D Wedge compression fracture of first lumbar vertebra, subsequent encounter for fracture with routine healing: Secondary | ICD-10-CM | POA: Diagnosis not present

## 2023-08-28 DIAGNOSIS — M545 Low back pain, unspecified: Secondary | ICD-10-CM | POA: Diagnosis not present

## 2023-08-28 DIAGNOSIS — S32010D Wedge compression fracture of first lumbar vertebra, subsequent encounter for fracture with routine healing: Secondary | ICD-10-CM | POA: Diagnosis not present

## 2023-08-28 DIAGNOSIS — R296 Repeated falls: Secondary | ICD-10-CM | POA: Diagnosis not present

## 2023-08-28 DIAGNOSIS — M6281 Muscle weakness (generalized): Secondary | ICD-10-CM | POA: Diagnosis not present

## 2023-08-28 DIAGNOSIS — W19XXXD Unspecified fall, subsequent encounter: Secondary | ICD-10-CM | POA: Diagnosis not present

## 2023-08-28 DIAGNOSIS — R262 Difficulty in walking, not elsewhere classified: Secondary | ICD-10-CM | POA: Diagnosis not present

## 2023-09-01 DIAGNOSIS — R262 Difficulty in walking, not elsewhere classified: Secondary | ICD-10-CM | POA: Diagnosis not present

## 2023-09-01 DIAGNOSIS — R296 Repeated falls: Secondary | ICD-10-CM | POA: Diagnosis not present

## 2023-09-01 DIAGNOSIS — W19XXXD Unspecified fall, subsequent encounter: Secondary | ICD-10-CM | POA: Diagnosis not present

## 2023-09-01 DIAGNOSIS — S32010D Wedge compression fracture of first lumbar vertebra, subsequent encounter for fracture with routine healing: Secondary | ICD-10-CM | POA: Diagnosis not present

## 2023-09-01 DIAGNOSIS — M6281 Muscle weakness (generalized): Secondary | ICD-10-CM | POA: Diagnosis not present

## 2023-09-01 DIAGNOSIS — M545 Low back pain, unspecified: Secondary | ICD-10-CM | POA: Diagnosis not present

## 2023-09-02 DIAGNOSIS — S32010D Wedge compression fracture of first lumbar vertebra, subsequent encounter for fracture with routine healing: Secondary | ICD-10-CM | POA: Diagnosis not present

## 2023-09-02 DIAGNOSIS — R262 Difficulty in walking, not elsewhere classified: Secondary | ICD-10-CM | POA: Diagnosis not present

## 2023-09-02 DIAGNOSIS — R296 Repeated falls: Secondary | ICD-10-CM | POA: Diagnosis not present

## 2023-09-02 DIAGNOSIS — M6281 Muscle weakness (generalized): Secondary | ICD-10-CM | POA: Diagnosis not present

## 2023-09-02 DIAGNOSIS — M545 Low back pain, unspecified: Secondary | ICD-10-CM | POA: Diagnosis not present

## 2023-09-02 DIAGNOSIS — W19XXXD Unspecified fall, subsequent encounter: Secondary | ICD-10-CM | POA: Diagnosis not present

## 2023-09-03 DIAGNOSIS — M6281 Muscle weakness (generalized): Secondary | ICD-10-CM | POA: Diagnosis not present

## 2023-09-03 DIAGNOSIS — S32010D Wedge compression fracture of first lumbar vertebra, subsequent encounter for fracture with routine healing: Secondary | ICD-10-CM | POA: Diagnosis not present

## 2023-09-03 DIAGNOSIS — R260 Ataxic gait: Secondary | ICD-10-CM | POA: Diagnosis not present

## 2023-09-03 DIAGNOSIS — N390 Urinary tract infection, site not specified: Secondary | ICD-10-CM | POA: Diagnosis not present

## 2023-09-03 DIAGNOSIS — W19XXXD Unspecified fall, subsequent encounter: Secondary | ICD-10-CM | POA: Diagnosis not present

## 2023-09-03 DIAGNOSIS — R262 Difficulty in walking, not elsewhere classified: Secondary | ICD-10-CM | POA: Diagnosis not present

## 2023-09-03 DIAGNOSIS — M545 Low back pain, unspecified: Secondary | ICD-10-CM | POA: Diagnosis not present

## 2023-09-03 DIAGNOSIS — R296 Repeated falls: Secondary | ICD-10-CM | POA: Diagnosis not present

## 2023-09-04 DIAGNOSIS — M6281 Muscle weakness (generalized): Secondary | ICD-10-CM | POA: Diagnosis not present

## 2023-09-04 DIAGNOSIS — W19XXXD Unspecified fall, subsequent encounter: Secondary | ICD-10-CM | POA: Diagnosis not present

## 2023-09-04 DIAGNOSIS — M545 Low back pain, unspecified: Secondary | ICD-10-CM | POA: Diagnosis not present

## 2023-09-04 DIAGNOSIS — R296 Repeated falls: Secondary | ICD-10-CM | POA: Diagnosis not present

## 2023-09-04 DIAGNOSIS — S32010D Wedge compression fracture of first lumbar vertebra, subsequent encounter for fracture with routine healing: Secondary | ICD-10-CM | POA: Diagnosis not present

## 2023-09-04 DIAGNOSIS — R262 Difficulty in walking, not elsewhere classified: Secondary | ICD-10-CM | POA: Diagnosis not present

## 2023-09-08 DIAGNOSIS — R262 Difficulty in walking, not elsewhere classified: Secondary | ICD-10-CM | POA: Diagnosis not present

## 2023-09-08 DIAGNOSIS — S32010D Wedge compression fracture of first lumbar vertebra, subsequent encounter for fracture with routine healing: Secondary | ICD-10-CM | POA: Diagnosis not present

## 2023-09-08 DIAGNOSIS — M6281 Muscle weakness (generalized): Secondary | ICD-10-CM | POA: Diagnosis not present

## 2023-09-08 DIAGNOSIS — R296 Repeated falls: Secondary | ICD-10-CM | POA: Diagnosis not present

## 2023-09-08 DIAGNOSIS — M545 Low back pain, unspecified: Secondary | ICD-10-CM | POA: Diagnosis not present

## 2023-09-08 DIAGNOSIS — W19XXXD Unspecified fall, subsequent encounter: Secondary | ICD-10-CM | POA: Diagnosis not present

## 2023-09-10 DIAGNOSIS — M6281 Muscle weakness (generalized): Secondary | ICD-10-CM | POA: Diagnosis not present

## 2023-09-10 DIAGNOSIS — S32010D Wedge compression fracture of first lumbar vertebra, subsequent encounter for fracture with routine healing: Secondary | ICD-10-CM | POA: Diagnosis not present

## 2023-09-10 DIAGNOSIS — M545 Low back pain, unspecified: Secondary | ICD-10-CM | POA: Diagnosis not present

## 2023-09-10 DIAGNOSIS — R296 Repeated falls: Secondary | ICD-10-CM | POA: Diagnosis not present

## 2023-09-10 DIAGNOSIS — W19XXXD Unspecified fall, subsequent encounter: Secondary | ICD-10-CM | POA: Diagnosis not present

## 2023-09-10 DIAGNOSIS — R262 Difficulty in walking, not elsewhere classified: Secondary | ICD-10-CM | POA: Diagnosis not present

## 2023-09-11 DIAGNOSIS — R54 Age-related physical debility: Secondary | ICD-10-CM | POA: Diagnosis not present

## 2023-09-11 DIAGNOSIS — S32000D Wedge compression fracture of unspecified lumbar vertebra, subsequent encounter for fracture with routine healing: Secondary | ICD-10-CM | POA: Diagnosis not present

## 2023-09-11 DIAGNOSIS — M51369 Other intervertebral disc degeneration, lumbar region without mention of lumbar back pain or lower extremity pain: Secondary | ICD-10-CM | POA: Diagnosis not present

## 2023-09-11 DIAGNOSIS — K5901 Slow transit constipation: Secondary | ICD-10-CM | POA: Diagnosis not present

## 2023-09-11 DIAGNOSIS — Z681 Body mass index (BMI) 19 or less, adult: Secondary | ICD-10-CM | POA: Diagnosis not present

## 2023-09-11 DIAGNOSIS — E46 Unspecified protein-calorie malnutrition: Secondary | ICD-10-CM | POA: Diagnosis not present

## 2023-09-11 DIAGNOSIS — I679 Cerebrovascular disease, unspecified: Secondary | ICD-10-CM | POA: Diagnosis not present

## 2023-09-15 DIAGNOSIS — R296 Repeated falls: Secondary | ICD-10-CM | POA: Diagnosis not present

## 2023-09-15 DIAGNOSIS — W19XXXD Unspecified fall, subsequent encounter: Secondary | ICD-10-CM | POA: Diagnosis not present

## 2023-09-15 DIAGNOSIS — R262 Difficulty in walking, not elsewhere classified: Secondary | ICD-10-CM | POA: Diagnosis not present

## 2023-09-15 DIAGNOSIS — M545 Low back pain, unspecified: Secondary | ICD-10-CM | POA: Diagnosis not present

## 2023-09-15 DIAGNOSIS — M6281 Muscle weakness (generalized): Secondary | ICD-10-CM | POA: Diagnosis not present

## 2023-09-15 DIAGNOSIS — S32010D Wedge compression fracture of first lumbar vertebra, subsequent encounter for fracture with routine healing: Secondary | ICD-10-CM | POA: Diagnosis not present

## 2023-09-17 DIAGNOSIS — F419 Anxiety disorder, unspecified: Secondary | ICD-10-CM | POA: Diagnosis not present

## 2023-09-17 DIAGNOSIS — Z23 Encounter for immunization: Secondary | ICD-10-CM | POA: Diagnosis not present

## 2023-09-17 DIAGNOSIS — F339 Major depressive disorder, recurrent, unspecified: Secondary | ICD-10-CM | POA: Diagnosis not present

## 2023-09-22 DIAGNOSIS — R296 Repeated falls: Secondary | ICD-10-CM | POA: Diagnosis not present

## 2023-09-22 DIAGNOSIS — R262 Difficulty in walking, not elsewhere classified: Secondary | ICD-10-CM | POA: Diagnosis not present

## 2023-09-22 DIAGNOSIS — S32010D Wedge compression fracture of first lumbar vertebra, subsequent encounter for fracture with routine healing: Secondary | ICD-10-CM | POA: Diagnosis not present

## 2023-09-22 DIAGNOSIS — M6281 Muscle weakness (generalized): Secondary | ICD-10-CM | POA: Diagnosis not present

## 2023-09-23 DIAGNOSIS — K5901 Slow transit constipation: Secondary | ICD-10-CM | POA: Diagnosis not present

## 2023-09-23 DIAGNOSIS — Z681 Body mass index (BMI) 19 or less, adult: Secondary | ICD-10-CM | POA: Diagnosis not present

## 2023-09-23 DIAGNOSIS — M25551 Pain in right hip: Secondary | ICD-10-CM | POA: Diagnosis not present

## 2023-09-23 DIAGNOSIS — E46 Unspecified protein-calorie malnutrition: Secondary | ICD-10-CM | POA: Diagnosis not present

## 2023-09-24 DIAGNOSIS — S32010D Wedge compression fracture of first lumbar vertebra, subsequent encounter for fracture with routine healing: Secondary | ICD-10-CM | POA: Diagnosis not present

## 2023-09-24 DIAGNOSIS — R296 Repeated falls: Secondary | ICD-10-CM | POA: Diagnosis not present

## 2023-09-24 DIAGNOSIS — M6281 Muscle weakness (generalized): Secondary | ICD-10-CM | POA: Diagnosis not present

## 2023-09-24 DIAGNOSIS — R262 Difficulty in walking, not elsewhere classified: Secondary | ICD-10-CM | POA: Diagnosis not present

## 2023-09-29 DIAGNOSIS — S32010D Wedge compression fracture of first lumbar vertebra, subsequent encounter for fracture with routine healing: Secondary | ICD-10-CM | POA: Diagnosis not present

## 2023-09-29 DIAGNOSIS — M6281 Muscle weakness (generalized): Secondary | ICD-10-CM | POA: Diagnosis not present

## 2023-09-29 DIAGNOSIS — R262 Difficulty in walking, not elsewhere classified: Secondary | ICD-10-CM | POA: Diagnosis not present

## 2023-09-29 DIAGNOSIS — R296 Repeated falls: Secondary | ICD-10-CM | POA: Diagnosis not present

## 2023-10-01 DIAGNOSIS — R296 Repeated falls: Secondary | ICD-10-CM | POA: Diagnosis not present

## 2023-10-01 DIAGNOSIS — M6281 Muscle weakness (generalized): Secondary | ICD-10-CM | POA: Diagnosis not present

## 2023-10-01 DIAGNOSIS — S32010D Wedge compression fracture of first lumbar vertebra, subsequent encounter for fracture with routine healing: Secondary | ICD-10-CM | POA: Diagnosis not present

## 2023-10-01 DIAGNOSIS — R262 Difficulty in walking, not elsewhere classified: Secondary | ICD-10-CM | POA: Diagnosis not present

## 2023-10-06 DIAGNOSIS — M6281 Muscle weakness (generalized): Secondary | ICD-10-CM | POA: Diagnosis not present

## 2023-10-06 DIAGNOSIS — R296 Repeated falls: Secondary | ICD-10-CM | POA: Diagnosis not present

## 2023-10-06 DIAGNOSIS — R262 Difficulty in walking, not elsewhere classified: Secondary | ICD-10-CM | POA: Diagnosis not present

## 2023-10-06 DIAGNOSIS — S32010D Wedge compression fracture of first lumbar vertebra, subsequent encounter for fracture with routine healing: Secondary | ICD-10-CM | POA: Diagnosis not present

## 2023-10-08 DIAGNOSIS — R296 Repeated falls: Secondary | ICD-10-CM | POA: Diagnosis not present

## 2023-10-08 DIAGNOSIS — R262 Difficulty in walking, not elsewhere classified: Secondary | ICD-10-CM | POA: Diagnosis not present

## 2023-10-08 DIAGNOSIS — M6281 Muscle weakness (generalized): Secondary | ICD-10-CM | POA: Diagnosis not present

## 2023-10-08 DIAGNOSIS — S32010D Wedge compression fracture of first lumbar vertebra, subsequent encounter for fracture with routine healing: Secondary | ICD-10-CM | POA: Diagnosis not present

## 2023-10-13 DIAGNOSIS — S32010D Wedge compression fracture of first lumbar vertebra, subsequent encounter for fracture with routine healing: Secondary | ICD-10-CM | POA: Diagnosis not present

## 2023-10-13 DIAGNOSIS — R262 Difficulty in walking, not elsewhere classified: Secondary | ICD-10-CM | POA: Diagnosis not present

## 2023-10-13 DIAGNOSIS — R296 Repeated falls: Secondary | ICD-10-CM | POA: Diagnosis not present

## 2023-10-13 DIAGNOSIS — M6281 Muscle weakness (generalized): Secondary | ICD-10-CM | POA: Diagnosis not present

## 2023-10-15 DIAGNOSIS — M6281 Muscle weakness (generalized): Secondary | ICD-10-CM | POA: Diagnosis not present

## 2023-10-15 DIAGNOSIS — S32010D Wedge compression fracture of first lumbar vertebra, subsequent encounter for fracture with routine healing: Secondary | ICD-10-CM | POA: Diagnosis not present

## 2023-10-15 DIAGNOSIS — R262 Difficulty in walking, not elsewhere classified: Secondary | ICD-10-CM | POA: Diagnosis not present

## 2023-10-15 DIAGNOSIS — R296 Repeated falls: Secondary | ICD-10-CM | POA: Diagnosis not present

## 2023-10-20 DIAGNOSIS — S32010D Wedge compression fracture of first lumbar vertebra, subsequent encounter for fracture with routine healing: Secondary | ICD-10-CM | POA: Diagnosis not present

## 2023-10-20 DIAGNOSIS — R262 Difficulty in walking, not elsewhere classified: Secondary | ICD-10-CM | POA: Diagnosis not present

## 2023-10-20 DIAGNOSIS — M6281 Muscle weakness (generalized): Secondary | ICD-10-CM | POA: Diagnosis not present

## 2023-10-20 DIAGNOSIS — R296 Repeated falls: Secondary | ICD-10-CM | POA: Diagnosis not present

## 2023-10-22 DIAGNOSIS — R262 Difficulty in walking, not elsewhere classified: Secondary | ICD-10-CM | POA: Diagnosis not present

## 2023-10-22 DIAGNOSIS — R296 Repeated falls: Secondary | ICD-10-CM | POA: Diagnosis not present

## 2023-10-22 DIAGNOSIS — M6281 Muscle weakness (generalized): Secondary | ICD-10-CM | POA: Diagnosis not present

## 2023-10-22 DIAGNOSIS — S32010D Wedge compression fracture of first lumbar vertebra, subsequent encounter for fracture with routine healing: Secondary | ICD-10-CM | POA: Diagnosis not present

## 2023-10-27 DIAGNOSIS — R262 Difficulty in walking, not elsewhere classified: Secondary | ICD-10-CM | POA: Diagnosis not present

## 2023-10-27 DIAGNOSIS — R296 Repeated falls: Secondary | ICD-10-CM | POA: Diagnosis not present

## 2023-10-27 DIAGNOSIS — M6281 Muscle weakness (generalized): Secondary | ICD-10-CM | POA: Diagnosis not present

## 2023-10-27 DIAGNOSIS — S32010D Wedge compression fracture of first lumbar vertebra, subsequent encounter for fracture with routine healing: Secondary | ICD-10-CM | POA: Diagnosis not present

## 2023-10-29 DIAGNOSIS — R262 Difficulty in walking, not elsewhere classified: Secondary | ICD-10-CM | POA: Diagnosis not present

## 2023-10-29 DIAGNOSIS — R296 Repeated falls: Secondary | ICD-10-CM | POA: Diagnosis not present

## 2023-10-29 DIAGNOSIS — S32010D Wedge compression fracture of first lumbar vertebra, subsequent encounter for fracture with routine healing: Secondary | ICD-10-CM | POA: Diagnosis not present

## 2023-10-29 DIAGNOSIS — M6281 Muscle weakness (generalized): Secondary | ICD-10-CM | POA: Diagnosis not present

## 2023-11-03 DIAGNOSIS — R296 Repeated falls: Secondary | ICD-10-CM | POA: Diagnosis not present

## 2023-11-03 DIAGNOSIS — R262 Difficulty in walking, not elsewhere classified: Secondary | ICD-10-CM | POA: Diagnosis not present

## 2023-11-03 DIAGNOSIS — M6281 Muscle weakness (generalized): Secondary | ICD-10-CM | POA: Diagnosis not present

## 2023-11-03 DIAGNOSIS — S32010D Wedge compression fracture of first lumbar vertebra, subsequent encounter for fracture with routine healing: Secondary | ICD-10-CM | POA: Diagnosis not present

## 2023-11-05 DIAGNOSIS — M6281 Muscle weakness (generalized): Secondary | ICD-10-CM | POA: Diagnosis not present

## 2023-11-05 DIAGNOSIS — S32010D Wedge compression fracture of first lumbar vertebra, subsequent encounter for fracture with routine healing: Secondary | ICD-10-CM | POA: Diagnosis not present

## 2023-11-05 DIAGNOSIS — R262 Difficulty in walking, not elsewhere classified: Secondary | ICD-10-CM | POA: Diagnosis not present

## 2023-11-05 DIAGNOSIS — R296 Repeated falls: Secondary | ICD-10-CM | POA: Diagnosis not present

## 2023-11-12 DIAGNOSIS — M6281 Muscle weakness (generalized): Secondary | ICD-10-CM | POA: Diagnosis not present

## 2023-11-12 DIAGNOSIS — S32010D Wedge compression fracture of first lumbar vertebra, subsequent encounter for fracture with routine healing: Secondary | ICD-10-CM | POA: Diagnosis not present

## 2023-11-12 DIAGNOSIS — R296 Repeated falls: Secondary | ICD-10-CM | POA: Diagnosis not present

## 2023-11-12 DIAGNOSIS — R262 Difficulty in walking, not elsewhere classified: Secondary | ICD-10-CM | POA: Diagnosis not present

## 2023-11-14 DIAGNOSIS — R54 Age-related physical debility: Secondary | ICD-10-CM | POA: Diagnosis not present

## 2023-11-14 DIAGNOSIS — R5381 Other malaise: Secondary | ICD-10-CM | POA: Diagnosis not present

## 2023-11-14 DIAGNOSIS — E46 Unspecified protein-calorie malnutrition: Secondary | ICD-10-CM | POA: Diagnosis not present

## 2023-11-14 DIAGNOSIS — S32010D Wedge compression fracture of first lumbar vertebra, subsequent encounter for fracture with routine healing: Secondary | ICD-10-CM | POA: Diagnosis not present

## 2023-11-14 DIAGNOSIS — I679 Cerebrovascular disease, unspecified: Secondary | ICD-10-CM | POA: Diagnosis not present

## 2023-11-14 DIAGNOSIS — I872 Venous insufficiency (chronic) (peripheral): Secondary | ICD-10-CM | POA: Diagnosis not present

## 2023-11-14 DIAGNOSIS — M51369 Other intervertebral disc degeneration, lumbar region without mention of lumbar back pain or lower extremity pain: Secondary | ICD-10-CM | POA: Diagnosis not present

## 2023-11-14 DIAGNOSIS — Z681 Body mass index (BMI) 19 or less, adult: Secondary | ICD-10-CM | POA: Diagnosis not present

## 2023-11-14 DIAGNOSIS — W19XXXA Unspecified fall, initial encounter: Secondary | ICD-10-CM | POA: Diagnosis not present

## 2023-11-19 DIAGNOSIS — R262 Difficulty in walking, not elsewhere classified: Secondary | ICD-10-CM | POA: Diagnosis not present

## 2023-11-19 DIAGNOSIS — M6281 Muscle weakness (generalized): Secondary | ICD-10-CM | POA: Diagnosis not present

## 2023-11-24 DIAGNOSIS — F419 Anxiety disorder, unspecified: Secondary | ICD-10-CM | POA: Diagnosis not present

## 2023-11-24 DIAGNOSIS — F339 Major depressive disorder, recurrent, unspecified: Secondary | ICD-10-CM | POA: Diagnosis not present

## 2023-11-26 DIAGNOSIS — M6281 Muscle weakness (generalized): Secondary | ICD-10-CM | POA: Diagnosis not present

## 2023-11-26 DIAGNOSIS — R262 Difficulty in walking, not elsewhere classified: Secondary | ICD-10-CM | POA: Diagnosis not present

## 2023-12-01 DIAGNOSIS — M6281 Muscle weakness (generalized): Secondary | ICD-10-CM | POA: Diagnosis not present

## 2023-12-01 DIAGNOSIS — R262 Difficulty in walking, not elsewhere classified: Secondary | ICD-10-CM | POA: Diagnosis not present

## 2023-12-03 DIAGNOSIS — M6281 Muscle weakness (generalized): Secondary | ICD-10-CM | POA: Diagnosis not present

## 2023-12-03 DIAGNOSIS — R262 Difficulty in walking, not elsewhere classified: Secondary | ICD-10-CM | POA: Diagnosis not present

## 2023-12-04 DIAGNOSIS — E559 Vitamin D deficiency, unspecified: Secondary | ICD-10-CM | POA: Diagnosis not present

## 2023-12-04 DIAGNOSIS — E785 Hyperlipidemia, unspecified: Secondary | ICD-10-CM | POA: Diagnosis not present

## 2023-12-04 DIAGNOSIS — I10 Essential (primary) hypertension: Secondary | ICD-10-CM | POA: Diagnosis not present

## 2023-12-04 DIAGNOSIS — D649 Anemia, unspecified: Secondary | ICD-10-CM | POA: Diagnosis not present

## 2023-12-04 DIAGNOSIS — E119 Type 2 diabetes mellitus without complications: Secondary | ICD-10-CM | POA: Diagnosis not present

## 2023-12-05 DIAGNOSIS — W19XXXA Unspecified fall, initial encounter: Secondary | ICD-10-CM | POA: Diagnosis not present

## 2023-12-05 DIAGNOSIS — I872 Venous insufficiency (chronic) (peripheral): Secondary | ICD-10-CM | POA: Diagnosis not present

## 2023-12-05 DIAGNOSIS — R5381 Other malaise: Secondary | ICD-10-CM | POA: Diagnosis not present

## 2023-12-05 DIAGNOSIS — S32010D Wedge compression fracture of first lumbar vertebra, subsequent encounter for fracture with routine healing: Secondary | ICD-10-CM | POA: Diagnosis not present

## 2023-12-05 DIAGNOSIS — M51369 Other intervertebral disc degeneration, lumbar region without mention of lumbar back pain or lower extremity pain: Secondary | ICD-10-CM | POA: Diagnosis not present

## 2023-12-05 DIAGNOSIS — Z681 Body mass index (BMI) 19 or less, adult: Secondary | ICD-10-CM | POA: Diagnosis not present

## 2023-12-05 DIAGNOSIS — R54 Age-related physical debility: Secondary | ICD-10-CM | POA: Diagnosis not present

## 2023-12-05 DIAGNOSIS — E46 Unspecified protein-calorie malnutrition: Secondary | ICD-10-CM | POA: Diagnosis not present

## 2023-12-05 DIAGNOSIS — I679 Cerebrovascular disease, unspecified: Secondary | ICD-10-CM | POA: Diagnosis not present

## 2023-12-08 DIAGNOSIS — R262 Difficulty in walking, not elsewhere classified: Secondary | ICD-10-CM | POA: Diagnosis not present

## 2023-12-08 DIAGNOSIS — M6281 Muscle weakness (generalized): Secondary | ICD-10-CM | POA: Diagnosis not present

## 2023-12-08 DIAGNOSIS — L602 Onychogryphosis: Secondary | ICD-10-CM | POA: Diagnosis not present

## 2023-12-11 DIAGNOSIS — B351 Tinea unguium: Secondary | ICD-10-CM | POA: Diagnosis not present

## 2023-12-11 DIAGNOSIS — M79674 Pain in right toe(s): Secondary | ICD-10-CM | POA: Diagnosis not present

## 2023-12-11 DIAGNOSIS — M79675 Pain in left toe(s): Secondary | ICD-10-CM | POA: Diagnosis not present

## 2023-12-12 DIAGNOSIS — M6281 Muscle weakness (generalized): Secondary | ICD-10-CM | POA: Diagnosis not present

## 2023-12-12 DIAGNOSIS — R262 Difficulty in walking, not elsewhere classified: Secondary | ICD-10-CM | POA: Diagnosis not present

## 2023-12-15 DIAGNOSIS — R262 Difficulty in walking, not elsewhere classified: Secondary | ICD-10-CM | POA: Diagnosis not present

## 2023-12-15 DIAGNOSIS — M6281 Muscle weakness (generalized): Secondary | ICD-10-CM | POA: Diagnosis not present

## 2023-12-17 DIAGNOSIS — M6281 Muscle weakness (generalized): Secondary | ICD-10-CM | POA: Diagnosis not present

## 2023-12-22 DIAGNOSIS — M6281 Muscle weakness (generalized): Secondary | ICD-10-CM | POA: Diagnosis not present

## 2023-12-24 DIAGNOSIS — M6281 Muscle weakness (generalized): Secondary | ICD-10-CM | POA: Diagnosis not present

## 2023-12-25 DIAGNOSIS — M79671 Pain in right foot: Secondary | ICD-10-CM | POA: Diagnosis not present

## 2023-12-25 DIAGNOSIS — M79672 Pain in left foot: Secondary | ICD-10-CM | POA: Diagnosis not present

## 2023-12-29 DIAGNOSIS — F339 Major depressive disorder, recurrent, unspecified: Secondary | ICD-10-CM | POA: Diagnosis not present

## 2023-12-29 DIAGNOSIS — M6281 Muscle weakness (generalized): Secondary | ICD-10-CM | POA: Diagnosis not present

## 2023-12-29 DIAGNOSIS — F419 Anxiety disorder, unspecified: Secondary | ICD-10-CM | POA: Diagnosis not present

## 2023-12-31 DIAGNOSIS — M6281 Muscle weakness (generalized): Secondary | ICD-10-CM | POA: Diagnosis not present

## 2024-01-05 DIAGNOSIS — M6281 Muscle weakness (generalized): Secondary | ICD-10-CM | POA: Diagnosis not present

## 2024-01-07 DIAGNOSIS — M6281 Muscle weakness (generalized): Secondary | ICD-10-CM | POA: Diagnosis not present

## 2024-01-12 DIAGNOSIS — L309 Dermatitis, unspecified: Secondary | ICD-10-CM | POA: Diagnosis not present

## 2024-01-12 DIAGNOSIS — M6281 Muscle weakness (generalized): Secondary | ICD-10-CM | POA: Diagnosis not present

## 2024-01-14 DIAGNOSIS — M6281 Muscle weakness (generalized): Secondary | ICD-10-CM | POA: Diagnosis not present

## 2024-01-19 DIAGNOSIS — M6281 Muscle weakness (generalized): Secondary | ICD-10-CM | POA: Diagnosis not present

## 2024-01-19 DIAGNOSIS — R262 Difficulty in walking, not elsewhere classified: Secondary | ICD-10-CM | POA: Diagnosis not present

## 2024-01-21 DIAGNOSIS — M6281 Muscle weakness (generalized): Secondary | ICD-10-CM | POA: Diagnosis not present

## 2024-01-21 DIAGNOSIS — R262 Difficulty in walking, not elsewhere classified: Secondary | ICD-10-CM | POA: Diagnosis not present

## 2024-01-22 ENCOUNTER — Encounter: Payer: Medicare Other | Attending: Psychology | Admitting: Psychology

## 2024-01-22 ENCOUNTER — Encounter: Payer: Self-pay | Admitting: Psychology

## 2024-01-22 DIAGNOSIS — F0670 Mild neurocognitive disorder due to known physiological condition without behavioral disturbance: Secondary | ICD-10-CM | POA: Insufficient documentation

## 2024-01-22 DIAGNOSIS — F411 Generalized anxiety disorder: Secondary | ICD-10-CM | POA: Diagnosis not present

## 2024-01-22 NOTE — Progress Notes (Signed)
 Neuropsychological Consultation   Patient:   Marie Coleman   DOB:   July 01, 1940  MR Number:  [id number]  Location:  [facility] FOR PAIN AND REHABILITATIVE MEDICINE [facility] PHYSICAL MEDICINE AND REHABILITATION [address] [place], [address] [employer] [place] [address] Dept: [phone]           Date of Service:   01/22/2024  Location of Service and Individuals present: Today's visit was conducted in my outpatient clinic office with the patient, her son and myself present for this visit.  Today we spent 1 hour and 7 minutes in face-to-face clinical interview and the other 40 minutes or so was spent with records review, report writing and setting up testing protocols.  Start Time:   10 AM End Time:   12 PM  Patient Consent and Confidentiality: Limits of confidentiality were reviewed including the fact that this is a repeat neuropsychological consult with report to be made available in the patient's EMR and available to her treating medical team including neurology and PCP.  Review of the use of a digital scribe for this visit to assist in notetaking was also described and explained with the patient and her son consenting to proceed not only with the evaluative visit today but also the use of a digital scribe.  Consent for Evaluation and Treatment:  Signed:  Yes Explanation of Privacy Policies:  Signed:  Yes Discussion of Confidentiality Limits:  Yes  Provider/Observer:  Norleen Asa, Psy.D.       Clinical Neuropsychologist       Billing Code/Service: 96116/96121  Chief Complaint:     Chief Complaint  Patient presents with   Memory Loss   Anxiety   Gait Problem   Fall   Tremors   Spine Injury    Reason for Service:    Marie Coleman is an 83 year old female, previously seen in 2023/2024 after referral for neuropsychological evaluation as part of the larger neurological workup by Onetha Epp, MD, presenting for follow-up assessment due to concerns about worsening memory and  word-finding abilities.  The patient was diagnosed with mild neurocognitive disorder due to multiple etiologies and generalized anxiety disorder and the neuropsychological evaluation in 2023.  The patient has a past medical history including arthritis, osteoporosis, protein calorie malnutrition, anxiety, BPPV, fecal incontinence and essential tremor.  Patient had right hip surgery in 2012 and total knee replacement in 2013 and revision of previous hip surgery in 2013.  Reports a fall in January of 2025 resulting in an L1 compression fracture, which required a brace, assistance with activities, and a 15-day rehabilitation stay at [facility]. Subsequently moved with husband to an independent living apartment with 24/7 care at [facility] on 11/13/2023, and has resided there for four months.  Continues to be followed by social work at [facility] [employer] for anxiety symptoms. Has been treated at [facility] for mild depression and a history of anxiety for the past 18 months. Takes Mirtazapine 7.5 mg at bedtime for depression and anxiety, and Methylphenidate 5 mg one to two times daily for executive functioning and alertness. Recently, Methylphenidate is only being taken in the morning due to nursing schedule. Also uses a daily lidocaine  patch for back pain.  Has noted changes in gait, with very small, tentative steps, particularly in tight spaces, secondary to a fear of falling. This was observed during the visit. Works with a Adult nurse, Deatrice, twice weekly on strength and ambulation. Despite this, walking and motivation to  walk have reportedly deteriorated. Son reports an increasing frequency of expressive language difficulties, including word-finding issues for common objects. Is having increasing difficulty with simple functional tasks, such as operating the television.  Hand tremors have worsened, affecting texting, writing, and eating, requiring food to be  pre-cut. The tremor was observed during the visit and was previously evaluated by Dr. Buck, who diagnosed it as an essential tremor. There are reports of sending nonsensical text messages and increasing difficulty maintaining a train of thought. A formal hearing test confirmed hearing loss, and hearing aids are worn most of the time. There is no family history of progressive degenerative conditions recalled.  Participates in physical therapy activities twice a week with a therapist named Deatrice as well as having previous PT in 2021/2022.  The patient had previous neuropsychological evaluation conducted in 2023 and the background clinical interview from that visit can be found in the patient's EMR dated 12/04/2021 and the formal test results and interpretations can be found in her EMR dated 02/19/2022.  I have included a copy of the impressions from that evaluative report below for convenience and it can be found in its entirety in her EMR.  Impression/Diagnosis:                     Overall, the results of the current neuropsychological evaluation do show consistencies between the patient and her daughter's reports of current difficulties including retrieval of recently learned/experienced information, some difficulty keeping up and organizing her schedule, and varying memory difficulties with sometimes better than others.  Retrieval of people's names are noted.  The patient does show some indications of weakening visual spatial and visual constructional abilities but a denial of any geographic disorientation or clear visual spatial changes subjectively.  There are reports of difficulty with executive functioning to some degree in keeping up with ordered task or doing more than 1 task at a time or noted.  While the patient does have some tremors that are apparently attributed to essential tremor with family history of essential tremor there are no symptoms consistent with a Parkinson's-like condition.  The patient  has had some falls recently which are concerning.  The patient is adapting to these memory difficulties by utilizing calendars and reminders and is doing so somewhat effectively.  Of note, is the fact that the patient has been having more symptoms of anxiety with significant stressors during the COVID pandemic changes including her husband and developing an apparent degenerative dementia, moving into an assisted living with the patient being the primary caregiver of her husband, and the patient's daughter being diagnosed with significant medical issues herself.   As far as diagnostic considerations, the pattern of subjective symptoms, information available through medical records and objective neuropsychological assessment are not particularly consistent with degenerative condition such as Alzheimer's dementia, Lewy body dementia or other progressive neurological disorders.  There are no clear indications of cortical or subcortical types of degenerative process.  The patient has been having some degree of difficulty over the past several years and it does appear to be worsening to some degree.  The patient does show cognitive decline from predicted levels of premorbid functioning.  There are very limited indications of changes on MRI and those are generally consistent with age-related changes.  In this particular case, and will be necessary and essential for repeat testing to be conducted.  Anxiety and stress do appear to be playing a primary role here but we cannot completely rule out an  Alzheimer's type condition.  We will need repeat testing in 9 to 12 months for direct comparisons to her current level of performance to assess for progressive changes.  The patient would meet a diagnosis of mild neurocognitive changes and has indications of changes from premorbid functioning level and multiple cognitive domains.  Medical History:   Past Medical History:  Diagnosis Date   Arthritis    OA AND PAIN LEFT KNEE  AND PAIN IN SHOULDERS, HANDS, LOWER BACK   BPV (benign positional vertigo)    Osteoporosis    Seasonal allergies    Tremor    Vertigo          Patient Active Problem List   Diagnosis Date Noted   Cognitive decline 08/27/2021   Avascular necrosis of bone of hip (HCC) 04/20/2012   Postop Transfusion  09/26/2011   Postop Hyponatremia 09/26/2011   Postop Acute blood loss anemia 09/25/2011   OA (osteoarthritis) of knee 09/23/2011   Hip fracture, right (HCC) 05/14/2011    Class: Acute    Additional Tests and Measures from other records:  Neuroimaging Results: CT scan of head performed on 06/20/2023 after head trauma with fall included the impressions below with interpretation by Luke Bun M.D.   IMPRESSION: 1. No CT evidence for acute intracranial abnormality. 2. Mild atrophy and minimal chronic small vessel ischemic changes of the white matter.  MRI brain with and without contrast on/09/2021 included the following impression provided by Eduard Hanlon, MD  IMPRESSION:    MRI brain (with and without) demonstrating: - Mild periventricular and subcortical foci nonspecific gliosis.  - No acute findings.  Sleep:  Reports ability to sleep well if not for other issues. Can also nap during the day. Foot pain from a skin condition disrupts sleep. Was counseled on the importance of maintaining a regular sleep-wake cycle, avoiding daytime naps unless sleeping well at night, and getting sun exposure to support circadian rhythms.  Behavioral Observation/Mental Status:   Appeared appropriately dressed and groomed. Was cooperative and motivated during the session. Gait was notable for small, shuffling steps, and use of a rolling walker. Difficulty was observed when rising from a seated position. Attention and concentration appeared variable. Recent memory difficulties were reported, including trouble with word retrieval, paraphasic errors (e.g., fairway for hallway), and circumlocution.  Thought process was generally coherent and relevant, though train of thought was occasionally lost. No suicidal or homicidal ideation reported. Oriented to person and place. Judgment and insight appear limited regarding functional deficits.  TYARRA NOLTON  presents as a 83 y.o.-year-old Right handed Caucasian Female who appeared her stated age. her dress was Appropriate and she was Well Groomed and her manners were Appropriate to the situation.  her participation was indicative of Appropriate, Inattentive, and Redirectable behaviors.  There were physical disabilities noted.  she displayed an appropriate level of cooperation and motivation.    Interactions:    Active Appropriate  Attention:   abnormal and attention span appeared shorter than expected for age  Memory:   abnormal; remote memory intact, recent memory impaired  Visuo-spatial:   not examined  Speech (Volume):  low  Speech:   normal; word finding difficulties were noted during clinical interview including circumlocutions and paraphasic errors  Thought Process:  Coherent and Relevant  Circumstantial, Coherent, and Linear  Though Content:  WNL; not suicidal and not homicidal  Orientation:   person, place, and situation  Judgment:   Fair  Planning:   Poor  Affect:    Anxious  Mood:    Anxious  Insight:   Good  Intelligence:   normal  Marital Status/Living:  Resides with husband in an independent living apartment at [facility] since moving four months ago.  Current Employment:           The patient is retired.   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  Hobbies and interest have included yardwork, flowers, reading, walking, and engagement in sports activities including both playing and watching.   Substance Use:                     No concerns of substance abuse are reported.  The patient describes only occasional alcohol  use and no other substance use.    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  Extracurricular activities included basketball, tennis and golf.  Patient was her high school valedictorian.  Patient left college after getting married.  Psychiatric History:  Treated for mild depression and anxiety for the past 18 months at [facility] for mental health.  Family Med/Psych History:  Family History  Problem Relation Age of Onset   Dementia Mother    Heart attack Father    Tremor Neg Hx     Impression/DX:   This is an 83 year old female presenting for follow-up assessment due to concerns about worsening memory and word-finding abilities. Since the last evaluation, there has been a notable decline in several areas. Reports increased difficulty with word retrieval, paraphasic errors, and maintaining train of thought. Functionally, there is new difficulty with operating the television. Gait has become more tentative with smaller steps, and there is increased reliance on a rolling walker, though also ambulates without it at times. A fall in January 2025 resulted in an L1 compression fracture. Hand tremors, diagnosed as essential tremor, have worsened, impacting activities of daily living. Cognitive changes appear to be more vascular or age-related rather than indicative of a progressive degenerative condition like Parkinson's disease, given the absence of resting tremor.  Disposition/Plan:  Discussed the option of repeat neuropsychological testing to compare with the baseline from 2023/2024. The purpose would be to better understand the nature of the cognitive changes, determine if memory retrieval is improved with cueing, and provide tailored recommendations to improve daily functioning and quality of life. The patient and her son will discuss whether to  proceed with the evaluation. The clinical note from this visit will be made available on the patient portal for the family to review.  If the patient and family were to decide to conduct a repeat neuropsychological assessment the patient was initially administered the controlled oral Word Association test, Wechsler Adult Intelligence Scale and Wechsler Memory Scale's.  Recommended continued physical activity, including chair exercises to improve strength for standing, and consistent use of hearing aids. Advised having the physical therapist check and adjust the brakes on the rolling walker for safety.   Diagnosis:    Mild neurocognitive disorder due to multiple etiologies  Generalized anxiety disorder        Note: This document was prepared using Conservation officer, historic buildings  and may include unintentional dictation errors.   Electronically Signed   _______________________ Norleen Asa, Psy.D. Clinical Neuropsychologist

## 2024-01-26 DIAGNOSIS — K5900 Constipation, unspecified: Secondary | ICD-10-CM | POA: Diagnosis not present

## 2024-01-26 DIAGNOSIS — R262 Difficulty in walking, not elsewhere classified: Secondary | ICD-10-CM | POA: Diagnosis not present

## 2024-01-26 DIAGNOSIS — M6281 Muscle weakness (generalized): Secondary | ICD-10-CM | POA: Diagnosis not present

## 2024-01-28 DIAGNOSIS — M6281 Muscle weakness (generalized): Secondary | ICD-10-CM | POA: Diagnosis not present

## 2024-01-28 DIAGNOSIS — R262 Difficulty in walking, not elsewhere classified: Secondary | ICD-10-CM | POA: Diagnosis not present

## 2024-02-02 DIAGNOSIS — M6281 Muscle weakness (generalized): Secondary | ICD-10-CM | POA: Diagnosis not present

## 2024-02-02 DIAGNOSIS — R262 Difficulty in walking, not elsewhere classified: Secondary | ICD-10-CM | POA: Diagnosis not present

## 2024-02-04 DIAGNOSIS — R262 Difficulty in walking, not elsewhere classified: Secondary | ICD-10-CM | POA: Diagnosis not present

## 2024-02-04 DIAGNOSIS — M6281 Muscle weakness (generalized): Secondary | ICD-10-CM | POA: Diagnosis not present

## 2024-02-06 DIAGNOSIS — M51369 Other intervertebral disc degeneration, lumbar region without mention of lumbar back pain or lower extremity pain: Secondary | ICD-10-CM | POA: Diagnosis not present

## 2024-02-06 DIAGNOSIS — K5900 Constipation, unspecified: Secondary | ICD-10-CM | POA: Diagnosis not present

## 2024-02-06 DIAGNOSIS — Z7189 Other specified counseling: Secondary | ICD-10-CM | POA: Diagnosis not present

## 2024-02-09 DIAGNOSIS — R262 Difficulty in walking, not elsewhere classified: Secondary | ICD-10-CM | POA: Diagnosis not present

## 2024-02-09 DIAGNOSIS — M6281 Muscle weakness (generalized): Secondary | ICD-10-CM | POA: Diagnosis not present

## 2024-02-11 DIAGNOSIS — R262 Difficulty in walking, not elsewhere classified: Secondary | ICD-10-CM | POA: Diagnosis not present

## 2024-02-11 DIAGNOSIS — M6281 Muscle weakness (generalized): Secondary | ICD-10-CM | POA: Diagnosis not present

## 2024-02-16 DIAGNOSIS — M6281 Muscle weakness (generalized): Secondary | ICD-10-CM | POA: Diagnosis not present

## 2024-02-16 DIAGNOSIS — R262 Difficulty in walking, not elsewhere classified: Secondary | ICD-10-CM | POA: Diagnosis not present

## 2024-02-17 DIAGNOSIS — L814 Other melanin hyperpigmentation: Secondary | ICD-10-CM | POA: Diagnosis not present

## 2024-02-17 DIAGNOSIS — L821 Other seborrheic keratosis: Secondary | ICD-10-CM | POA: Diagnosis not present

## 2024-02-17 DIAGNOSIS — L570 Actinic keratosis: Secondary | ICD-10-CM | POA: Diagnosis not present

## 2024-02-18 DIAGNOSIS — M6281 Muscle weakness (generalized): Secondary | ICD-10-CM | POA: Diagnosis not present

## 2024-02-18 DIAGNOSIS — R262 Difficulty in walking, not elsewhere classified: Secondary | ICD-10-CM | POA: Diagnosis not present

## 2024-02-23 DIAGNOSIS — R262 Difficulty in walking, not elsewhere classified: Secondary | ICD-10-CM | POA: Diagnosis not present

## 2024-02-23 DIAGNOSIS — M6281 Muscle weakness (generalized): Secondary | ICD-10-CM | POA: Diagnosis not present

## 2024-02-25 DIAGNOSIS — M6281 Muscle weakness (generalized): Secondary | ICD-10-CM | POA: Diagnosis not present

## 2024-02-25 DIAGNOSIS — R262 Difficulty in walking, not elsewhere classified: Secondary | ICD-10-CM | POA: Diagnosis not present

## 2024-03-01 DIAGNOSIS — M25561 Pain in right knee: Secondary | ICD-10-CM | POA: Diagnosis not present

## 2024-03-01 DIAGNOSIS — M25571 Pain in right ankle and joints of right foot: Secondary | ICD-10-CM | POA: Diagnosis not present

## 2024-03-01 DIAGNOSIS — M79651 Pain in right thigh: Secondary | ICD-10-CM | POA: Diagnosis not present

## 2024-03-01 DIAGNOSIS — M79652 Pain in left thigh: Secondary | ICD-10-CM | POA: Diagnosis not present

## 2024-03-01 DIAGNOSIS — M79661 Pain in right lower leg: Secondary | ICD-10-CM | POA: Diagnosis not present

## 2024-03-01 DIAGNOSIS — R262 Difficulty in walking, not elsewhere classified: Secondary | ICD-10-CM | POA: Diagnosis not present

## 2024-03-01 DIAGNOSIS — M25551 Pain in right hip: Secondary | ICD-10-CM | POA: Diagnosis not present

## 2024-03-01 DIAGNOSIS — M6281 Muscle weakness (generalized): Secondary | ICD-10-CM | POA: Diagnosis not present

## 2024-03-01 DIAGNOSIS — Z96641 Presence of right artificial hip joint: Secondary | ICD-10-CM | POA: Diagnosis not present

## 2024-03-01 DIAGNOSIS — Y92129 Unspecified place in nursing home as the place of occurrence of the external cause: Secondary | ICD-10-CM | POA: Diagnosis not present

## 2024-03-01 DIAGNOSIS — M25572 Pain in left ankle and joints of left foot: Secondary | ICD-10-CM | POA: Diagnosis not present

## 2024-03-01 DIAGNOSIS — M25552 Pain in left hip: Secondary | ICD-10-CM | POA: Diagnosis not present

## 2024-03-01 DIAGNOSIS — M79662 Pain in left lower leg: Secondary | ICD-10-CM | POA: Diagnosis not present

## 2024-03-01 DIAGNOSIS — W19XXXA Unspecified fall, initial encounter: Secondary | ICD-10-CM | POA: Diagnosis not present

## 2024-03-01 DIAGNOSIS — M25562 Pain in left knee: Secondary | ICD-10-CM | POA: Diagnosis not present

## 2024-03-03 DIAGNOSIS — M6281 Muscle weakness (generalized): Secondary | ICD-10-CM | POA: Diagnosis not present

## 2024-03-03 DIAGNOSIS — R262 Difficulty in walking, not elsewhere classified: Secondary | ICD-10-CM | POA: Diagnosis not present

## 2024-03-08 DIAGNOSIS — Z0000 Encounter for general adult medical examination without abnormal findings: Secondary | ICD-10-CM | POA: Diagnosis not present

## 2024-03-08 DIAGNOSIS — I679 Cerebrovascular disease, unspecified: Secondary | ICD-10-CM | POA: Diagnosis not present

## 2024-03-08 DIAGNOSIS — Z1331 Encounter for screening for depression: Secondary | ICD-10-CM | POA: Diagnosis not present

## 2024-03-09 DIAGNOSIS — M6281 Muscle weakness (generalized): Secondary | ICD-10-CM | POA: Diagnosis not present

## 2024-03-09 DIAGNOSIS — R262 Difficulty in walking, not elsewhere classified: Secondary | ICD-10-CM | POA: Diagnosis not present

## 2024-03-11 DIAGNOSIS — R262 Difficulty in walking, not elsewhere classified: Secondary | ICD-10-CM | POA: Diagnosis not present

## 2024-03-11 DIAGNOSIS — M6281 Muscle weakness (generalized): Secondary | ICD-10-CM | POA: Diagnosis not present

## 2024-03-12 DIAGNOSIS — R41841 Cognitive communication deficit: Secondary | ICD-10-CM | POA: Diagnosis not present

## 2024-03-12 DIAGNOSIS — I679 Cerebrovascular disease, unspecified: Secondary | ICD-10-CM | POA: Diagnosis not present

## 2024-03-12 DIAGNOSIS — M51369 Other intervertebral disc degeneration, lumbar region without mention of lumbar back pain or lower extremity pain: Secondary | ICD-10-CM | POA: Diagnosis not present

## 2024-03-16 DIAGNOSIS — R262 Difficulty in walking, not elsewhere classified: Secondary | ICD-10-CM | POA: Diagnosis not present

## 2024-03-16 DIAGNOSIS — M6281 Muscle weakness (generalized): Secondary | ICD-10-CM | POA: Diagnosis not present

## 2024-03-18 DIAGNOSIS — R262 Difficulty in walking, not elsewhere classified: Secondary | ICD-10-CM | POA: Diagnosis not present

## 2024-03-18 DIAGNOSIS — Z23 Encounter for immunization: Secondary | ICD-10-CM | POA: Diagnosis not present

## 2024-03-18 DIAGNOSIS — F419 Anxiety disorder, unspecified: Secondary | ICD-10-CM | POA: Diagnosis not present

## 2024-03-18 DIAGNOSIS — M6281 Muscle weakness (generalized): Secondary | ICD-10-CM | POA: Diagnosis not present

## 2024-03-18 DIAGNOSIS — F339 Major depressive disorder, recurrent, unspecified: Secondary | ICD-10-CM | POA: Diagnosis not present

## 2024-03-23 DIAGNOSIS — L578 Other skin changes due to chronic exposure to nonionizing radiation: Secondary | ICD-10-CM | POA: Diagnosis not present

## 2024-03-23 DIAGNOSIS — L570 Actinic keratosis: Secondary | ICD-10-CM | POA: Diagnosis not present

## 2024-03-23 DIAGNOSIS — R262 Difficulty in walking, not elsewhere classified: Secondary | ICD-10-CM | POA: Diagnosis not present

## 2024-03-23 DIAGNOSIS — M6281 Muscle weakness (generalized): Secondary | ICD-10-CM | POA: Diagnosis not present

## 2024-03-25 DIAGNOSIS — M6281 Muscle weakness (generalized): Secondary | ICD-10-CM | POA: Diagnosis not present

## 2024-03-25 DIAGNOSIS — R262 Difficulty in walking, not elsewhere classified: Secondary | ICD-10-CM | POA: Diagnosis not present

## 2024-03-30 DIAGNOSIS — R262 Difficulty in walking, not elsewhere classified: Secondary | ICD-10-CM | POA: Diagnosis not present

## 2024-03-30 DIAGNOSIS — M6281 Muscle weakness (generalized): Secondary | ICD-10-CM | POA: Diagnosis not present

## 2024-04-01 DIAGNOSIS — R262 Difficulty in walking, not elsewhere classified: Secondary | ICD-10-CM | POA: Diagnosis not present

## 2024-04-01 DIAGNOSIS — M6281 Muscle weakness (generalized): Secondary | ICD-10-CM | POA: Diagnosis not present

## 2024-04-08 DIAGNOSIS — R262 Difficulty in walking, not elsewhere classified: Secondary | ICD-10-CM | POA: Diagnosis not present

## 2024-04-08 DIAGNOSIS — M6281 Muscle weakness (generalized): Secondary | ICD-10-CM | POA: Diagnosis not present

## 2024-04-13 DIAGNOSIS — R262 Difficulty in walking, not elsewhere classified: Secondary | ICD-10-CM | POA: Diagnosis not present

## 2024-04-13 DIAGNOSIS — M6281 Muscle weakness (generalized): Secondary | ICD-10-CM | POA: Diagnosis not present

## 2024-04-15 DIAGNOSIS — M6281 Muscle weakness (generalized): Secondary | ICD-10-CM | POA: Diagnosis not present

## 2024-04-15 DIAGNOSIS — R262 Difficulty in walking, not elsewhere classified: Secondary | ICD-10-CM | POA: Diagnosis not present

## 2024-04-20 DIAGNOSIS — L570 Actinic keratosis: Secondary | ICD-10-CM | POA: Diagnosis not present
